# Patient Record
Sex: Female | Born: 1970
Health system: Southern US, Community
[De-identification: ages and names within clinical notes are randomized; demographics above are authoritative.]

## PROBLEM LIST (undated history)

## (undated) DIAGNOSIS — F32A Depression, unspecified: Secondary | ICD-10-CM

## (undated) DIAGNOSIS — F329 Major depressive disorder, single episode, unspecified: Secondary | ICD-10-CM

## (undated) DIAGNOSIS — K589 Irritable bowel syndrome without diarrhea: Secondary | ICD-10-CM

## (undated) DIAGNOSIS — M5136 Other intervertebral disc degeneration, lumbar region: Secondary | ICD-10-CM

## (undated) DIAGNOSIS — R12 Heartburn: Secondary | ICD-10-CM

## (undated) DIAGNOSIS — T7840XA Allergy, unspecified, initial encounter: Secondary | ICD-10-CM

## (undated) DIAGNOSIS — M1990 Unspecified osteoarthritis, unspecified site: Secondary | ICD-10-CM

## (undated) DIAGNOSIS — I10 Essential (primary) hypertension: Secondary | ICD-10-CM

## (undated) DIAGNOSIS — F419 Anxiety disorder, unspecified: Secondary | ICD-10-CM

## (undated) DIAGNOSIS — E785 Hyperlipidemia, unspecified: Secondary | ICD-10-CM

## (undated) HISTORY — DX: Allergy, unspecified, initial encounter: T78.40XA

## (undated) HISTORY — PX: TUBAL LIGATION: SHX77

## (undated) HISTORY — DX: Irritable bowel syndrome, unspecified: K58.9

## (undated) HISTORY — DX: Unspecified osteoarthritis, unspecified site: M19.90

## (undated) HISTORY — DX: Depression, unspecified: F32.A

## (undated) HISTORY — DX: Anxiety disorder, unspecified: F41.9

## (undated) HISTORY — DX: Heartburn: R12

## (undated) HISTORY — DX: Hyperlipidemia, unspecified: E78.5

## (undated) HISTORY — DX: Other intervertebral disc degeneration, lumbar region: M51.36

---
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
## 1997-06-23 ENCOUNTER — Other Ambulatory Visit: Admission: RE | Admit: 1997-06-23 | Discharge: 1997-06-23 | Payer: Self-pay | Admitting: Family Medicine

## 1998-05-22 ENCOUNTER — Other Ambulatory Visit: Admission: RE | Admit: 1998-05-22 | Discharge: 1998-05-22 | Payer: Self-pay | Admitting: Obstetrics and Gynecology

## 1998-09-26 ENCOUNTER — Inpatient Hospital Stay (HOSPITAL_COMMUNITY): Admission: AD | Admit: 1998-09-26 | Discharge: 1998-09-26 | Payer: Self-pay | Admitting: Obstetrics & Gynecology

## 1999-03-20 ENCOUNTER — Inpatient Hospital Stay (HOSPITAL_COMMUNITY): Admission: AD | Admit: 1999-03-20 | Discharge: 1999-03-22 | Payer: Self-pay | Admitting: Obstetrics and Gynecology

## 1999-06-11 ENCOUNTER — Other Ambulatory Visit: Admission: RE | Admit: 1999-06-11 | Discharge: 1999-06-11 | Payer: Self-pay | Admitting: Obstetrics and Gynecology

## 1999-10-02 ENCOUNTER — Other Ambulatory Visit: Admission: RE | Admit: 1999-10-02 | Discharge: 1999-10-02 | Payer: Self-pay | Admitting: Obstetrics and Gynecology

## 2000-09-23 ENCOUNTER — Other Ambulatory Visit: Admission: RE | Admit: 2000-09-23 | Discharge: 2000-09-23 | Payer: Self-pay | Admitting: Obstetrics and Gynecology

## 2001-04-30 ENCOUNTER — Observation Stay (HOSPITAL_COMMUNITY): Admission: AD | Admit: 2001-04-30 | Discharge: 2001-04-30 | Payer: Self-pay | Admitting: Obstetrics and Gynecology

## 2001-07-14 ENCOUNTER — Ambulatory Visit (HOSPITAL_COMMUNITY): Admission: RE | Admit: 2001-07-14 | Discharge: 2001-07-14 | Payer: Self-pay | Admitting: Obstetrics and Gynecology

## 2001-07-14 ENCOUNTER — Encounter: Payer: Self-pay | Admitting: Obstetrics and Gynecology

## 2001-08-03 ENCOUNTER — Encounter: Payer: Self-pay | Admitting: Obstetrics and Gynecology

## 2001-08-03 ENCOUNTER — Ambulatory Visit (HOSPITAL_COMMUNITY): Admission: RE | Admit: 2001-08-03 | Discharge: 2001-08-03 | Payer: Self-pay | Admitting: Obstetrics and Gynecology

## 2001-09-09 ENCOUNTER — Ambulatory Visit (HOSPITAL_COMMUNITY): Admission: RE | Admit: 2001-09-09 | Discharge: 2001-09-09 | Payer: Self-pay | Admitting: Obstetrics and Gynecology

## 2001-09-09 ENCOUNTER — Encounter: Payer: Self-pay | Admitting: Obstetrics and Gynecology

## 2001-10-05 ENCOUNTER — Encounter: Payer: Self-pay | Admitting: Obstetrics and Gynecology

## 2001-10-05 ENCOUNTER — Ambulatory Visit (HOSPITAL_COMMUNITY): Admission: RE | Admit: 2001-10-05 | Discharge: 2001-10-05 | Payer: Self-pay | Admitting: Obstetrics and Gynecology

## 2001-10-19 ENCOUNTER — Inpatient Hospital Stay (HOSPITAL_COMMUNITY): Admission: AD | Admit: 2001-10-19 | Discharge: 2001-10-19 | Payer: Self-pay | Admitting: Obstetrics and Gynecology

## 2001-10-22 ENCOUNTER — Encounter: Payer: Self-pay | Admitting: Obstetrics and Gynecology

## 2001-10-22 ENCOUNTER — Ambulatory Visit (HOSPITAL_COMMUNITY): Admission: RE | Admit: 2001-10-22 | Discharge: 2001-10-22 | Payer: Self-pay | Admitting: Obstetrics and Gynecology

## 2001-10-24 ENCOUNTER — Inpatient Hospital Stay (HOSPITAL_COMMUNITY): Admission: AD | Admit: 2001-10-24 | Discharge: 2001-10-24 | Payer: Self-pay | Admitting: Obstetrics and Gynecology

## 2001-10-27 ENCOUNTER — Observation Stay (HOSPITAL_COMMUNITY): Admission: AD | Admit: 2001-10-27 | Discharge: 2001-10-28 | Payer: Self-pay | Admitting: Obstetrics and Gynecology

## 2001-11-02 ENCOUNTER — Encounter (HOSPITAL_COMMUNITY): Admission: RE | Admit: 2001-11-02 | Discharge: 2001-11-09 | Payer: Self-pay | Admitting: Obstetrics and Gynecology

## 2001-11-06 ENCOUNTER — Encounter: Payer: Self-pay | Admitting: Obstetrics and Gynecology

## 2001-11-14 ENCOUNTER — Inpatient Hospital Stay (HOSPITAL_COMMUNITY): Admission: AD | Admit: 2001-11-14 | Discharge: 2001-11-16 | Payer: Self-pay | Admitting: Obstetrics and Gynecology

## 2001-11-15 ENCOUNTER — Encounter (INDEPENDENT_AMBULATORY_CARE_PROVIDER_SITE_OTHER): Payer: Self-pay

## 2002-03-22 ENCOUNTER — Other Ambulatory Visit: Admission: RE | Admit: 2002-03-22 | Discharge: 2002-03-22 | Payer: Self-pay | Admitting: Obstetrics and Gynecology

## 2005-05-06 ENCOUNTER — Other Ambulatory Visit: Admission: RE | Admit: 2005-05-06 | Discharge: 2005-05-06 | Payer: Self-pay | Admitting: Obstetrics and Gynecology

## 2007-12-16 ENCOUNTER — Encounter: Admission: RE | Admit: 2007-12-16 | Discharge: 2007-12-16 | Payer: Self-pay | Admitting: Rheumatology

## 2009-02-18 DIAGNOSIS — M51369 Other intervertebral disc degeneration, lumbar region without mention of lumbar back pain or lower extremity pain: Secondary | ICD-10-CM

## 2009-02-18 DIAGNOSIS — M5136 Other intervertebral disc degeneration, lumbar region: Secondary | ICD-10-CM

## 2009-02-18 HISTORY — DX: Other intervertebral disc degeneration, lumbar region: M51.36

## 2009-02-18 HISTORY — DX: Other intervertebral disc degeneration, lumbar region without mention of lumbar back pain or lower extremity pain: M51.369

## 2010-07-06 NOTE — Op Note (Signed)
NAME:  Brandy [name], Brandy [name]                       ACCOUNT NO.:  [id number]   MEDICAL RECORD NO.:  [id number]                   PATIENT TYPE:  INP   LOCATION:  9138                                 FACILITY:  [facility]   PHYSICIAN:  Janine Limbo, M.D.            DATE OF BIRTH:  04-05-1970   DATE OF PROCEDURE:  11/14/2001  DATE OF DISCHARGE:                                 OPERATIVE REPORT   PREOPERATIVE DIAGNOSES:  1. Thirty-eight weeks' gestation.  2. Twin gestation.   POSTOPERATIVE DIAGNOSES:  1. Thirty-eight weeks' gestation.  2. Twin gestation.   PROCEDURE:  Normal spontaneous vaginal delivery of twins over an intact  perineum.   OBSTETRICIAN:  Janine Limbo, M.D.   ANESTHESIA:  IV Stadol.   DISPOSITION:  The patient is a 40 year old female, gravida 2, para 1-0-0-1,  who presents at 34 weeks' gestation with twins.  She has dilated her cervix  quickly and she is now brought to the operating room for twin vaginal  delivery.   FINDINGS:  The first infant born was a 6-pound 3-ounce female infant (Tyrese).  The Apgars were 8 at one minute and 9 at five minutes.  The infant was in an  occiput anterior presentation.  The second infant born was a 6-pound 6-ounce  female infant Essie Hart).  The Apgars were 8 at one minute and 9 at five  minutes.  This infant was in an occiput posterior position.  The umbilical  cord for both infants had three vessels.  There was one large placenta  present.  There was a membrane present between the infants.   PROCEDURE:  The patient was taken to the operating room where she was placed  in a lithotomy position.  The perineum and vagina were prepped with multiple  layers of Betadine and then sterilely draped.  The patient quickly delivered  the first infant.  The cord was clamped and cut and the infant was handed to  the waiting pediatric team.  Routine cord blood studies were obtained.  The  operator then guided the second fetal head into the  pelvis.  The membranes  were ruptured and the fluid was noted to be clear.  The patient then quickly  delivered the second infant without difficulty.  The cord was clamped and  cut and again, the infant was handed to the waiting pediatric team.  Routine  cord blood studies were obtained.  The placenta was removed without  difficulty.  The perineum and vagina were checked for lacerations and no  lacerations were present.  The patient did have brisk bleeding and fundal  massage was required in addition to IV Pitocin.  The estimated blood loss  was 800 cc.  The patient tolerated her procedure well.  She was taken back  to labor and delivery in stable condition.  The infants were taken to the  full term nursery in stable condition.  Janine Limbo, M.D.   AVS/MEDQ  D:  11/15/2001  T:  11/15/2001  Job:  [id number]

## 2010-07-06 NOTE — Discharge Summary (Signed)
   NAME:  Brandy [name], Brandy [name]                    ACCOUNT NO.:  [id number]   MEDICAL RECORD NO.:  [id number]                   PATIENT TYPE:  INP   LOCATION:  9163                                 [facility]ILITY:  [facility]   PHYSICIAN:  Janine Limbo, M.D.            DATE OF BIRTH:  December 30, 1970   DATE OF ADMISSION:  10/27/2001  DATE OF DISCHARGE:  10/28/2001                                 DISCHARGE SUMMARY   ADMITTING DIAGNOSES:  Intrauterine pregnancy twin gestation at 70 and 3/7  weeks, questionable early labor.   DISCHARGE DIAGNOSES:  Intrauterine pregnancy with twins at 54 and 4/7 weeks,  preterm contractions with no cervical change.   HISTORY OF PRESENT ILLNESS:  The patient is a 40 year old gravida 2, para 1-  0-0-1 who presented at 40 and 3/7 weeks with twin gestation and contractions  every three to four minutes for several hours.  Her cervix at that time was  noted to be 3-4 cm dilated and she was admitted for observation for labor.  Her babies have been reactive and reassuring twin A and twin B.  Her vital  signs have been stable.  Her contractions have slowed down and now are mild  and irregular.  Cervical examination per Dr. Stefano Gaul this morning was 3,  50%, -3 and as patient is not laboring and babies look reassuring, she is to  be discharged home.  The patient will follow up with her biweekly NSTs on  Friday and return to the office on Monday for a visit.  She will call for  any signs or symptoms of labor.     [name], C.N.M.               Janine Limbo, M.D.    SDM/MEDQ  D:  10/28/2001  T:  10/28/2001  Job:  [id number]

## 2010-07-06 NOTE — H&P (Signed)
NAME:  Brandy [name], Brandy [name]                       ACCOUNT NO.:  [id number]   MEDICAL RECORD NO.:  [id number]                   PATIENT TYPE:  OUT   LOCATION:  ULT                                  [facility]   PHYSICIAN:  Janine Limbo, M.D.            DATE OF BIRTH:  24-Jan-1971   DATE OF ADMISSION:  11/14/2001  DATE OF DISCHARGE:                                HISTORY & PHYSICAL   HISTORY OF PRESENT ILLNESS:  The patient is a 40 year old female gravida 2,  para 1-0-0-1 who presents at [redacted] weeks gestation (EDC is November 29, 2001)  for induction of labor.  The patient has been followed at [facility] and Gynecology for this pregnancy that has been complicated by a  twin gestation.  She complains of severe pelvic pressure and contractions.  She wants to proceed with induction.  The patient's most recent ultrasound  shows that the presenting infant is in a vertex presentation and the second  infant is in a transverse to breech presentation.   OBSTETRICAL HISTORY:  The patient had a vaginal delivery in 2001 of an 8  pound 1 ounce female infant at term.   ALLERGIES:  None known.   PAST MEDICAL HISTORY:  The patient denies hypertension and diabetes.  She  had her wisdom teeth removed in the past.   SOCIAL HISTORY:  The patient denied cigarette use, alcohol use, and  recreational drug use.   REVIEW OF SYMPTOMS:  Normal pregnancy complaints.   FAMILY HISTORY:  Noncontributory for this admission.   PHYSICAL EXAMINATION:  VITAL SIGNS:  Weight 175 pounds.  HEENT:  Within normal limits.  CHEST:  Clear.  HEART:  Regular rate and rhythm.  BREASTS:  Without masses.  ABDOMEN:  Gravid with a fundal height of 43 cm.  EXTREMITIES:  Within normal limits.  NEUROLOGIC:  Grossly normal.  PELVIC:  Cervix is 3 cm dilated, 90% effaced, and -1 in station.   LABORATORY VALUES:  Blood type O+.  Antibody screen negative.  VDRL  nonreactive.  Rubella immune.  HBSAG negative.   HIV nonreactive.  GC  negative.  Chlamydia negative.  Pap within normal limits.  Glucola screen is  92.  Third trimester beta Strep is negative.   ASSESSMENT:  1. A [redacted] week gestation.  2. Twin gestation with a vertex breech presentation.  3. Maternal pain in contractions.   PLAN:  The patient will be admitted for induction of labor.  The patient  understands the indications for her surgical procedure and she accepts the  risks of, but not limited to, anesthetic complications, bleeding,  infections, and possible damage to the surrounding organs.  She understands  that she may, in fact, be increasing her risk for cesarean delivery by  insisting upon induction of labor at this time.  She also understands that  there is a small risk that she will have a vaginal delivery and then  a  cesarean section for the delivery of her second infant.  She also  understands the increased risk of complications for her infant associated  with a potential breech vaginal delivery.                                               Janine Limbo, M.D.    AVS/MEDQ  D:  11/12/2001  T:  11/12/2001  Job:  [id number]

## 2010-07-06 NOTE — H&P (Signed)
[facility]  Patient:    Brandy [name]                     MRN: [id number] Adm. Date:  [id number] Attending:  Cleatrice Burke Dictator:   Vance Gather Duplantis, C.N.M.                         History and Physical  HISTORY OF PRESENT ILLNESS:   Ms. Brandy [name] is a 40 year old married black female, gravida 1, para 0, at 38-1/[redacted] weeks gestation who presents complaining of uterine contractions every four to five minutes for the last couple of hours.  She was een in the office earlier today and at that point was 3 cm, and 70% effaced, vertex at -2 station, with intact membranes and with contractions about every 10 minutes.  She had reported a small amount of bloody show this morning.  She denies any leaking or heavy vaginal bleeding.  She reports positive fetal movement.  She denies any nausea, vomiting, headaches, or visual disturbances.  Her pregnancy as been followed at [facility] and Gynecology by the M.D. service and has been essentially uncomplicated.  Her Group B Strep is negative.  OB/GYN HISTORY:               She is a primigravida.  She used Ortho-Tricyclen nd stopped that in April of 2000.  ALLERGIES:                    No known drug allergies.  PAST MEDICAL HISTORY:         She reports having had the usual childhood diseases. She reports having her wisdom teeth removed, but no other surgery.  FAMILY HISTORY:               Significant for maternal grandmother with hypertension requiring medications.  Maternal grandfather with colon cancer, maternal aunt with breast cancer.  GENETIC HISTORY:              Negative.  SOCIAL HISTORY:               She is married to Brandy [name] who is involved nd supportive.  They are both employed fulltime.  They deny any illicit drug use, alcohol, or smoking with this pregnancy.  PRENATAL LABORATORY DATA:     Her blood type is O positive, antibody screen is negative, syphilis is  negative, sickle cell is negative, hepatitis B surface antigen is negative, HIV nonreactive, GC and Chlamydia are both negative, and her rubella is positive.  Her 36-week Beta Strep was negative and a one-hour Glucola was also negative.  PHYSICAL EXAMINATION:  VITAL SIGNS:                  Her vital signs are stable.  She is afebrile.  HEENT:                        Within normal limits.  HEART:                        Regular rate and rhythm.  CHEST:                        Clear.  BREASTS:                      Soft and  nontender.  ABDOMEN:                      Gravid with uterine contractions every four to five minutes.  Her fetal heart rate is reassuring.  PELVIC:                       Her cervix is 4 cm, 80%, and vertex -1 with intact membranes.  EXTREMITIES:                  Within normal limits.  ASSESSMENT:                   1. Intrauterine pregnancy at term.                               2. Early active labor.                               3. Negative Group B Strep.  PLAN:                         To admit to labor and delivery to follow routine .D. orders and Cecilio Asper, M.D. has been notified of admission and will  follow the patient. DD:  03/20/99 TD:  03/20/99 Job: [id number]

## 2010-07-06 NOTE — Discharge Summary (Signed)
   Brandy [name], Brandy [name]                       ACCOUNT NO.:  [id number]   MEDICAL RECORD NO.:  [id number]                   PATIENT TYPE:  INP   LOCATION:  9138                                 [facility]   PHYSICIAN:  Janine Limbo, M.D.            DATE OF BIRTH:  Mar 29, 1970   DATE OF ADMISSION:  11/14/2001  DATE OF DISCHARGE:  11/16/2001                                 DISCHARGE SUMMARY   ADMISSION DIAGNOSES:  1. Intrauterine pregnancy with twins at [redacted] weeks gestation.  2. Desires permanent sterilization.   DISCHARGE DIAGNOSES:  1. Intrauterine pregnancy with twins at [redacted] weeks gestation.  2. Desires permanent sterilization.   HOSPITAL PROCEDURES:  1. Spinal anesthesia.  2. Spontaneous vaginal delivery of a twin gestation with twin A female named     Tyrese weighing 6 pounds 3 ounces, Apgars 8 and 9; and twin B female     named Tyrick weighing 6 pounds 6 ounces, Apgars 8 and 9.  3. Elective bilateral tubal ligation.   HOSPITAL COURSE:  The patient was admitted for induction of labor with term  twins.  The patient progressed to 4 cm later on that day with reassuring  fetal heart rates.  Amniotomy was performed, internal monitors were applied,  and within two hours the patient was delivered vaginally of twin boys over  an intact perineum with no complications.  Both infants did well and were  taken to the well nursery.  Postoperative day #1 the patient was doing well,  hemoglobin was 9.2, and she was desiring elective sterilization and was  taken to the operating room for a bilateral tubal ligation which was  performed under spinal anesthesia by Dr. Stefano Gaul with no complications on  postpartum day #2 and postoperative day #1.  The patient was doing well,  vital signs were stable, she was afebrile.  Chest was clear to auscultation,  heart rate regular rate and rhythm, abdomen was soft and appropriately  tender.  Umbilical incision was clean and intact.  Lochia was small.   The  perineum was intact, extremities within normal limits.  She was deemed to  have received the full benefit of her hospital stay and was discharged home.   DISCHARGE MEDICATIONS:  1. Motrin 600 mg p.o. q.6h. p.r.n.  2. Tylox one to two p.o. q.4h. p.r.n.  3. Prenatal vitamins one p.o. q.d.   DISCHARGE LABORATORY DATA:  White blood cell count 13.3, hemoglobin 9.2.,  hematocrit 28, platelets 274.  RPR nonreactive.   DISCHARGE INSTRUCTIONS:  Per [facility] handout.   DISCHARGE FOLLOW-UP:  In six weeks or p.r.n.    Marie L. Williams, C.N.M.                 Janine Limbo, M.D.   MLW/MEDQ  D:  11/16/2001  T:  11/17/2001  Job:  [id number]

## 2010-07-06 NOTE — H&P (Signed)
NAME:  Brandy, [name]                       ACCOUNT NO.:  [id number]   MEDICAL RECORD NO.:  [id number]                   PATIENT TYPE:  INP   LOCATION:  9163                                 [facility]   PHYSICIAN:  Hal Morales, M.D.             DATE OF BIRTH:  [birth year]   DATE OF ADMISSION:  10/27/2001  DATE OF DISCHARGE:                                HISTORY & PHYSICAL   HISTORY OF PRESENT ILLNESS:  The patient is a 40 year old gravida 2, para 1-  0-0-1, at 35-3/7 weeks with twin gestation who presented with uterine  contractions every three to four minutes for several hours and low back pain  with nausea and vomiting.  She was seen in maternity admission unit on  October 24, 2001.  Cervix was 1+, 70%, and vertex at -1.  She had an  ultrasound at 34 weeks that showed vertex/vertex presentation with twin B  being the presenting twin.  Both twins had growth in the 25th to 50th  percentile at that time.  The patient presented today for questionable  labor.  She was observed with initially cervix 3 to 4 cm, very posterior,  80%, very soft, vertex -1 with bulging bag of water.  Her contractions  spaced out significantly after her evaluation in maternity admission unit.  She then was walked for approximately 1 to 1-1/2 hours.  The cervix then  changed minimally to 4 cm, slightly more anterior, but contractions remained  6 to 8 minutes apart.  The decision was made after consultation with Dr.  Stefano Gaul and Dr. Pennie Rushing to admit the patient for observation and to observe  for status of labor as the night progresses.  Pregnancy has been remarkable  for:  1) Twin gestation.  2) Questionable LMP.  3) Elevated down syndrome  risk on AFP with amniocentesis declined.  4) Desires tubal sterilization.   PRENATAL LABORATORY DATA:  Blood type is O positive, Rh antibody negative,  VDRL nonreactive, rubella titer positive, hepatitis B surface antigen  negative, HIV nonreactive, GC and  Chlamydia cultures were negative, Pap was  normal.  Glucose Challenge was normal.  Hemoglobin upon entry into practice  was 13.7, it was 9.8 at 27 weeks.  Group B Strep culture was negative last  week.  She did have an elevated down syndrome risk on AFP but declined  amniocentesis.  EDC of November 29, 2001, was established by last menstrual  period, although, it was uncertain and was in agreement with ultrasound at  approximately 18 weeks.   HISTORY OF PRESENT PREGNANCY:  The patient entered care at approximately 8  weeks.  She had significant nausea in the early part of her pregnancy.  She  had an AFP at 16-5/7 weeks which was elevated down syndrome risk.  She had  an ultrasound in May that showed twin gestation.  Her cervix was normal.  They are anticipating a diamniotic monochorionic twins.  She declined  amniocentesis following an elevated down syndrome risk.  Twin A which is on  the right had an intracardiac echogenic focus on ultrasound.  She was noted  to have mild anemia at 27 weeks.  She had an ultrasound at approximately 28  weeks that showed normal growth in the 50th to 75th percentile in both twins  and an echogenic intracardiac focus on both twins.  She elected to have a  tubal ligation with that decision made at approximately 31 weeks.  She had  an ultrasound at 32 weeks that showed normal growth.  The cervix was 2.2 cm.  Fetal fibronectin was done at that time that was negative.  She was seen in  maternity admission unit on Saturday for a labor check.  Cervix was 1+, 70%,  and vertex at -1 station.  The decision was made at that point to not try to  stop labor should it occur.   PAST OBSTETRICAL HISTORY:  In January of 2001, she had a vaginal birth of a  female infant weight 8 pounds 1 ounce at 38 weeks.  She was in labor 12  hours.  She had Stadol.  She had no complications.   PAST MEDICAL HISTORY:  She was on OCPs in the past.  She has occasional  yeast infections.  She  reports usual childhood illnesses.  Her only other  surgery was wisdom teeth and only other hospitalization was for childbirth  and at age 78 she was hospitalized for possible ulcer.   ALLERGIES:  No known drug allergies.   FAMILY HISTORY:  Maternal grandmother, maternal aunt, and maternal uncle  have chronic hypertension. Her sister has anemia.  Her mother has some type  of thyroid problem. Her maternal grandfather had colon cancer.  Her maternal  aunt had breast cancer.   GENETIC HISTORY:  Unremarkable.   SOCIAL HISTORY:  The patient is married to the father of the baby.  He is  involved and supportive.  His name is Olive Bass.  The patient is  African-American and of the [facility] faith.  She has one year of college.  She was employed as a Associate Professor.  Her husband has Scientist, product/process development school  education and he is employed as a Naval architect.  She has been followed by  the physician service at [facility].  She denies any alcohol, drug,  or tobacco use during this pregnancy.   PHYSICAL EXAMINATION:  VITAL SIGNS:  Stable, the patient is afebrile.  HEENT:  Within normal limits.  LUNGS:  Bilateral breath sounds are clear.  HEART:  Regular rate and rhythm without murmur.  BREASTS:  Soft and nontender.  ABDOMEN:  Fundal height is approximately 38 cm.  Twin B is the presenting  twin in vertex presentation.  Twin A is also in a vertex presentation on the  maternal right with twin B being on the maternal left.  This was verified by  a bedside ultrasound.  Fetal heart rates are both reactive with no  decelerations.  Uterine contractions at this point are now every 6 to 10  minutes apart, mild to moderate quality.  PELVIC:  Cervical examination now is more anterior, 4 cm, 80%, vertex at -1  station, with slight bulging bag of water.  This is not a major change from  her previous examination, but does report slight change. EXTREMITIES:  Deep tendon reflexes are 2+ without clonus.  There  is trace  edema noted.   IMPRESSION:  1. Intrauterine  pregnancy with twins at 35-3/7 weeks.  2. Moderate status of advanced cervical change.  3. Persistent contractions, but questionable early versus prodromal labor.  4. Negative Group B Strep.   PLAN:  1. Admit to birthing suite for consult with Dr. Stefano Gaul and Dr. Pennie Rushing as     attending physician.  2. Decision is made to admit the patient for observation, IV fluid     hydration, and pain medication with therapeutic sedation should the     patient desire.  3. The patient will be reevaluated through the night on an as-needed basis     and in the morning for further management.  4. The patient is aware that the decision at this point is not to force     labor, but to allow labor to define itself at present given the twin     gestation and 35-3/7 weeks dating.  The patient and her husband are     agreeable with this plan and will accept being observed during the night.     Renaldo Reel Emilee Hero, C.N.M.                   Hal Morales, M.D.    Leeanne Mannan  D:  10/27/2001  T:  10/28/2001  Job:  [id number]

## 2010-07-06 NOTE — Op Note (Signed)
   NAME:  Brandy [name], Brandy [name]                       ACCOUNT NO.:  [id number]   MEDICAL RECORD NO.:  [id number]                   PATIENT TYPE:  INP   LOCATION:  9138                                 FACILITY:  [facility]   PHYSICIAN:  Janine Limbo, M.D.            DATE OF BIRTH:  [birth year]   DATE OF PROCEDURE:  11/15/2001  DATE OF DISCHARGE:                                 OPERATIVE REPORT   PREOPERATIVE DIAGNOSES:  1. Postpartum day #1.  2. Desires permanent sterilization.   POSTOPERATIVE DIAGNOSES:  1. Postpartum day #1.  2. Desires permanent sterilization.   PROCEDURE:  Modified [place] bilateral tubal ligation.   SURGEON:  Janine Limbo, M.D.   ANESTHESIA:  Spinal.   INDICATIONS:  The patient is a 40 year old female, now para 2-0-0-3, who had  a vaginal delivery of twins on November 14, 2001.  Both infants are doing  well.  The patient desires permanent sterilization.  She understands the  indications for her procedure, and she accepts the risks of, but not limited  to, anesthetic complications, bleeding, infections, possible damage to the  surrounding organs, and possible tubal failure (17/1000).   FINDINGS:  The fallopian tubes were normal bilaterally.   DESCRIPTION OF PROCEDURE:  The patient was taken to the operating room where  a spinal anesthetic was given.  The patient's abdomen was prepped with  multiple layers of Betadine and then sterilely draped.  Then, 5 cc of 0.25%  Marcaine with epinephrine were injected in the subumbilical area.  An  incision was made and carried sharply though the subcutaneous tissue, the  fascia, and the anterior peritoneum.  The left fallopian tube was identified  and followed to its fimbriated end.  A knuckle of tube was made on the left  using a free tie and then a tied of suture ligature of 0 plain catgut.  The  knuckle of tube thus made was excised.  Hemostasis was adequate.  The  opposite tube was handled in a similar fashion.   Again, hemostasis was  adequate.  The anterior peritoneum and the fascia were closed using a  running suture of 2-0 Vicryl.  The skin was reapproximated using a  subcuticular suture of 4-0 Vicryl.  Sponge, needle and instrument counts  were correct.  The estimated blood loss was less than 5 cc.  The patient  tolerated her procedure well.  She was taken to the recovery room in stable  condition.                                               Janine Limbo, M.D.    AVS/MEDQ  D:  11/15/2001  T:  11/15/2001  Job:  [id number]

## 2012-02-05 ENCOUNTER — Ambulatory Visit: Payer: Medicare HMO | Admitting: Family Medicine

## 2012-02-05 VITALS — BP 134/88 | HR 76 | Temp 98.5°F | Resp 16 | Ht 67.8 in | Wt 138.8 lb

## 2012-02-05 DIAGNOSIS — R21 Rash and other nonspecific skin eruption: Secondary | ICD-10-CM

## 2012-02-05 DIAGNOSIS — J029 Acute pharyngitis, unspecified: Secondary | ICD-10-CM

## 2012-02-05 LAB — POCT RAPID STREP A (OFFICE): Rapid Strep A Screen: NEGATIVE

## 2012-02-05 MED ORDER — PREDNISONE 20 MG PO TABS: ORAL_TABLET | ORAL | Status: DC

## 2012-02-05 NOTE — Progress Notes (Signed)
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 Throat is scratchy, she does have some flushing  No cough or wheezing, no fever Children 10 and 12  Objective:  NAD Oroph:  Reddened posterior pharynx, strawberry tongue Face:  Mildly swollen periorbital area Neck: no adenop, supple Chest: no rash Heart: Abdomen: faint morbilliform rash Arms:  Faint morbilliform rash on forearms

## 2013-02-09 ENCOUNTER — Ambulatory Visit (INDEPENDENT_AMBULATORY_CARE_PROVIDER_SITE_OTHER): Payer: Managed Care, Other (non HMO) | Admitting: Physician Assistant

## 2013-02-09 VITALS — BP 130/98 | HR 72 | Temp 98.1°F | Resp 16 | Ht 66.5 in | Wt 134.0 lb

## 2013-02-09 DIAGNOSIS — M62838 Other muscle spasm: Secondary | ICD-10-CM

## 2013-02-09 DIAGNOSIS — S46811A Strain of other muscles, fascia and tendons at shoulder and upper arm level, right arm, initial encounter: Secondary | ICD-10-CM

## 2013-02-09 DIAGNOSIS — S43499A Other sprain of unspecified shoulder joint, initial encounter: Secondary | ICD-10-CM

## 2013-02-09 MED ORDER — HYDROCODONE-ACETAMINOPHEN 5-325 MG PO TABS: 1.0000 | ORAL_TABLET | Freq: Four times a day (QID) | ORAL | Status: DC | PRN

## 2013-02-09 MED ORDER — DICLOFENAC SODIUM 75 MG PO TBEC: 75.0000 mg | DELAYED_RELEASE_TABLET | Freq: Two times a day (BID) | ORAL | Status: DC

## 2013-02-09 MED ORDER — CYCLOBENZAPRINE HCL 5 MG PO TABS: 5.0000 mg | ORAL_TABLET | Freq: Three times a day (TID) | ORAL | Status: DC | PRN

## 2013-02-09 NOTE — Patient Instructions (Signed)
Heat and gentle massage.

## 2013-02-09 NOTE — Progress Notes (Signed)
   Subjective:    Patient ID: Brandy [name], female    DOB: [birth year], 42 y.o.   MRN: [id number]  HPI Pt presents to clinic with right neck pain that she has had for 4 days and it seems to be getting worse.  She has increased pain with movement of neck and right arm.  She has a burning sensation in her right trapezius area.  She did not have an injury that she knows of.  She has never had neck/shoulder pain before.  She has tried a few doses of Motrin from 600-1200mg  and that does not seem to help that much.  She is not sleeping well because of the pain.  She has no weakness or paresthesias in her right arm.  Review of Systems     Objective:   Physical Exam  Vitals reviewed. Constitutional: She is oriented to person, place, and time. She appears well-developed and well-nourished.  HENT:  Head: Normocephalic and atraumatic.  Right Ear: External ear normal.  Left Ear: External ear normal.  Neck: Neck supple. Muscular tenderness (trapezius and sternocleidomastoid muscles on the right side.) present. No spinous process tenderness present. No rigidity. Decreased range of motion (slightly decrease flexion due to pain) present.  Pulmonary/Chest: Effort normal.  Abdominal: Bowel sounds are normal.  Neurological: She is alert and oriented to person, place, and time. She has normal strength. No sensory deficit.  Skin: Skin is warm and dry.  Psychiatric: She has a normal mood and affect. Her behavior is normal. Judgment and thought content normal.       Assessment & Plan:  Muscle spasms of neck - Plan: cyclobenzaprine (FLEXERIL) 5 MG tablet  Trapezius strain, right, initial encounter - Plan: diclofenac (VOLTAREN) 75 MG EC tablet, HYDROcodone-acetaminophen (NORCO/VICODIN) 5-325 MG per tablet  Heat to the area and gentle massage may help.  Benny Lennert PA-C 02/09/2013 12:30 PM

## 2013-10-13 ENCOUNTER — Encounter: Payer: Self-pay | Admitting: Certified Nurse Midwife

## 2013-10-13 ENCOUNTER — Ambulatory Visit (INDEPENDENT_AMBULATORY_CARE_PROVIDER_SITE_OTHER): Payer: Managed Care, Other (non HMO) | Admitting: Certified Nurse Midwife

## 2013-10-13 VITALS — BP 120/70 | HR 74 | Resp 16 | Ht 66.5 in | Wt 145.0 lb

## 2013-10-13 DIAGNOSIS — B3731 Acute candidiasis of vulva and vagina: Secondary | ICD-10-CM

## 2013-10-13 DIAGNOSIS — Z124 Encounter for screening for malignant neoplasm of cervix: Secondary | ICD-10-CM

## 2013-10-13 DIAGNOSIS — N760 Acute vaginitis: Secondary | ICD-10-CM

## 2013-10-13 DIAGNOSIS — Z01419 Encounter for gynecological examination (general) (routine) without abnormal findings: Secondary | ICD-10-CM

## 2013-10-13 DIAGNOSIS — B373 Candidiasis of vulva and vagina: Secondary | ICD-10-CM

## 2013-10-13 DIAGNOSIS — Z0000 Encounter for general adult medical examination without abnormal findings: Secondary | ICD-10-CM

## 2013-10-13 LAB — POCT URINALYSIS DIPSTICK
Bilirubin, UA: NEGATIVE
Glucose, UA: NEGATIVE
Ketones, UA: NEGATIVE
Nitrite, UA: NEGATIVE
Protein, UA: NEGATIVE
Urobilinogen, UA: NEGATIVE
pH, UA: 5

## 2013-10-13 MED ORDER — FLUCONAZOLE 150 MG PO TABS: ORAL_TABLET | ORAL | Status: DC

## 2013-10-13 MED ORDER — METRONIDAZOLE 0.75 % VA GEL: 1.0000 | Freq: Two times a day (BID) | VAGINAL | Status: DC

## 2013-10-13 NOTE — Patient Instructions (Signed)
Monilial Vaginitis Vaginitis in a soreness, swelling and redness (inflammation) of the vagina and vulva. Monilial vaginitis is not a sexually transmitted infection. CAUSES  Yeast vaginitis is caused by yeast (candida) that is normally found in your vagina. With a yeast infection, the candida has overgrown in number to a point that upsets the chemical balance. SYMPTOMS   White, thick vaginal discharge.  Swelling, itching, redness and irritation of the vagina and possibly the lips of the vagina (vulva).  Burning or painful urination.  Painful intercourse. DIAGNOSIS  Things that may contribute to monilial vaginitis are:  Postmenopausal and virginal states.  Pregnancy.  Infections.  Being tired, sick or stressed, especially if you had monilial vaginitis in the past.  Diabetes. Good control will help lower the chance.  Birth control pills.  Tight fitting garments.  Using bubble bath, feminine sprays, douches or deodorant tampons.  Taking certain medications that kill germs (antibiotics).  Sporadic recurrence can occur if you become ill. TREATMENT  Your caregiver will give you medication.  There are several kinds of anti monilial vaginal creams and suppositories specific for monilial vaginitis. For recurrent yeast infections, use a suppository or cream in the vagina 2 times a week, or as directed.  Anti-monilial or steroid cream for the itching or irritation of the vulva may also be used. Get your caregiver's permission.  Painting the vagina with methylene blue solution may help if the monilial cream does not work.  Eating yogurt may help prevent monilial vaginitis. HOME CARE INSTRUCTIONS   Finish all medication as prescribed.  Do not have sex until treatment is completed or after your caregiver tells you it is okay.  Take warm sitz baths.  Do not douche.  Do not use tampons, especially scented ones.  Wear cotton underwear.  Avoid tight pants and panty  hose.  Tell your sexual partner that you have a yeast infection. They should go to their caregiver if they have symptoms such as mild rash or itching.  Your sexual partner should be treated as well if your infection is difficult to eliminate.  Practice safer sex. Use condoms.  Some vaginal medications cause latex condoms to fail. Vaginal medications that harm condoms are:  Cleocin cream.  Butoconazole (Femstat).  Terconazole (Terazol) vaginal suppository.  Miconazole (Monistat) (may be purchased over the counter). SEEK MEDICAL CARE IF:   You have a temperature by mouth above 102 F (38.9 C).  The infection is getting worse after 2 days of treatment.  The infection is not getting better after 3 days of treatment.  You develop blisters in or around your vagina.  You develop vaginal bleeding, and it is not your menstrual period.  You have pain when you urinate.  You develop intestinal problems.  You have pain with sexual intercourse. Document Released: 11/14/2004 Document Revised: 04/29/2011 Document Reviewed: 07/29/2008 Exit[employer] Patient Information 2015 [employer]. This information is not intended to replace advice given to you by your health care provider. Make sure you discuss any questions you have with your health care provider. Bacterial Vaginosis Bacterial vaginosis is a vaginal infection that occurs when the normal balance of bacteria in the vagina is disrupted. It results from an overgrowth of certain bacteria. This is the most common vaginal infection in women of childbearing age. Treatment is important to prevent complications, especially in pregnant women, as it can cause a premature delivery. CAUSES  Bacterial vaginosis is caused by an increase in harmful bacteria that are normally present in smaller amounts in the   vagina. Several different kinds of bacteria can cause bacterial vaginosis. However, the reason that the condition develops is not fully  understood. RISK FACTORS Certain activities or behaviors can put you at an increased risk of developing bacterial vaginosis, including:  Having a new sex partner or multiple sex partners.  Douching.  Using an intrauterine device (IUD) for contraception. Women do not get bacterial vaginosis from toilet seats, bedding, swimming pools, or contact with objects around them. SIGNS AND SYMPTOMS  Some women with bacterial vaginosis have no signs or symptoms. Common symptoms include:  Grey vaginal discharge.  A fishlike odor with discharge, especially after sexual intercourse.  Itching or burning of the vagina and vulva.  Burning or pain with urination. DIAGNOSIS  Your health care provider will take a medical history and examine the vagina for signs of bacterial vaginosis. A sample of vaginal fluid may be taken. Your health care provider will look at this sample under a microscope to check for bacteria and abnormal cells. A vaginal pH test may also be done.  TREATMENT  Bacterial vaginosis may be treated with antibiotic medicines. These may be given in the form of a pill or a vaginal cream. A second round of antibiotics may be prescribed if the condition comes back after treatment.  HOME CARE INSTRUCTIONS   Only take over-the-counter or prescription medicines as directed by your health care provider.  If antibiotic medicine was prescribed, take it as directed. Make sure you finish it even if you start to feel better.  Do not have sex until treatment is completed.  Tell all sexual partners that you have a vaginal infection. They should see their health care provider and be treated if they have problems, such as a mild rash or itching.  Practice safe sex by using condoms and only having one sex partner. SEEK MEDICAL CARE IF:   Your symptoms are not improving after 3 days of treatment.  You have increased discharge or pain.  You have a fever. MAKE SURE YOU:   Understand these  instructions.  Will watch your condition.  Will get help right away if you are not doing well or get worse. FOR MORE INFORMATION  Centers for Disease Control and Prevention, Division of STD Prevention: AppraiserFraud.fi American Sexual Health Association (ASHA): www.ashastd.org  Document Released: 02/04/2005 Document Revised: 11/25/2012 Document Reviewed: 09/16/2012 [facility] Patient Information 2015 [place], [place]. This information is not intended to replace advice given to you by your health care provider. Make sure you discuss any questions you have with your health care provider.  EXERCISE AND DIET:  We recommended that you start or continue a regular exercise program for good health. Regular exercise means any activity that makes your heart beat faster and makes you sweat.  We recommend exercising at least 30 minutes per day at least 3 days a week, preferably 4 or 5.  We also recommend a diet low in fat and sugar.  Inactivity, poor dietary choices and obesity can cause diabetes, heart attack, stroke, and kidney damage, among others.    ALCOHOL AND SMOKING:  Women should limit their alcohol intake to no more than 7 drinks/beers/glasses of wine (combined, not each!) per week. Moderation of alcohol intake to this level decreases your risk of breast cancer and liver damage. And of course, no recreational drugs are part of a healthy lifestyle.  And absolutely no smoking or even second hand smoke. Most people know smoking can cause heart and lung diseases, but did you know it also  contributes to weakening of your bones? Aging of your skin?  Yellowing of your teeth and nails?  CALCIUM AND VITAMIN D:  Adequate intake of calcium and Vitamin D are recommended.  The recommendations for exact amounts of these supplements seem to change often, but generally speaking 600 mg of calcium (either carbonate or citrate) and 800 units of Vitamin D per day seems prudent. Certain women may benefit from higher intake of  Vitamin D.  If you are among these women, your doctor will have told you during your visit.    PAP SMEARS:  Pap smears, to check for cervical cancer or precancers,  have traditionally been done yearly, although recent scientific advances have shown that most women can have pap smears less often.  However, every woman still should have a physical exam from her gynecologist every year. It will include a breast check, inspection of the vulva and vagina to check for abnormal growths or skin changes, a visual exam of the cervix, and then an exam to evaluate the size and shape of the uterus and ovaries.  And after 43 years of age, a rectal exam is indicated to check for rectal cancers. We will also provide age appropriate advice regarding health maintenance, like when you should have certain vaccines, screening for sexually transmitted diseases, bone density testing, colonoscopy, mammograms, etc.   MAMMOGRAMS:  All women over 30 years old should have a yearly mammogram. Many facilities now offer a "3D" mammogram, which may cost around $50 extra out of pocket. If possible,  we recommend you accept the option to have the 3D mammogram performed.  It both reduces the number of women who will be called back for extra views which then turn out to be normal, and it is better than the routine mammogram at detecting truly abnormal areas.    COLONOSCOPY:  Colonoscopy to screen for colon cancer is recommended for all women at age 79.  We know, you hate the idea of the prep.  We agree, BUT, having colon cancer and not knowing it is worse!!  Colon cancer so often starts as a polyp that can be seen and removed at colonscopy, which can quite literally save your life!  And if your first colonoscopy is normal and you have no family history of colon cancer, most women don't have to have it again for 10 years.  Once every ten years, you can do something that may end up saving your life, right?  We will be happy to help you get it  scheduled when you are ready.  Be sure to check your insurance coverage so you understand how much it will cost.  It may be covered as a preventative service at no cost, but you should check your particular policy.     It was very nice to meet you today! Debbi

## 2013-10-13 NOTE — Progress Notes (Signed)
43 y.o. G42P2003 Divorced African American Fe here to establish gyn care and for annual exam. Periods normal, no issues. Patient feels she has another BV infection. History of chronic BV for the past two years. Patient works on diet to maintain normal ph. Please check today. Patient desires STD screening. Mother had breast cancer at age 45 undergoing radiation for. Not genetic type per patient when went mom to oncology. No other health concerns. Sees Urgent care if needed. Denies any urinary symptoms today, spotting some with IUD today.  Patient's last menstrual period was 10/02/2013.          Sexually active: Yes.    The current method of family planning is tubal ligation.  Patient has Mirena IUD inserted 08/25/12 for menorrhagia.   Exercising: No.  exercise Smoker:  no  Health Maintenance: Pap:  2014 neg, HPV HR + no  MMG:  none Colonoscopy:  none BMD:   none TDaP:  Greater than 10 years Labs: Poct urine-wbc-1+, rbc 1+ Self breast exam:done monthly   reports that she has never smoked. She does not have any smokeless tobacco history on file. She reports that she does not drink alcohol or use illicit drugs.  Past Medical History  Diagnosis Date  . Arthritis     Past Surgical History  Procedure Laterality Date  . Tubal ligation      No current outpatient prescriptions on file.   No current facility-administered medications for this visit.    Family History  Problem Relation Age of Onset  . Cancer Paternal Grandfather     prostate  . Hypertension Paternal Grandfather   . Breast cancer Mother   . Thyroid disease Mother   . Diabetes Father     ROS:  Pertinent items are noted in HPI.  Otherwise, a comprehensive ROS was negative.  Exam:   BP 120/70  Pulse 74  Resp 16  Ht 5' 6.5" (1.689 m)  Wt 145 lb (65.772 kg)  BMI 23.06 kg/m2  LMP 10/02/2013 Height: 5' 6.5" (168.9 cm)  Ht Readings from Last 3 Encounters:  10/13/13 5' 6.5" (1.689 m)  02/09/13 5' 6.5" (1.689 m)   02/05/12 5' 7.8" (1.722 m)    General appearance: alert, cooperative and appears stated age Head: Normocephalic, without obvious abnormality, atraumatic Neck: no adenopathy, supple, symmetrical, trachea midline and thyroid normal to inspection and palpation Lungs: clear to auscultation bilaterally Breasts: normal appearance, no masses or tenderness, No nipple retraction or dimpling, No nipple discharge or bleeding, No axillary or supraclavicular adenopathy Heart: regular rate and rhythm Abdomen: soft, non-tender; no masses,  no organomegaly Extremities: extremities normal, atraumatic, no cyanosis or edema Skin: Skin color, texture, turgor normal. No rashes or lesions Lymph nodes: Cervical, supraclavicular, and axillary nodes normal. No abnormal inguinal nodes palpated Neurologic: Grossly normal   Pelvic: External genitalia:  no lesions              Urethra:  normal appearing urethra with no masses, tenderness or lesions              Bartholin's and Skene's: normal                 Vagina: normal appearing vagina with normal color and watery red brown discharge, no lesions ph 5.0, wet prep taken              Cervix: normal, non tender,  no lesions, IUD string noted  Pap taken: Yes.   Bimanual Exam:  Uterus:  normal size, contour, position, consistency, mobility, non-tender and anteverted              Adnexa: normal adnexa and no mass, fullness, tenderness               Rectovaginal: Confirms               Anus:  normal sphincter tone, no lesions  Wet Prep: clue cell, yeast  A:  Well Woman with normal exam  Contraception BTL  Mirena IUD for menorrhagia  History of Chronic BV  STD screening  History of abnormal pap + HPVHR 2014, follow up pap today  Family history of breast cancer( mother(63) MA(43)  Mammogram never been done  BV  Yeast vaginitis  P:   Reviewed health and wellness pertinent to exam  Aware of warning signs of IUD Removal due 7/24  Pap smear taken  today with HPVHR, follow up pap today  Lab:GC,Chlamydia, Vitamin D  Stressed importance of mammogram, given information to schedule and encouraged to go.   Reviewed findings of BV and yeast vaginitis. Rx Metrogel see order Rx Diflucan see order  Rv 2 weeks to see if resolved and consider Boric acid capsules as maintenance.   counseled on breast self exam, mammography screening, adequate intake of calcium and vitamin D, diet and exercise  return annually or prn  An After Visit Summary was printed and given to the patient.

## 2013-10-14 LAB — VITAMIN D 25 HYDROXY (VIT D DEFICIENCY, FRACTURES): Vit D, 25-Hydroxy: 20 ng/mL — ABNORMAL LOW (ref 30–89)

## 2013-10-15 LAB — IPS N GONORRHOEA AND CHLAMYDIA BY PCR

## 2013-10-15 LAB — IPS PAP TEST WITH HPV

## 2013-10-15 NOTE — Progress Notes (Signed)
Reviewed personally.  M. Suzanne Jamesrobert Ohanesian, MD.  

## 2013-10-26 ENCOUNTER — Encounter (HOSPITAL_COMMUNITY): Payer: Self-pay | Admitting: Emergency Medicine

## 2013-10-26 ENCOUNTER — Emergency Department (HOSPITAL_COMMUNITY)
Admission: EM | Admit: 2013-10-26 | Discharge: 2013-10-27 | Disposition: A | Discharge status: Home / Self Care | Payer: Managed Care, Other (non HMO) | Attending: Emergency Medicine | Admitting: Emergency Medicine

## 2013-10-26 DIAGNOSIS — Z8739 Personal history of other diseases of the musculoskeletal system and connective tissue: Secondary | ICD-10-CM | POA: Diagnosis not present

## 2013-10-26 DIAGNOSIS — L255 Unspecified contact dermatitis due to plants, except food: Secondary | ICD-10-CM | POA: Diagnosis not present

## 2013-10-26 DIAGNOSIS — L237 Allergic contact dermatitis due to plants, except food: Secondary | ICD-10-CM

## 2013-10-26 DIAGNOSIS — R21 Rash and other nonspecific skin eruption: Secondary | ICD-10-CM | POA: Diagnosis present

## 2013-10-26 MED ORDER — DIPHENHYDRAMINE HCL 25 MG PO TABS: 25.0000 mg | ORAL_TABLET | Freq: Four times a day (QID) | ORAL | Status: DC | PRN

## 2013-10-26 MED ORDER — PREDNISONE 20 MG PO TABS: 40.0000 mg | ORAL_TABLET | Freq: Every day | ORAL | Status: DC

## 2013-10-26 MED ORDER — HYDROXYZINE HCL 25 MG PO TABS
25.0000 mg | ORAL_TABLET | Freq: Once | ORAL | Status: AC
  Administered 2013-10-27: 25 mg via ORAL
  Filled 2013-10-26: qty 1

## 2013-10-26 NOTE — ED Provider Notes (Signed)
CSN: [id number]     Arrival date & time 10/26/13  2001 History   First MD Initiated Contact with Patient 10/26/13 2258     This chart was scribed for non-physician practitioner, Antonietta Breach, PA-C working with Dr. Jola Schmidt by Forrestine Him, ED Scribe. This patient was seen in room WTR7/WTR7 and the patient's care was started at 1:18 AM.   Chief Complaint  Patient presents with  . Insect Bite   The history is provided by the patient. No language interpreter was used.    HPI Comments: Brandy [name] is a 43 y.o. female who presents to the Emergency Department complaining of an insect bite to the L wrist initially noted 10 days ago. Pt also reports itching to the area onset 2 days. She states area started as a scratch and she progressively noted swelling and redness to the wrist. Ms. Rosita Fire admits to doing some gardening 10 days ago when she initially noted the area to her wrist. She has tried topical OTC Hydrocortisone cream without any improvement for symptoms. Pt denies any fever or chills. No numbness or weakness. No noted drainage. No recent antibiotic use. She denies using any new lotions, soaps, or detergents. No known allergies to medications.   Past Medical History  Diagnosis Date  . Arthritis   . Disc degeneration, lumbar 2011   Past Surgical History  Procedure Laterality Date  . Tubal ligation     Family History  Problem Relation Age of Onset  . Cancer Paternal Grandfather     prostate  . Hypertension Paternal Grandfather   . Breast cancer Mother   . Thyroid disease Mother   . Diabetes Father   . Breast cancer Maternal Aunt 13    died from cancer   History  Substance Use Topics  . Smoking status: Never Smoker   . Smokeless tobacco: Never Used  . Alcohol Use: No   OB History   Grav Para Term Preterm Abortions TAB SAB Ect Mult Living   2 2 2      1 3       Review of Systems  Constitutional: Negative for fever and chills.  Skin:       Area of swelling and  erythema to L wrist  Neurological: Negative for weakness and numbness.  All other systems reviewed and are negative.   Allergies  Review of patient's allergies indicates no known allergies.  Home Medications   Prior to Admission medications   Medication Sig Start Date End Date Taking? Authorizing Provider  diphenhydrAMINE (BENADRYL) 25 MG tablet Take 1 tablet (25 mg total) by mouth every 6 (six) hours as needed for itching (Rash). 10/26/13   Antonietta Breach, PA-C  fluconazole (DIFLUCAN) 150 MG tablet Take one tablet and repeat one in 5 days 10/13/13   Regina Eck, CNM  metroNIDAZOLE (METROGEL) 0.75 % vaginal gel Place 1 Applicatorful vaginally 2 (two) times daily. 10/13/13   Regina Eck, CNM  predniSONE (DELTASONE) 20 MG tablet Take 2 tablets (40 mg total) by mouth daily. Take 40 mg by mouth daily for 3 days, then 20mg  by mouth daily for 3 days, then 10mg  daily for 3 days 10/26/13   Antonietta Breach, PA-C   Triage Vitals: BP 155/99  Pulse 64  Temp(Src) 98.3 F (36.8 C) (Oral)  Resp 15  Ht 5\' 7"  (1.702 m)  Wt 143 lb (64.864 kg)  BMI 22.39 kg/m2  SpO2 98%  LMP 10/02/2013   Physical Exam  Nursing note and vitals  reviewed. Constitutional: She is oriented to person, place, and time. She appears well-developed and well-nourished. No distress.  Nontoxic/nonseptic appearing  HENT:  Head: Normocephalic and atraumatic.  Eyes: Conjunctivae and EOM are normal. No scleral icterus.  Neck: Normal range of motion.  Cardiovascular: Normal rate, regular rhythm and intact distal pulses.   Distal radial pulse 2+ in LUE  Pulmonary/Chest: Effort normal. No respiratory distress.  Abdominal: She exhibits no distension.  Musculoskeletal: Normal range of motion.  Neurological: She is alert and oriented to person, place, and time. She exhibits normal muscle tone. Coordination normal.  No gross sensory deficits. Patient moves extremities without ataxia.  Skin: Skin is warm and dry. She is not  diaphoretic. No erythema. No pallor.  Mildly erythematous, pruritic, maculopapular and punctate vesicular rash to distal L forearm, 3cm in diameter. No heat to touch or active drainage. Punctate, pruritic, mild erythematous vesicle on volar distal forearm also appreciated.  Psychiatric: She has a normal mood and affect. Her behavior is normal.    ED Course  Procedures (including critical care time)  DIAGNOSTIC STUDIES: Oxygen Saturation is 98% on RA, Normal by my interpretation.    COORDINATION OF CARE: 1:18 AM-Discussed treatment plan with pt at bedside and pt agreed to plan.     Labs Review Labs Reviewed - No data to display  Imaging Review No results found.   EKG Interpretation None      MDM   Final diagnoses:  Contact dermatitis due to poison ivy    Pt presentation consistant with poison ivy infection. Discussed contagiousness & home care. Treated in ED w atarax. Patient to be discharged with recommendation to get OTC Zanfel. Script for PO prednisone taper given. Strict return precautions discussed. Pt afebrile and in NAD prior to dc. Airway intact without compromise. No facial or genital involvement of rash. Patient agreeable to plan with no unaddressed concerns.  I personally performed the services described in this documentation, which was scribed in my presence. The recorded information has been reviewed and is accurate.    Filed Vitals:   10/26/13 2038  BP: 155/99  Pulse: 64  Temp: 98.3 F (36.8 C)  TempSrc: Oral  Resp: 15  Height: 5\' 7"  (1.702 m)  Weight: 143 lb (64.864 kg)  SpO2: 98%     Antonietta Breach, PA-C 10/27/13 0125

## 2013-10-26 NOTE — ED Notes (Signed)
Pt presents with ?insect bite to L posterior wrist x 2 days.  Pt reports itching and pain with movement.  Redness noted.

## 2013-10-26 NOTE — Discharge Instructions (Signed)
Recommend topical calamine lotion or hydrocortisone. Take prednisone as prescribed and Benadryl as needed for itching. You may try OTC Zanfel if desired. Follow up with your primary doctor to ensure symptoms resolve.  Poison [employer] ivy is a inflammation of the skin (contact dermatitis) caused by touching the allergens on the leaves of the ivy plant following previous exposure to the plant. The rash usually appears 48 hours after exposure. The rash is usually bumps (papules) or blisters (vesicles) in a linear pattern. Depending on your own sensitivity, the rash may simply cause redness and itching, or it may also progress to blisters which may break open. These must be well cared for to prevent secondary bacterial (germ) infection, followed by scarring. Keep any open areas dry, clean, dressed, and covered with an antibacterial ointment if needed. The eyes may also get puffy. The puffiness is worst in the morning and gets better as the day progresses. This dermatitis usually heals without scarring, within 2 to 3 weeks without treatment. HOME CARE INSTRUCTIONS  Thoroughly wash with soap and water as soon as you have been exposed to poison ivy. You have about one half hour to remove the plant resin before it will cause the rash. This washing will destroy the oil or antigen on the skin that is causing, or will cause, the rash. Be sure to wash under your fingernails as any plant resin there will continue to spread the rash. Do not rub skin vigorously when washing affected area. Poison ivy cannot spread if no oil from the plant remains on your body. A rash that has progressed to weeping sores will not spread the rash unless you have not washed thoroughly. It is also important to wash any clothes you have been wearing as these may carry active allergens. The rash will return if you wear the unwashed clothing, even several days later. Avoidance of the plant in the future is the best measure. Poison ivy plant can be  recognized by the number of leaves. Generally, poison ivy has three leaves with flowering branches on a single stem. Diphenhydramine may be purchased over the counter and used as needed for itching. Do not drive with this medication if it makes you drowsy.Ask your caregiver about medication for children. SEEK MEDICAL CARE IF:  Open sores develop.  Redness spreads beyond area of rash.  You notice purulent (pus-like) discharge.  You have increased pain.  Other signs of infection develop (such as fever). Document Released: 02/02/2000 Document Revised: 04/29/2011 Document Reviewed: 07/15/2008 [facility] Patient Information 2015 [place], [place]. This information is not intended to replace advice given to you by your health care provider. Make sure you discuss any questions you have with your health care provider.

## 2013-10-27 NOTE — ED Provider Notes (Signed)
Medical screening examination/treatment/procedure(s) were performed by non-physician practitioner and as supervising physician I was immediately available for consultation/collaboration.   Hoy Morn, MD 10/27/13 [phone]

## 2013-10-28 ENCOUNTER — Encounter: Payer: Self-pay | Admitting: Certified Nurse Midwife

## 2013-10-28 ENCOUNTER — Ambulatory Visit (INDEPENDENT_AMBULATORY_CARE_PROVIDER_SITE_OTHER): Payer: Managed Care, Other (non HMO) | Admitting: Certified Nurse Midwife

## 2013-10-28 ENCOUNTER — Ambulatory Visit: Payer: Managed Care, Other (non HMO) | Admitting: Certified Nurse Midwife

## 2013-10-28 VITALS — BP 104/70 | HR 70 | Resp 16 | Ht 66.5 in | Wt 147.0 lb

## 2013-10-28 DIAGNOSIS — N760 Acute vaginitis: Secondary | ICD-10-CM

## 2013-10-28 NOTE — Patient Instructions (Signed)
Bacterial Vaginosis Bacterial vaginosis is a vaginal infection that occurs when the normal balance of bacteria in the vagina is disrupted. It results from an overgrowth of certain bacteria. This is the most common vaginal infection in women of childbearing age. Treatment is important to prevent complications, especially in pregnant women, as it can cause a premature delivery. CAUSES  Bacterial vaginosis is caused by an increase in harmful bacteria that are normally present in smaller amounts in the vagina. Several different kinds of bacteria can cause bacterial vaginosis. However, the reason that the condition develops is not fully understood. RISK FACTORS Certain activities or behaviors can put you at an increased risk of developing bacterial vaginosis, including:  Having a new sex partner or multiple sex partners.  Douching.  Using an intrauterine device (IUD) for contraception. Women do not get bacterial vaginosis from toilet seats, bedding, swimming pools, or contact with objects around them. SIGNS AND SYMPTOMS  Some women with bacterial vaginosis have no signs or symptoms. Common symptoms include:  Grey vaginal discharge.  A fishlike odor with discharge, especially after sexual intercourse.  Itching or burning of the vagina and vulva.  Burning or pain with urination. DIAGNOSIS  Your health care provider will take a medical history and examine the vagina for signs of bacterial vaginosis. A sample of vaginal fluid may be taken. Your health care provider will look at this sample under a microscope to check for bacteria and abnormal cells. A vaginal pH test may also be done.  TREATMENT  Bacterial vaginosis may be treated with antibiotic medicines. These may be given in the form of a pill or a vaginal cream. A second round of antibiotics may be prescribed if the condition comes back after treatment.  HOME CARE INSTRUCTIONS   Only take over-the-counter or prescription medicines as  directed by your health care provider.  If antibiotic medicine was prescribed, take it as directed. Make sure you finish it even if you start to feel better.  Do not have sex until treatment is completed.  Tell all sexual partners that you have a vaginal infection. They should see their health care provider and be treated if they have problems, such as a mild rash or itching.  Practice safe sex by using condoms and only having one sex partner. SEEK MEDICAL CARE IF:   Your symptoms are not improving after 3 days of treatment.  You have increased discharge or pain.  You have a fever. MAKE SURE YOU:   Understand these instructions.  Will watch your condition.  Will get help right away if you are not doing well or get worse. FOR MORE INFORMATION  Centers for Disease Control and Prevention, Division of STD Prevention: www.cdc.gov/std American Sexual Health Association (ASHA): www.ashastd.org  Document Released: 02/04/2005 Document Revised: 11/25/2012 Document Reviewed: 09/16/2012 [employer] Patient Information 2015 [employer]. This information is not intended to replace advice given to you by your health care provider. Make sure you discuss any questions you have with your health care provider.  

## 2013-10-28 NOTE — Progress Notes (Signed)
43 y.o. Divorced [place] American female [phone] here for follow up of BV/yeast vaginitis treated with Metrogel and Diflucan initiated on 10/13/13.Completed all medication as directed.  Denies any symptoms of itching burning or increase discharge.. Patient denies any odor also. History of chronic BV. Contraception Mirena IUD, working well.  O: Healthy WD,WN female Affect:normal  Pelvic exam:EXTERNAL GENITALIA: normal appearing vulva with no masses, tenderness or lesions VAGINA: no abnormal discharge or lesions, Wet Prep/KOH no pathogens and pH 4.0 . CERVIX: no lesions or cervical motion tenderness and IUD string noted UTERUS: anteverted and normal, non tender ADNEXA: no masses palpable and nontender  A:BV/yeast vaginitis Resolved  History of chronic BV   P: Discussed findings of negative wet prep. Discussed Boric acid capsules use for 3 days in two weeks to maintain normal vaginal ph to prevent reoccurrence. Patient agreeable. Encouraged to use once monthly if odor noted. Patient to advise if other symptoms occur.  Rx Boric Acid in 0 gelatin capsule with 200 mg Boric acid, insert vaginally every hs x 3 once monthly Disp 30 one refill  RV prn

## 2013-11-08 NOTE — Progress Notes (Signed)
Reviewed personally.  M. Suzanne Aritzel Krusemark, MD.  

## 2013-12-20 ENCOUNTER — Encounter: Payer: Self-pay | Admitting: Certified Nurse Midwife

## 2014-05-06 ENCOUNTER — Encounter: Payer: Self-pay | Admitting: Nurse Practitioner

## 2014-07-06 ENCOUNTER — Encounter: Payer: Self-pay | Admitting: Certified Nurse Midwife

## 2014-10-18 ENCOUNTER — Ambulatory Visit: Payer: Managed Care, Other (non HMO) | Admitting: Certified Nurse Midwife

## 2016-05-14 ENCOUNTER — Encounter (HOSPITAL_COMMUNITY): Payer: Self-pay

## 2016-05-14 ENCOUNTER — Emergency Department (HOSPITAL_COMMUNITY): Payer: 59

## 2016-05-14 ENCOUNTER — Emergency Department (HOSPITAL_COMMUNITY)
Admission: EM | Admit: 2016-05-14 | Discharge: 2016-05-14 | Disposition: A | Discharge status: Home / Self Care | Payer: 59 | Attending: Emergency Medicine | Admitting: Emergency Medicine

## 2016-05-14 DIAGNOSIS — R072 Precordial pain: Secondary | ICD-10-CM | POA: Insufficient documentation

## 2016-05-14 LAB — CBC
HCT: 45.5 % (ref 36.0–46.0)
Hemoglobin: 15.5 g/dL — ABNORMAL HIGH (ref 12.0–15.0)
MCH: 30.6 pg (ref 26.0–34.0)
MCHC: 34.1 g/dL (ref 30.0–36.0)
MCV: 89.9 fL (ref 78.0–100.0)
Platelets: 278 10*3/uL (ref 150–400)
RBC: 5.06 MIL/uL (ref 3.87–5.11)
RDW: 13.5 % (ref 11.5–15.5)
WBC: 6.2 10*3/uL (ref 4.0–10.5)

## 2016-05-14 LAB — BASIC METABOLIC PANEL
Anion gap: 13 (ref 5–15)
BUN: 11 mg/dL (ref 6–20)
CO2: 23 mmol/L (ref 22–32)
Calcium: 9.3 mg/dL (ref 8.9–10.3)
Chloride: 103 mmol/L (ref 101–111)
Creatinine, Ser: 0.93 mg/dL (ref 0.44–1.00)
GFR calc Af Amer: >60 mL/min (ref >60)
GFR calc non Af Amer: >60 mL/min (ref >60)
Glucose, Bld: 105 mg/dL — ABNORMAL HIGH (ref 65–99)
Potassium: 4.1 mmol/L (ref 3.5–5.1)
Sodium: 139 mmol/L (ref 135–145)

## 2016-05-14 LAB — I-STAT TROPONIN, ED
Troponin i, poc: 0 ng/mL (ref 0.00–0.08)
Troponin i, poc: 0 ng/mL (ref 0.00–0.08)

## 2016-05-14 LAB — D-DIMER, QUANTITATIVE (NOT AT ARMC): D-Dimer, Quant: 0.31 ug/mL-FEU (ref 0.00–0.50)

## 2016-05-14 MED ORDER — HYDROCODONE-ACETAMINOPHEN 5-325 MG PO TABS
1.0000 | ORAL_TABLET | Freq: Once | ORAL | Status: AC
  Administered 2016-05-14: 1 via ORAL
  Filled 2016-05-14: qty 1

## 2016-05-14 MED ORDER — OXYCODONE-ACETAMINOPHEN 5-325 MG PO TABS: 1.0000 | ORAL_TABLET | ORAL | 0 refills | Status: DC | PRN

## 2016-05-14 MED ORDER — CYCLOBENZAPRINE HCL 10 MG PO TABS
10.0000 mg | ORAL_TABLET | Freq: Once | ORAL | Status: AC
  Administered 2016-05-14: 10 mg via ORAL
  Filled 2016-05-14: qty 1

## 2016-05-14 MED ORDER — GI COCKTAIL ~~LOC~~
30.0000 mL | Freq: Once | ORAL | Status: AC
  Administered 2016-05-14: 30 mL via ORAL
  Filled 2016-05-14: qty 30

## 2016-05-14 MED ORDER — CYCLOBENZAPRINE HCL 10 MG PO TABS: 10.0000 mg | ORAL_TABLET | Freq: Two times a day (BID) | ORAL | 0 refills | Status: DC | PRN

## 2016-05-14 NOTE — ED Provider Notes (Signed)
[facility] DEPT Provider Note   CSN: [id number] Arrival date & time: 05/14/16  1402  By signing my name below, I, Brandy [name], attest that this documentation has been prepared under the direction and in the presence of Courtney Paris, MD. Electronically Signed: Ethelle Lyon [name], Scribe. 05/14/2016. 6:25 PM.  History   Chief Complaint Chief Complaint  Patient presents with  . Chest Pain   The history is provided by the patient and medical records. No language interpreter was used.  Chest Pain   This is a new problem. The current episode started more than 2 days ago. The pain is at a severity of 4/10. The pain is mild. The quality of the pain is described as stabbing. The pain does not radiate. Duration of episode(s) is 30 minutes. Associated symptoms include headaches. Pertinent negatives include no abdominal pain, no back pain, no cough, no diaphoresis, no dizziness, no fever, no nausea, no numbness, no palpitations, no shortness of breath, no vomiting and no weakness. She has tried nothing for the symptoms. The treatment provided no relief. There are no known risk factors.  Pertinent negatives for past medical history include no CAD, no CHF, no diabetes, no DVT, no PE and no strokes.  Pertinent negatives for family medical history include: no CAD, no heart disease and no PE.   HPI Comments:  Brandy [name] is a 46 y.o. female with a PMHx of Arthritis and DDD, who presents to the Emergency Department complaining of intermittent, centralized, non-radiating, stabbing, 4/10, CP onset a week and a half ago. She reports this stabbing pain is a new pain to her but has been intermittent over the past week and a half. No recent trauma or injures. She states this sudden onset pain occurs twice a day, lasts for ~30-40 minutes, then slowly improves. Pt reports 10/10 pain during these episodes. No FHx of significant coronary events. No h/o DVT/PE. Pt has associated symptoms of SOB, mild  congestion, and a HA. Pt also reports feeling quite bloated pot-prandial with GERD running in the family. She states bending over exacerbates her pain but denies exertion, palpation, eating, or deep inspiration exacerbating the pain. She tried some OTC medications for the congestion and HA at home with minimal relief. Pt denies fever, chills, dysuria, hematuria, constipation, diarrhea, abdominal pain, cough, rhinorrhea, and any other complaints at this time.  Past Medical History:  Diagnosis Date  . Arthritis   . Disc degeneration, lumbar 2011   There are no active problems to display for this patient.  Past Surgical History:  Procedure Laterality Date  . TUBAL LIGATION     OB History    Gravida Para Term Preterm AB Living   2 2 2     3    SAB TAB Ectopic Multiple Live Births         1 3     Home Medications    Prior to Admission medications   Medication Sig Start Date End Date Taking? Authorizing Provider  diphenhydrAMINE (BENADRYL) 25 MG tablet Take 1 tablet (25 mg total) by mouth every 6 (six) hours as needed for itching (Rash). 10/26/13   Antonietta Breach, PA-C  predniSONE (DELTASONE) 20 MG tablet Take 2 tablets (40 mg total) by mouth daily. Take 40 mg by mouth daily for 3 days, then 20mg  by mouth daily for 3 days, then 10mg  daily for 3 days 10/26/13   Antonietta Breach, PA-C   Family History Family History  Problem Relation Age of Onset  .  Cancer Paternal Grandfather     prostate  . Hypertension Paternal Grandfather   . Breast cancer Mother   . Thyroid disease Mother   . Diabetes Father   . Breast cancer Maternal Aunt 63    died from cancer    Social History Social History  Substance Use Topics  . Smoking status: Never Smoker  . Smokeless tobacco: Never Used  . Alcohol use No    Allergies   Patient has no known allergies.   Review of Systems Review of Systems  Constitutional: Negative for activity change, chills, diaphoresis, fatigue and fever.  HENT: Positive for  congestion (mild). Negative for rhinorrhea.   Eyes: Negative for visual disturbance.  Respiratory: Negative for cough, chest tightness, shortness of breath and stridor.   Cardiovascular: Positive for chest pain. Negative for palpitations and leg swelling.  Gastrointestinal: Negative for abdominal distention, abdominal pain, constipation, diarrhea, nausea and vomiting.  Genitourinary: Negative for difficulty urinating, dysuria, flank pain, frequency, hematuria, menstrual problem, pelvic pain, vaginal bleeding and vaginal discharge.  Musculoskeletal: Negative for back pain and neck pain.  Skin: Negative for rash and wound.  Neurological: Positive for headaches. Negative for dizziness, weakness, light-headedness and numbness.  Psychiatric/Behavioral: Negative for agitation and confusion.  All other systems reviewed and are negative.   Physical Exam Updated Vital Signs BP (!) 143/107   Pulse 91   Temp 97.8 F (36.6 C) (Oral)   Resp 11   SpO2 99%   Physical Exam  Constitutional: She is oriented to person, place, and time. She appears well-developed and well-nourished. No distress.  HENT:  Head: Normocephalic and atraumatic.  Right Ear: External ear normal.  Left Ear: External ear normal.  Nose: Nose normal.  Mouth/Throat: Oropharynx is clear and moist. No oropharyngeal exudate.  Eyes: Conjunctivae and EOM are normal. Pupils are equal, round, and reactive to light.  Neck: Normal range of motion. Neck supple.  Cardiovascular: Regular rhythm.  Tachycardia present.  Exam reveals no gallop and no friction rub.   No murmur heard. Pulmonary/Chest: Effort normal and breath sounds normal. No stridor. No respiratory distress. She has no wheezes. She has no rales. She exhibits no tenderness.  Abdominal: She exhibits no distension. There is no tenderness. There is no rebound.  Neurological: She is alert and oriented to person, place, and time. She has normal reflexes. She exhibits normal muscle  tone. Coordination normal.  Skin: Skin is warm. No rash noted. She is not diaphoretic. No erythema.  Nursing note and vitals reviewed.   ED Treatments / Results  DIAGNOSTIC STUDIES:  Oxygen Saturation is 99% on RA, normal by my interpretation.    COORDINATION OF CARE:  6:22 PM Discussed treatment plan with pt at bedside including blood work, CXR, D-Dimer, GI Cocktail, and EKG and pt agreed to plan.  Labs (all labs ordered are listed, but only abnormal results are displayed) Labs Reviewed  BASIC METABOLIC PANEL - Abnormal; Notable for the following:       Result Value   Glucose, Bld 105 (*)    All other components within normal limits  CBC - Abnormal; Notable for the following:    Hemoglobin 15.5 (*)    All other components within normal limits  D-DIMER, QUANTITATIVE (NOT AT [facility])  Randolm Idol, ED  I-STAT TROPOININ, ED    EKG  EKG Interpretation  Date/Time:  Tuesday May 14 2016 14:11:22 EDT Ventricular Rate:  92 PR Interval:  174 QRS Duration: 74 QT Interval:  350 QTC Calculation: 432 R  Axis:   20 Text Interpretation:  Normal sinus rhythm Normal ECG No prior ECG for comparison.  NO STEMI Confirmed by Sherry Ruffing MD, [place] [phone]) on 05/14/2016 6:03:19 PM       Radiology Dg Chest 2 View  Result Date: 05/14/2016 CLINICAL DATA:  Chest pain for 2 weeks EXAM: CHEST  2 VIEW COMPARISON:  None. FINDINGS: There is no focal parenchymal opacity. There is no pleural effusion or pneumothorax. The heart and mediastinal contours are unremarkable. There is an S-shaped curvature of the thoracolumbar spine. IMPRESSION: No active cardiopulmonary disease. Electronically Signed   By: Kathreen Devoid   On: 05/14/2016 15:50    Procedures Procedures (including critical care time)  Medications Ordered in ED Medications  gi cocktail (Maalox,Lidocaine,Donnatal) (30 mLs Oral Given 05/14/16 1845)  HYDROcodone-acetaminophen (NORCO/VICODIN) 5-325 MG per tablet 1 tablet (1 tablet Oral  Given 05/14/16 1845)  cyclobenzaprine (FLEXERIL) tablet 10 mg (10 mg Oral Given 05/14/16 1845)     Initial Impression / Assessment and Plan / ED Course  I have reviewed the triage vital signs and the nursing notes.  Pertinent labs & imaging results that were available during my care of the patient were reviewed by me and considered in my medical decision making (see chart for details).     Brandy [name] is a 46 y.o. female with a PMHx of Arthritis and DDD, who presents to the Emergency Department complaining of intermittent, centralized, non-radiating, stabbing, 4/10, CP onset a week and a half ago  History and exam are seen above.  On exam, patient had no chest tenderness but had worsened pain with sitting up and twisting her torso. Lungs were clear. No CVA tenderness. Abdomen nontender. No focal neurologic deficits.  Given patient's symptoms, suspect a musculoskeletal type chest pain versus a reflux type pain. Patient however did have lab testing imaging and EKG to evaluate for cardiac or pulmonary type discomfort. EKG no signs of acute ischemia. Chest x-ray unremarkable. Laboratory testing results are seen above.  Lab testing showed negative troponin 2, negative d-dimer, and unremarkable CBC and BMP.  Patient given GI cocktail, Flexeril, and Norco. Patient had some mild improvement with her symptoms.  Family reports that patient has had increase in stress.  Given reassuring workup, do not feel patient needs further workup or admission. Suspect a musculoskeletal type chest pain at this time. Patient will be given prescription for pain medicine and muscle relaxant to help her symptoms. Patient given instructions to schedule a follow-up with a PCP for ongoing management. Patient also given instructions for return if any symptoms change or worsen. Patient instructed to follow-up with her PCP for management of her hypertension. Given no prior vitals, and the mildly elevated blood pressure,  do not feel patient needs to initiate blood pressure medications at this time. Patient will discuss this with a PCP.  Patient had no other questions or concerns and patient was discharged in good condition.    Final Clinical Impressions(s) / ED Diagnoses   Final diagnoses:  Precordial chest pain    New Prescriptions New Prescriptions   CYCLOBENZAPRINE (FLEXERIL) 10 MG TABLET    Take 1 tablet (10 mg total) by mouth 2 (two) times daily as needed for muscle spasms.   OXYCODONE-ACETAMINOPHEN (PERCOCET/ROXICET) 5-325 MG TABLET    Take 1 tablet by mouth every 4 (four) hours as needed for severe pain.   I personally performed the services described in this documentation, which was scribed in my presence. The recorded information has been reviewed  and is accurate.  Clinical Impression: 1. Precordial chest pain     Disposition: Discharge  Condition: Good  I have discussed the results, Dx and Tx plan with the pt(& family if present). He/she/they expressed understanding and agree(s) with the plan. Discharge instructions discussed at great length. Strict return precautions discussed and pt &/or family have verbalized understanding of the instructions. No further questions at time of discharge.    New Prescriptions   CYCLOBENZAPRINE (FLEXERIL) 10 MG TABLET    Take 1 tablet (10 mg total) by mouth 2 (two) times daily as needed for muscle spasms.   OXYCODONE-ACETAMINOPHEN (PERCOCET/ROXICET) 5-325 MG TABLET    Take 1 tablet by mouth every 4 (four) hours as needed for severe pain.    Follow Up: [place] [id number] [phone] Schedule an appointment as soon as possible for a visit in 3 days   [place] [facility] [place] [phone]  If symptoms worsen     Courtney Paris, MD 05/14/16 2149

## 2016-05-14 NOTE — ED Notes (Signed)
Patient verbalized understanding of discharge instructions and denies any further needs or questions at this time. VS stable. Patient ambulatory with steady gait.  

## 2016-05-14 NOTE — Discharge Instructions (Signed)
Your workup today did not show evidence of a heart or lung cause of your symptoms. We do not think you have a blood clot or a cardiac cause of your chest pain. As it is worsened with movement and certain positions, we suspect a musculoskeletal type chest pain. Please take the pain medicine and muscle relaxant to help with your symptoms. Please schedule a follow-up appointment with a primary care physician for further management of your symptoms. If any symptoms acutely worsen or change, please return to the nearest emergency department.

## 2016-05-14 NOTE — ED Notes (Signed)
Updated patient on delay - verbalized understanding.

## 2016-05-14 NOTE — ED Triage Notes (Signed)
Pt states central non radiating chest pain X1 week. She reports pain is worse when she bends over. No SOB presently. Skin warm and dry. No distress noted, no cardiac hx.

## 2016-06-04 ENCOUNTER — Other Ambulatory Visit: Payer: Self-pay

## 2016-06-04 ENCOUNTER — Ambulatory Visit: Payer: 59 | Attending: Internal Medicine | Admitting: Physician Assistant

## 2016-06-04 VITALS — BP 174/124 | HR 79 | Temp 99.1°F | Resp 16 | Ht 67.0 in | Wt 170.0 lb

## 2016-06-04 DIAGNOSIS — I10 Essential (primary) hypertension: Secondary | ICD-10-CM | POA: Diagnosis not present

## 2016-06-04 DIAGNOSIS — R079 Chest pain, unspecified: Secondary | ICD-10-CM

## 2016-06-04 DIAGNOSIS — R5383 Other fatigue: Secondary | ICD-10-CM | POA: Diagnosis not present

## 2016-06-04 DIAGNOSIS — R071 Chest pain on breathing: Secondary | ICD-10-CM

## 2016-06-04 DIAGNOSIS — R0789 Other chest pain: Secondary | ICD-10-CM

## 2016-06-04 MED ORDER — MELOXICAM 15 MG PO TABS: 15.0000 mg | ORAL_TABLET | Freq: Every day | ORAL | 1 refills | Status: DC

## 2016-06-04 MED ORDER — METHOCARBAMOL 500 MG PO TABS: 500.0000 mg | ORAL_TABLET | Freq: Three times a day (TID) | ORAL | 0 refills | Status: DC

## 2016-06-04 MED ORDER — HYDROCHLOROTHIAZIDE 25 MG PO TABS: 25.0000 mg | ORAL_TABLET | Freq: Every day | ORAL | 0 refills | Status: DC

## 2016-06-04 NOTE — Progress Notes (Signed)
Patient ID: Brandy [name], female   DOB: September 06, 1970, 46 y.o.   MRN: [id number]     Brandy [name], is a 46 y.o. female  KKX:[id number]  [id number]:[id number]  DOB - [birth year]  Subjective:  Chief Complaint and HPI: Brandy [name] is a 46 y.o. female here today to establish care and for a follow up visitAfter being seen in the ED 05/14/2016 for Precordial CP.  Cardiac workup was neg.  EKG was normal, D-dimer and cardiac enzymes negative.  BMP/CBC essentially normal.  Today she presents with some improvement in CP but still concerned.  Pain is sharp and in mid L chest, just L of the sternum.  The pain is reproducible with pushing on the area.  Sometimes seems to be worsened with certain muscular movements/reaching/etc.  Comes and goes and can last up to hours.  No radiating pain.  No DOE.  Not worse with activity.  NKI.  No SOB.  Pain worse with bending over or leaning forward.  No recent tooth cleaning or other procedure.  No h/o heart murmur.  No recent illness.  No f/c.  She has been checking BP OOO and consistently getting numbers ~140/100 for the last several weeks.  Nothing specific elicits or precipitates the pain or alleviates it.  She also c/o several month h/o fatigue, weight gain. Marland Kitchen   ED/Hospital notes reviewed.   Family history: strong FH htn; no h/o early cardiac/cerebral event  ROS:   Constitutional:  No f/c, No night sweats, No unexplained weight loss. + fatigue EENT:  No vision changes, No blurry vision, No hearing changes. No mouth, throat, or ear problems.  Respiratory: No cough, No SOB Cardiac: +CP, no palpitations GI:  No abd pain, No N/V/D. GU: No Urinary s/sx Musculoskeletal: No joint pain Neuro: No headache, no dizziness, no motor weakness.  Skin: No rash Endocrine:  No polydipsia. No polyuria.  Psych: Denies SI/HI  No problems updated.  ALLERGIES: No Known Allergies  PAST MEDICAL HISTORY: Past Medical History:  Diagnosis Date  . Arthritis   . Disc  degeneration, lumbar 2011    MEDICATIONS AT HOME: Prior to Admission medications   Medication Sig Start Date End Date Taking? Authorizing Provider  hydrochlorothiazide (HYDRODIURIL) 25 MG tablet Take 1 tablet (25 mg total) by mouth daily. 06/04/16   Argentina Donovan, PA-C  meloxicam (MOBIC) 15 MG tablet Take 1 tablet (15 mg total) by mouth daily. X 10 days then prn pain 06/04/16   Argentina Donovan, PA-C  methocarbamol (ROBAXIN) 500 MG tablet Take 1 tablet (500 mg total) by mouth 3 (three) times daily. X 10 days then prn muscle spasm 06/04/16   Argentina Donovan, PA-C     Objective:  EXAM:   Vitals:   06/04/16 1438 06/04/16 1443  BP: (!) 164/125 (!) 174/124  Pulse: 79 79  Resp: 16   Temp: 99.1 F (37.3 C)   TempSrc: Oral   SpO2: 97%   Weight: 170 lb (77.1 kg)   Height: 5\' 7"  (1.702 m)     General appearance : A&OX3. NAD. Non-toxic-appearing HEENT: Atraumatic and Normocephalic.  PERRLA. EOM intact.  TM clear B. Mouth-MMM, post pharynx WNL w/o erythema, No PND. Neck: supple, no JVD. No cervical lymphadenopathy. No thyromegaly Chest/Lungs:  Breathing-non-labored, Good air entry bilaterally, breath sounds normal without rales, rhonchi, or wheezing  CVS: S1 S2 regular, no murmurs, gallops, rubs  Abdomen: Bowel sounds present, Non tender and not distended with no gaurding, rigidity or rebound. Extremities: Bilateral Lower Ext  shows no edema, both legs are warm to touch with = pulse throughout Neurology:  CN II-XII grossly intact, Non focal.   Psych:  TP linear. J/I WNL. Normal speech. Appropriate eye contact and affect.  Skin:  No Rash  Data Review No results found for: HGBA1C EKG w/o acute changes and no pericarditis  Assessment & Plan   1. Fatigue, unspecified type - TSH - Vitamin D, 25-hydroxy  2. Chest pain, unspecified type-see #4 - meloxicam (MOBIC) 15 MG tablet; Take 1 tablet (15 mg total) by mouth daily. X 10 days then prn pain  Dispense: 30 tablet; Refill: 1 -  methocarbamol (ROBAXIN) 500 MG tablet; Take 1 tablet (500 mg total) by mouth 3 (three) times daily. X 10 days then prn muscle spasm  Dispense: 90 tablet; Refill: 0  3. Hypertension, unspecified type New diagnosis.  start- hydrochlorothiazide (HYDRODIURIL) 25 MG tablet; Take 1 tablet (25 mg total) by mouth daily.  Dispense: 90 tablet; Refill: 0 We have discussed target BP range and blood pressure goal. I have advised patient to check BP regularly and to call us back or report to clinic if the numbers are consistently higher than 140/90. We discussed the importance of compliance with medical therapy and DASH diet recommended, consequences of uncontrolled hypertension discussed. Also recommended eliminating sugar and carbs for weight loss and healthier lifestyle Check Blood pressure daily and record and bring to next visit  4. Costochondral chest pain Mobic and methocarbamol.  Information given on condition  Patient have been counseled extensively about nutrition and exercise  Return in about 3 weeks (around 06/25/2016) for assign PCP; f/up htn, CP, and fatigue.  The patient was given clear instructions to go to ER or return to medical center if symptoms don't improve, worsen or new problems develop. The patient verbalized understanding. The patient was told to call to get lab results if they haven't heard anything in the next week.     Freeman Caldron, PA-C [facility] [place], [place]   06/04/2016, 3:07 PM

## 2016-06-04 NOTE — Patient Instructions (Addendum)
Check Blood pressure daily and record and bring to next visit  Hypertension Hypertension, commonly called high blood pressure, is when the force of blood pumping through the arteries is too strong. The arteries are the blood vessels that carry blood from the heart throughout the body. Hypertension forces the heart to work harder to pump blood and may cause arteries to become narrow or stiff. Having untreated or uncontrolled hypertension can cause heart attacks, strokes, kidney disease, and other problems. A blood pressure reading consists of a higher number over a lower number. Ideally, your blood pressure should be below 120/80. The first ("top") number is called the systolic pressure. It is a measure of the pressure in your arteries as your heart beats. The second ("bottom") number is called the diastolic pressure. It is a measure of the pressure in your arteries as the heart relaxes. What are the causes? The cause of this condition is not known. What increases the risk? Some risk factors for high blood pressure are under your control. Others are not. Factors you can change   Smoking.  Having type 2 diabetes mellitus, high cholesterol, or both.  Not getting enough exercise or physical activity.  Being overweight.  Having too much fat, sugar, calories, or salt (sodium) in your diet.  Drinking too much alcohol. Factors that are difficult or impossible to change   Having chronic kidney disease.  Having a family history of high blood pressure.  Age. Risk increases with age.  Race. You may be at higher risk if you are African-American.  Gender. Men are at higher risk than women before age 62. After age 29, women are at higher risk than men.  Having obstructive sleep apnea.  Stress. What are the signs or symptoms? Extremely high blood pressure (hypertensive crisis) may cause:  Headache.  Anxiety.  Shortness of breath.  Nosebleed.  Nausea and vomiting.  Severe chest  pain.  Jerky movements you cannot control (seizures). How is this diagnosed? This condition is diagnosed by measuring your blood pressure while you are seated, with your arm resting on a surface. The cuff of the blood pressure monitor will be placed directly against the skin of your upper arm at the level of your heart. It should be measured at least twice using the same arm. Certain conditions can cause a difference in blood pressure between your right and left arms. Certain factors can cause blood pressure readings to be lower or higher than normal (elevated) for a short period of time:  When your blood pressure is higher when you are in a health care provider's office than when you are at home, this is called white coat hypertension. Most people with this condition do not need medicines.  When your blood pressure is higher at home than when you are in a health care provider's office, this is called masked hypertension. Most people with this condition may need medicines to control blood pressure. If you have a high blood pressure reading during one visit or you have normal blood pressure with other risk factors:  You may be asked to return on a different day to have your blood pressure checked again.  You may be asked to monitor your blood pressure at home for 1 week or longer. If you are diagnosed with hypertension, you may have other blood or imaging tests to help your health care provider understand your overall risk for other conditions. How is this treated? This condition is treated by making healthy lifestyle changes, such as  eating healthy foods, exercising more, and reducing your alcohol intake. Your health care provider may prescribe medicine if lifestyle changes are not enough to get your blood pressure under control, and if:  Your systolic blood pressure is above 130.  Your diastolic blood pressure is above 80. Your personal target blood pressure may vary depending on your medical  conditions, your age, and other factors. Follow these instructions at home: Eating and drinking   Eat a diet that is high in fiber and potassium, and low in sodium, added sugar, and fat. An example eating plan is called the DASH (Dietary Approaches to Stop Hypertension) diet. To eat this way:  Eat plenty of fresh fruits and vegetables. Try to fill half of your plate at each meal with fruits and vegetables.  Eat whole grains, such as whole wheat pasta, brown rice, or whole grain bread. Fill about one quarter of your plate with whole grains.  Eat or drink low-fat dairy products, such as skim milk or low-fat yogurt.  Avoid fatty cuts of meat, processed or cured meats, and poultry with skin. Fill about one quarter of your plate with lean proteins, such as fish, chicken without skin, beans, eggs, and tofu.  Avoid premade and processed foods. These tend to be higher in sodium, added sugar, and fat.  Reduce your daily sodium intake. Most people with hypertension should eat less than 1,500 mg of sodium a day.  Limit alcohol intake to no more than 1 drink a day for nonpregnant women and 2 drinks a day for men. One drink equals 12 oz of beer, 5 oz of wine, or 1 oz of hard liquor. Lifestyle   Work with your health care provider to maintain a healthy body weight or to lose weight. Ask what an ideal weight is for you.  Get at least 30 minutes of exercise that causes your heart to beat faster (aerobic exercise) most days of the week. Activities may include walking, swimming, or biking.  Include exercise to strengthen your muscles (resistance exercise), such as pilates or lifting weights, as part of your weekly exercise routine. Try to do these types of exercises for 30 minutes at least 3 days a week.  Do not use any products that contain nicotine or tobacco, such as cigarettes and e-cigarettes. If you need help quitting, ask your health care provider.  Monitor your blood pressure at home as told by  your health care provider.  Keep all follow-up visits as told by your health care provider. This is important. Medicines   Take over-the-counter and prescription medicines only as told by your health care provider. Follow directions carefully. Blood pressure medicines must be taken as prescribed.  Do not skip doses of blood pressure medicine. Doing this puts you at risk for problems and can make the medicine less effective.  Ask your health care provider about side effects or reactions to medicines that you should watch for. Contact a health care provider if:  You think you are having a reaction to a medicine you are taking.  You have headaches that keep coming back (recurring).  You feel dizzy.  You have swelling in your ankles.  You have trouble with your vision. Get help right away if:  You develop a severe headache or confusion.  You have unusual weakness or numbness.  You feel faint.  You have severe pain in your chest or abdomen.  You vomit repeatedly.  You have trouble breathing. Summary  Hypertension is when the force of  blood pumping through your arteries is too strong. If this condition is not controlled, it may put you at risk for serious complications.  Your personal target blood pressure may vary depending on your medical conditions, your age, and other factors. For most people, a normal blood pressure is less than 120/80.  Hypertension is treated with lifestyle changes, medicines, or a combination of both. Lifestyle changes include weight loss, eating a healthy, low-sodium diet, exercising more, and limiting alcohol. This information is not intended to replace advice given to you by your health care provider. Make sure you discuss any questions you have with your health care provider. Document Released: 02/04/2005 Document Revised: 01/03/2016 Document Reviewed: 01/03/2016 Elsevier Interactive Patient Education  2017 [employer] Costochondritis Costochondritis is swelling and irritation (inflammation) of the tissue (cartilage) that connects your ribs to your breastbone (sternum). This causes pain in the front of your chest. Usually, the pain:  Starts gradually.  Is in more than one rib. This condition usually goes away on its own over time. Follow these instructions at home:  Do not do anything that makes your pain worse.  If directed, put ice on the painful area:  Put ice in a plastic bag.  Place a towel between your skin and the bag.  Leave the ice on for 20 minutes, 2-3 times a day.  If directed, put heat on the affected area as often as told by your doctor. Use the heat source that your doctor tells you to use, such as a moist heat pack or a heating pad.  Place a towel between your skin and the heat source.  Leave the heat on for 20-30 minutes.  Take off the heat if your skin turns bright red. This is very important if you cannot feel pain, heat, or cold. You may have a greater risk of getting burned.  Take over-the-counter and prescription medicines only as told by your doctor.  Return to your normal activities as told by your doctor. Ask your doctor what activities are safe for you.  Keep all follow-up visits as told by your doctor. This is important. Contact a doctor if:  You have chills or a fever.  Your pain does not go away or it gets worse.  You have a cough that does not go away. Get help right away if:  You are short of breath. This information is not intended to replace advice given to you by your health care provider. Make sure you discuss any questions you have with your health care provider. Document Released: 07/24/2007 Document Revised: 08/25/2015 Document Reviewed: 05/31/2015 Elsevier Interactive Patient Education  2017 [employer].

## 2016-06-05 ENCOUNTER — Other Ambulatory Visit: Payer: Self-pay | Admitting: Physician Assistant

## 2016-06-05 DIAGNOSIS — E559 Vitamin D deficiency, unspecified: Secondary | ICD-10-CM

## 2016-06-05 LAB — TSH: TSH: 1.2 u[IU]/mL (ref 0.450–4.500)

## 2016-06-05 LAB — VITAMIN D 25 HYDROXY (VIT D DEFICIENCY, FRACTURES): Vit D, 25-Hydroxy: 14.3 ng/mL — ABNORMAL LOW (ref 30.0–100.0)

## 2016-06-05 MED ORDER — VITAMIN D (ERGOCALCIFEROL) 1.25 MG (50000 UNIT) PO CAPS: 50000.0000 [IU] | ORAL_CAPSULE | ORAL | 0 refills | Status: DC

## 2016-06-06 ENCOUNTER — Telehealth: Payer: Self-pay | Admitting: Internal Medicine

## 2016-06-06 NOTE — Telephone Encounter (Signed)
Pt was seen on 06/04/16 by PA Freeman Caldron. Was prescribed med for pain in chest, med is not relieving pain, pt requesting another med for pain. Please f/u with pt.

## 2016-06-07 ENCOUNTER — Telehealth: Payer: Self-pay

## 2016-06-07 NOTE — Telephone Encounter (Signed)
Contacted pt to go over lab results pt is aware and doesn't have any questions or concerns 

## 2016-06-07 NOTE — Telephone Encounter (Signed)
Please advise on this concern.

## 2016-06-26 ENCOUNTER — Encounter: Payer: Self-pay | Admitting: Family Medicine

## 2016-06-26 ENCOUNTER — Ambulatory Visit: Payer: 59 | Attending: Family Medicine | Admitting: Family Medicine

## 2016-06-26 ENCOUNTER — Other Ambulatory Visit: Payer: Self-pay

## 2016-06-26 VITALS — BP 130/90 | HR 86 | Temp 98.1°F | Resp 18 | Ht 67.0 in | Wt 170.0 lb

## 2016-06-26 DIAGNOSIS — R9431 Abnormal electrocardiogram [ECG] [EKG]: Secondary | ICD-10-CM | POA: Diagnosis not present

## 2016-06-26 DIAGNOSIS — I10 Essential (primary) hypertension: Secondary | ICD-10-CM | POA: Diagnosis not present

## 2016-06-26 DIAGNOSIS — G8929 Other chronic pain: Secondary | ICD-10-CM

## 2016-06-26 DIAGNOSIS — R197 Diarrhea, unspecified: Secondary | ICD-10-CM | POA: Diagnosis not present

## 2016-06-26 DIAGNOSIS — R0789 Other chest pain: Secondary | ICD-10-CM | POA: Insufficient documentation

## 2016-06-26 DIAGNOSIS — F458 Other somatoform disorders: Secondary | ICD-10-CM | POA: Diagnosis not present

## 2016-06-26 DIAGNOSIS — F418 Other specified anxiety disorders: Secondary | ICD-10-CM | POA: Insufficient documentation

## 2016-06-26 MED ORDER — AMLODIPINE BESYLATE 2.5 MG PO TABS: 2.5000 mg | ORAL_TABLET | Freq: Every day | ORAL | 0 refills | Status: DC

## 2016-06-26 MED ORDER — FLUOXETINE HCL 10 MG PO CAPS: 10.0000 mg | ORAL_CAPSULE | Freq: Every day | ORAL | 0 refills | Status: DC

## 2016-06-26 MED ORDER — CULTURELLE DIGESTIVE HEALTH PO CAPS: 2.0000 | ORAL_CAPSULE | Freq: Two times a day (BID) | ORAL | Status: DC

## 2016-06-26 NOTE — Progress Notes (Signed)
Patient is here for HTN  Patient complains fatigue feeling tired right side headaches   Chest sharp tightness pain

## 2016-06-26 NOTE — Patient Instructions (Signed)
You will be called with your labs results.   Hypertension Hypertension is another name for high blood pressure. High blood pressure forces your heart to work harder to pump blood. This can cause problems over time. There are two numbers in a blood pressure reading. There is a top number (systolic) over a bottom number (diastolic). It is best to have a blood pressure below 120/80. Healthy choices can help lower your blood pressure. You may need medicine to help lower your blood pressure if:  Your blood pressure cannot be lowered with healthy choices.  Your blood pressure is higher than 130/80. Follow these instructions at home: Eating and drinking   If directed, follow the DASH eating plan. This diet includes:  Filling half of your plate at each meal with fruits and vegetables.  Filling one quarter of your plate at each meal with whole grains. Whole grains include whole wheat pasta, brown rice, and whole grain bread.  Eating or drinking low-fat dairy products, such as skim milk or low-fat yogurt.  Filling one quarter of your plate at each meal with low-fat (lean) proteins. Low-fat proteins include fish, skinless chicken, eggs, beans, and tofu.  Avoiding fatty meat, cured and processed meat, or chicken with skin.  Avoiding premade or processed food.  Eat less than 1,500 mg of salt (sodium) a day.  Limit alcohol use to no more than 1 drink a day for nonpregnant women and 2 drinks a day for men. One drink equals 12 oz of beer, 5 oz of wine, or 1 oz of hard liquor. Lifestyle   Work with your doctor to stay at a healthy weight or to lose weight. Ask your doctor what the best weight is for you.  Get at least 30 minutes of exercise that causes your heart to beat faster (aerobic exercise) most days of the week. This may include walking, swimming, or biking.  Get at least 30 minutes of exercise that strengthens your muscles (resistance exercise) at least 3 days a week. This may include  lifting weights or pilates.  Do not use any products that contain nicotine or tobacco. This includes cigarettes and e-cigarettes. If you need help quitting, ask your doctor.  Check your blood pressure at home as told by your doctor.  Keep all follow-up visits as told by your doctor. This is important. Medicines   Take over-the-counter and prescription medicines only as told by your doctor. Follow directions carefully.  Do not skip doses of blood pressure medicine. The medicine does not work as well if you skip doses. Skipping doses also puts you at risk for problems.  Ask your doctor about side effects or reactions to medicines that you should watch for. Contact a doctor if:  You think you are having a reaction to the medicine you are taking.  You have headaches that keep coming back (recurring).  You feel dizzy.  You have swelling in your ankles.  You have trouble with your vision. Get help right away if:  You get a very bad headache.  You start to feel confused.  You feel weak or numb.  You feel faint.  You get very bad pain in your:  Chest.  Belly (abdomen).  You throw up (vomit) more than once.  You have trouble breathing. Summary  Hypertension is another name for high blood pressure.  Making healthy choices can help lower blood pressure. If your blood pressure cannot be controlled with healthy choices, you may need to take medicine. This information is  not intended to replace advice given to you by your health care provider. Make sure you discuss any questions you have with your health care provider. Document Released: 07/24/2007 Document Revised: 01/03/2016 Document Reviewed: 01/03/2016 Elsevier Interactive Patient Education  2017 Else[employer]   Amlodipine tablets What is this medicine? AMLODIPINE (am LOE di peen) is a calcium-channel blocker. It affects the amount of calcium found in your heart and muscle cells. This relaxes your blood vessels, which  can reduce the amount of work the heart has to do. This medicine is used to lower high blood pressure. It is also used to prevent chest pain. This medicine may be used for other purposes; ask your health care provider or pharmacist if you have questions. COMMON BRAND NAME(S): Norvasc What should I tell my health care provider before I take this medicine? They need to know if you have any of these conditions: -heart problems like heart failure or aortic stenosis -liver disease -an unusual or allergic reaction to amlodipine, other medicines, foods, dyes, or preservatives -pregnant or trying to get pregnant -breast-feeding How should I use this medicine? Take this medicine by mouth with a glass of water. Follow the directions on the prescription label. Take your medicine at regular intervals. Do not take more medicine than directed. Talk to your pediatrician regarding the use of this medicine in children. Special care may be needed. This medicine has been used in children as young as 6. Persons over 31 years old may have a stronger reaction to this medicine and need smaller doses. Overdosage: If you think you have taken too much of this medicine contact a poison control center or emergency room at once. NOTE: This medicine is only for you. Do not share this medicine with others. What if I miss a dose? If you miss a dose, take it as soon as you can. If it is almost time for your next dose, take only that dose. Do not take double or extra doses. What may interact with this medicine? -herbal or dietary supplements -local or general anesthetics -medicines for high blood pressure -medicines for prostate problems -rifampin This list may not describe all possible interactions. Give your health care provider a list of all the medicines, herbs, non-prescription drugs, or dietary supplements you use. Also tell them if you smoke, drink alcohol, or use illegal drugs. Some items may interact with your  medicine. What should I watch for while using this medicine? Visit your doctor or health care professional for regular check ups. Check your blood pressure and pulse rate regularly. Ask your health care professional what your blood pressure and pulse rate should be, and when you should contact him or her. This medicine may make you feel confused, dizzy or lightheaded. Do not drive, use machinery, or do anything that needs mental alertness until you know how this medicine affects you. To reduce the risk of dizzy or fainting spells, do not sit or stand up quickly, especially if you are an older patient. Avoid alcoholic drinks; they can make you more dizzy. Do not suddenly stop taking amlodipine. Ask your doctor or health care professional how you can gradually reduce the dose. What side effects may I notice from receiving this medicine? Side effects that you should report to your doctor or health care professional as soon as possible: -allergic reactions like skin rash, itching or hives, swelling of the face, lips, or tongue -breathing problems -changes in vision or hearing -chest pain -fast, irregular heartbeat -swelling of legs  or ankles Side effects that usually do not require medical attention (report to your doctor or health care professional if they continue or are bothersome): -dry mouth -facial flushing -nausea, vomiting -stomach gas, pain -tired, weak -trouble sleeping This list may not describe all possible side effects. Call your doctor for medical advice about side effects. You may report side effects to FDA at 1-800-FDA-1088. Where should I keep my medicine? Keep out of the reach of children. Store at room temperature between 59 and 86 degrees F (15 and 30 degrees C). Protect from light. Keep container tightly closed. Throw away any unused medicine after the expiration date. NOTE: This sheet is a summary. It may not cover all possible information. If you have questions about this  medicine, talk to your doctor, pharmacist, or health care provider.  2018 Elsevier/Gold Standard (2012-01-03 11:40:58)     Fluoxetine capsules or tablets (Depression/Mood Disorders) What is this medicine? FLUOXETINE (floo OX e teen) belongs to a class of drugs known as selective serotonin reuptake inhibitors (SSRIs). It helps to treat mood problems such as depression, obsessive compulsive disorder, and panic attacks. It can also treat certain eating disorders. This medicine may be used for other purposes; ask your health care provider or pharmacist if you have questions. COMMON BRAND NAME(S): Prozac What should I tell my health care provider before I take this medicine? They need to know if you have any of these conditions: -bipolar disorder or a family history of bipolar disorder -bleeding disorders -glaucoma -heart disease -liver disease -low levels of sodium in the blood -seizures -suicidal thoughts, plans, or attempt; a previous suicide attempt by you or a family member -take MAOIs like Carbex, Eldepryl, Marplan, Nardil, and Parnate -take medicines that treat or prevent blood clots -thyroid disease -an unusual or allergic reaction to fluoxetine, other medicines, foods, dyes, or preservatives -pregnant or trying to get pregnant -breast-feeding How should I use this medicine? Take this medicine by mouth with a glass of water. Follow the directions on the prescription label. You can take this medicine with or without food. Take your medicine at regular intervals. Do not take it more often than directed. Do not stop taking this medicine suddenly except upon the advice of your doctor. Stopping this medicine too quickly may cause serious side effects or your condition may worsen. A special MedGuide will be given to you by the pharmacist with each prescription and refill. Be sure to read this information carefully each time. Talk to your pediatrician regarding the use of this medicine in  children. While this drug may be prescribed for children as young as 7 years for selected conditions, precautions do apply. Overdosage: If you think you have taken too much of this medicine contact a poison control center or emergency room at once. NOTE: This medicine is only for you. Do not share this medicine with others. What if I miss a dose? If you miss a dose, skip the missed dose and go back to your regular dosing schedule. Do not take double or extra doses. What may interact with this medicine? Do not take this medicine with any of the following medications: -other medicines containing fluoxetine, like Sarafem or Symbyax -cisapride -linezolid -MAOIs like Carbex, Eldepryl, Marplan, Nardil, and Parnate -methylene blue (injected into a vein) -pimozide -thioridazine This medicine may also interact with the following medications: -alcohol -amphetamines -aspirin and aspirin-like medicines -carbamazepine -certain medicines for depression, anxiety, or psychotic disturbances -certain medicines for migraine headaches like almotriptan, eletriptan, frovatriptan, naratriptan, rizatriptan,  sumatriptan, zolmitriptan -digoxin -diuretics -fentanyl -flecainide -furazolidone -isoniazid -lithium -medicines for sleep -medicines that treat or prevent blood clots like warfarin, enoxaparin, and dalteparin -NSAIDs, medicines for pain and inflammation, like ibuprofen or naproxen -phenytoin -procarbazine -propafenone -rasagiline -ritonavir -supplements like St. John's wort, kava kava, valerian -tramadol -tryptophan -vinblastine This list may not describe all possible interactions. Give your health care provider a list of all the medicines, herbs, non-prescription drugs, or dietary supplements you use. Also tell them if you smoke, drink alcohol, or use illegal drugs. Some items may interact with your medicine. What should I watch for while using this medicine? Tell your doctor if your symptoms  do not get better or if they get worse. Visit your doctor or health care professional for regular checks on your progress. Because it may take several weeks to see the full effects of this medicine, it is important to continue your treatment as prescribed by your doctor. Patients and their families should watch out for new or worsening thoughts of suicide or depression. Also watch out for sudden changes in feelings such as feeling anxious, agitated, panicky, irritable, hostile, aggressive, impulsive, severely restless, overly excited and hyperactive, or not being able to sleep. If this happens, especially at the beginning of treatment or after a change in dose, call your health care professional. Dennis Bast may get drowsy or dizzy. Do not drive, use machinery, or do anything that needs mental alertness until you know how this medicine affects you. Do not stand or sit up quickly, especially if you are an older patient. This reduces the risk of dizzy or fainting spells. Alcohol may interfere with the effect of this medicine. Avoid alcoholic drinks. Your mouth may get dry. Chewing sugarless gum or sucking hard candy, and drinking plenty of water may help. Contact your doctor if the problem does not go away or is severe. This medicine may affect blood sugar levels. If you have diabetes, check with your doctor or health care professional before you change your diet or the dose of your diabetic medicine. What side effects may I notice from receiving this medicine? Side effects that you should report to your doctor or health care professional as soon as possible: -allergic reactions like skin rash, itching or hives, swelling of the face, lips, or tongue -anxious -black, tarry stools -breathing problems -changes in vision -confusion -elevated mood, decreased need for sleep, racing thoughts, impulsive behavior -eye pain -fast, irregular heartbeat -feeling faint or lightheaded, falls -feeling agitated, angry, or  irritable -hallucination, loss of contact with reality -loss of balance or coordination -loss of memory -painful or prolonged erections -restlessness, pacing, inability to keep still -seizures -stiff muscles -suicidal thoughts or other mood changes -trouble sleeping -unusual bleeding or bruising -unusually weak or tired -vomiting Side effects that usually do not require medical attention (report to your doctor or health care professional if they continue or are bothersome): -change in appetite or weight -change in sex drive or performance -diarrhea -dry mouth -headache -increased sweating -nausea -tremors This list may not describe all possible side effects. Call your doctor for medical advice about side effects. You may report side effects to FDA at 1-800-FDA-1088. Where should I keep my medicine? Keep out of the reach of children. Store at room temperature between 15 and 30 degrees C (59 and 86 degrees F). Throw away any unused medicine after the expiration date. NOTE: This sheet is a summary. It may not cover all possible information. If you have questions about this medicine, talk to  your doctor, pharmacist, or health care provider.  2018 Elsevier/Gold Standard (2015-07-08 15:55:27)

## 2016-06-26 NOTE — Progress Notes (Signed)
Subjective:  Patient ID: Brandy [name], female    DOB: [birth year]  Age: 46 y.o. MRN: [id number]  CC: Hypertension   HPI Brandy [name] presents for   Hypertension: Patient here for follow-up of elevated blood pressure. She is exercising and is adherent to low salt diet.  Blood pressure She does not check her blood pressures at home. Cardiac symptoms none. Patient denies chest pain, claudication, dyspnea, lower extremity edema, palpitations and syncope.  Cardiovascular risk factors: hypertension and sedentary lifestyle. Use of agents associated with hypertension: none. History of target organ damage: none.   Anxiety: Patient complains of anxiety disorder.  She has the following symptoms: difficulty concentrating, feelings of losing control, palpitations, racing thoughts, loose stools. Onset of symptoms was approximately several years ago, gradually worsening since that time. She denies current suicidal and homicidal ideation. Possible organic causes contributing are: none. Risk factors: none Previous treatment includes none.  She complains of the following side effects from the treatment: none. She request medication at this time. She is agreeable to counseling.      Outpatient Medications Prior to Visit  Medication Sig Dispense Refill  . hydrochlorothiazide (HYDRODIURIL) 25 MG tablet Take 1 tablet (25 mg total) by mouth daily. 90 tablet 0  . Vitamin D, Ergocalciferol, (DRISDOL) 50000 units CAPS capsule Take 1 capsule (50,000 Units total) by mouth every 7 (seven) days. 16 capsule 0  . meloxicam (MOBIC) 15 MG tablet Take 1 tablet (15 mg total) by mouth daily. X 10 days then prn pain (Patient not taking: Reported on 06/26/2016) 30 tablet 1  . methocarbamol (ROBAXIN) 500 MG tablet Take 1 tablet (500 mg total) by mouth 3 (three) times daily. X 10 days then prn muscle spasm (Patient not taking: Reported on 06/26/2016) 90 tablet 0   No facility-administered medications prior to visit.      ROS Review of Systems  Constitutional: Negative.   Respiratory: Negative.   Cardiovascular: Positive for palpitations.  Gastrointestinal: Negative.   Skin: Negative.   Psychiatric/Behavioral: The patient is nervous/anxious.     Objective:  BP 130/90 (BP Location: Left Arm, Patient Position: Sitting, Cuff Size: Normal)   Pulse 86   Temp 98.1 F (36.7 C) (Oral)   Resp 18   Ht 5\' 7"  (1.702 m)   Wt 170 lb (77.1 kg)   SpO2 98%   BMI 26.63 kg/m   BP/Weight 06/26/2016 06/04/2016 1/50/5697  Systolic BP 948 016 553  Diastolic BP 90 748 270  Wt. (Lbs) 170 170 -  BMI 26.63 26.63 -    Depression screen [facility] 2/9 06/26/2016 06/04/2016 06/04/2016  Decreased Interest 2 2 2   Down, Depressed, Hopeless 2 2 2   PHQ - 2 Score 4 4 4   Altered sleeping 3 3 3   Tired, decreased energy 3 3 3   Change in appetite 2 2 2   Feeling bad or failure about yourself  1 2 2   Trouble concentrating 2 3 3   Moving slowly or fidgety/restless 2 1 1   Suicidal thoughts 0 0 0  PHQ-9 Score 17 18 18     GAD 7 : Generalized Anxiety Score 06/26/2016 06/04/2016 06/04/2016  Nervous, Anxious, on Edge 2 3 3   Control/stop worrying 2 3 3   Worry too much - different things 3 3 3   Trouble relaxing 3 2 2   Restless 2 2 2   Easily annoyed or irritable 3 3 3   Afraid - awful might happen 2 1 1   Total GAD 7 Score 17 17 17  Physical Exam  Constitutional: She appears well-developed and well-nourished.  HENT:  Head: Normocephalic and atraumatic.  Right Ear: External ear normal.  Left Ear: External ear normal.  Nose: Nose normal.  Mouth/Throat: Oropharynx is clear and moist.  Eyes: Conjunctivae are normal. Pupils are equal, round, and reactive to light.  Neck: Normal range of motion. No JVD present.  Cardiovascular: Normal rate, regular rhythm, normal heart sounds and intact distal pulses.   Pulmonary/Chest: Effort normal and breath sounds normal.  Abdominal: Soft. Bowel sounds are normal.  Lymphadenopathy:    She has no  cervical adenopathy.  Skin: Skin is warm and dry.  Psychiatric: Her mood appears anxious. She expresses no homicidal and no suicidal ideation. She expresses no suicidal plans and no homicidal plans.  Nursing note and vitals reviewed.  Assessment & Plan:   Problem List Items Addressed This Visit    None    Visit Diagnoses    Chronic chest wall pain    -  Primary   Relevant Medications   FLUoxetine (PROZAC) 10 MG capsule   Other Relevant Orders   EKG 12-INOM   Basic Metabolic Panel (Completed)   CBC with Differential (Completed)   Anxiety with depression       Relevant Medications   FLUoxetine (PROZAC) 10 MG capsule   Other Relevant Orders   Ambulatory referral to Psychology   Essential hypertension       Relevant Medications   amLODipine (NORVASC) 2.5 MG tablet   Emotional diarrhea       Relevant Medications   Lactobacillus-Inulin ([place]) CAPS   ECG abnormality       Relevant Orders   ECHOCARDIOGRAM COMPLETE      Meds ordered this encounter  Medications  . Lactobacillus-Inulin ([place]) CAPS    Sig: Take 2 capsules by mouth 2 (two) times daily.    Order Specific Question:   Supervising Provider    Answer:   Tresa Garter [id number]  . amLODipine (NORVASC) 2.5 MG tablet    Sig: Take 1 tablet (2.5 mg total) by mouth daily.    Dispense:  90 tablet    Refill:  0    Order Specific Question:   Supervising Provider    Answer:   Tresa Garter [id number]  . FLUoxetine (PROZAC) 10 MG capsule    Sig: Take 1 capsule (10 mg total) by mouth daily.    Dispense:  60 capsule    Refill:  0    Order Specific Question:   Supervising Provider    Answer:   Tresa Garter [[id number]]    Follow-up: Return in about 8 weeks (around 08/21/2016) for Anxiety/Dep. Alfonse Spruce FNP

## 2016-06-27 LAB — BASIC METABOLIC PANEL
BUN/Creatinine Ratio: 16 (ref 9–23)
BUN: 16 mg/dL (ref 6–24)
CO2: 28 mmol/L (ref 18–29)
Calcium: 9.8 mg/dL (ref 8.7–10.2)
Chloride: 99 mmol/L (ref 96–106)
Creatinine, Ser: 1.01 mg/dL — ABNORMAL HIGH (ref 0.57–1.00)
GFR calc Af Amer: 78 mL/min/{1.73_m2} (ref >59)
GFR calc non Af Amer: 67 mL/min/{1.73_m2} (ref >59)
Glucose: 80 mg/dL (ref 65–99)
Potassium: 4.1 mmol/L (ref 3.5–5.2)
Sodium: 142 mmol/L (ref 134–144)

## 2016-06-27 LAB — CBC WITH DIFFERENTIAL/PLATELET
Basophils Absolute: 0 10*3/uL (ref 0.0–0.2)
Basos: 0 %
EOS (ABSOLUTE): 0 10*3/uL (ref 0.0–0.4)
Eos: 0 %
Hematocrit: 45.7 % (ref 34.0–46.6)
Hemoglobin: 15.8 g/dL (ref 11.1–15.9)
Immature Grans (Abs): 0 10*3/uL (ref 0.0–0.1)
Immature Granulocytes: 0 %
Lymphocytes Absolute: 1.7 10*3/uL (ref 0.7–3.1)
Lymphs: 22 %
MCH: 30.3 pg (ref 26.6–33.0)
MCHC: 34.6 g/dL (ref 31.5–35.7)
MCV: 88 fL (ref 79–97)
Monocytes Absolute: 0.5 10*3/uL (ref 0.1–0.9)
Monocytes: 7 %
Neutrophils Absolute: 5.5 10*3/uL (ref 1.4–7.0)
Neutrophils: 71 %
Platelets: 265 10*3/uL (ref 150–379)
RBC: 5.22 x10E6/uL (ref 3.77–5.28)
RDW: 14 % (ref 12.3–15.4)
WBC: 7.8 10*3/uL (ref 3.4–10.8)

## 2016-07-04 ENCOUNTER — Telehealth: Payer: Self-pay

## 2016-07-04 NOTE — Telephone Encounter (Signed)
CMA call patient regarding lab results  Patient did not answer but left a VM stating the reason of the call & to call me back

## 2016-07-04 NOTE — Telephone Encounter (Signed)
-----   Message from Alfonse Spruce, [place] sent at 07/03/2016  8:44 AM EDT ----- Kidney function normal Labs that evaluated your blood cells, fluid and electrolyte balance are normal. No signs of anemia, infection, or inflammation present.

## 2016-07-09 ENCOUNTER — Ambulatory Visit (HOSPITAL_COMMUNITY)
Admission: RE | Admit: 2016-07-09 | Discharge: 2016-07-09 | Disposition: A | Discharge status: Home / Self Care | Payer: 59 | Source: Ambulatory Visit | Attending: Family Medicine | Admitting: Family Medicine

## 2016-07-09 DIAGNOSIS — R079 Chest pain, unspecified: Secondary | ICD-10-CM | POA: Insufficient documentation

## 2016-07-09 DIAGNOSIS — R9431 Abnormal electrocardiogram [ECG] [EKG]: Secondary | ICD-10-CM | POA: Diagnosis not present

## 2016-07-09 LAB — ECHOCARDIOGRAM COMPLETE
E decel time: 243 msec
E/e' ratio: 4.45
FS: 35 % (ref 28–44)
IVS/LV PW RATIO, ED: 0.98
LA ID, A-P, ES: 27 mm
LA diam end sys: 27 mm
LA diam index: 1.43 cm/m2
LA vol A4C: 29.6 ml
LA vol index: 15.4 mL/m2
LA vol: 29.2 mL
LV E/e' medial: 4.45
LV E/e'average: 4.45
LV PW d: 8.99 mm — AB (ref 0.6–1.1)
LV e' LATERAL: 15.7 cm/s
LVOT SV: 77 mL
LVOT VTI: 24.6 cm
LVOT area: 3.14 cm2
LVOT diameter: 20 mm
LVOT peak grad rest: 5 mmHg
LVOT peak vel: 114 cm/s
Lateral S' vel: 14.1 cm/s
MV Dec: 243
MV pk A vel: 79.3 m/s
MV pk E vel: 69.8 m/s
TAPSE: 23.8 mm
TDI e' lateral: 15.7
TDI e' medial: 11.3

## 2016-07-09 NOTE — Progress Notes (Signed)
  Echocardiogram 2D Echocardiogram has been performed.  Brandy [name] 07/09/2016, 4:08 PM

## 2016-07-16 ENCOUNTER — Telehealth: Payer: Self-pay

## 2016-07-16 NOTE — Telephone Encounter (Signed)
-----   Message from Alfonse Spruce, [place] sent at 07/12/2016 11:34 AM EDT ----- -Echocardiogram which looks at your heart structure and function showed normal pumping ability of the heart. Heart size is normal. No leaking or narrowing of the heart valves.

## 2016-07-16 NOTE — Telephone Encounter (Signed)
CMA call regarding echo results  Patient did not answer but left a detailed message & stated that if have any questions just to call back

## 2016-07-18 NOTE — Telephone Encounter (Signed)
Patient return CMA call   Patient Verify DOB   Patient was aware and understood  

## 2016-07-19 ENCOUNTER — Other Ambulatory Visit: Payer: Self-pay

## 2016-07-19 ENCOUNTER — Ambulatory Visit (HOSPITAL_COMMUNITY)
Admission: RE | Admit: 2016-07-19 | Discharge: 2016-07-19 | Disposition: A | Discharge status: Home / Self Care | Payer: 59 | Source: Ambulatory Visit | Attending: Family Medicine | Admitting: Family Medicine

## 2016-07-19 ENCOUNTER — Encounter: Payer: Self-pay | Admitting: Family Medicine

## 2016-07-19 ENCOUNTER — Ambulatory Visit: Payer: 59 | Attending: Family Medicine | Admitting: Family Medicine

## 2016-07-19 VITALS — BP 113/82 | HR 80 | Temp 98.2°F | Resp 18 | Ht 67.0 in | Wt 161.6 lb

## 2016-07-19 DIAGNOSIS — F418 Other specified anxiety disorders: Secondary | ICD-10-CM | POA: Diagnosis not present

## 2016-07-19 DIAGNOSIS — R0602 Shortness of breath: Secondary | ICD-10-CM

## 2016-07-19 DIAGNOSIS — R079 Chest pain, unspecified: Secondary | ICD-10-CM | POA: Insufficient documentation

## 2016-07-19 NOTE — Progress Notes (Signed)
Subjective:  Patient ID: Brandy [name], female    DOB: 10/19/1970  Age: 46 y.o. MRN: [id number]  CC: Chest Pain   HPI Brandy [name] presents for c/o CP and SOB. Reports symptoms began last week. Denies any radiating or left sided chest pain or jaw pain, heartburn, or N/V. She denies any wheezing or SOB with exertion. She does have a history of anxiety. She has the following symptoms: difficulty concentrating, feelings of losing control, palpitations,and  racing thoughts. Onset of symptoms was approximately several years ago, gradually worsening since that time. She denies current suicidal and homicidal ideation. Possible organic causes contributing are: none. Risk factors: none. Previous treatment includes Prozac that was prescribed at last visit. She defines any side effects from the treatment. She request medication at this time.     Outpatient Medications Prior to Visit  Medication Sig Dispense Refill  . amLODipine (NORVASC) 2.5 MG tablet Take 1 tablet (2.5 mg total) by mouth daily. 90 tablet 0  . FLUoxetine (PROZAC) 10 MG capsule Take 1 capsule (10 mg total) by mouth daily. 60 capsule 0  . hydrochlorothiazide (HYDRODIURIL) 25 MG tablet Take 1 tablet (25 mg total) by mouth daily. 90 tablet 0  . Lactobacillus-Inulin (Pioneer) CAPS Take 2 capsules by mouth 2 (two) times daily.    . meloxicam (MOBIC) 15 MG tablet Take 1 tablet (15 mg total) by mouth daily. X 10 days then prn pain (Patient not taking: Reported on 06/26/2016) 30 tablet 1  . methocarbamol (ROBAXIN) 500 MG tablet Take 1 tablet (500 mg total) by mouth 3 (three) times daily. X 10 days then prn muscle spasm (Patient not taking: Reported on 06/26/2016) 90 tablet 0  . Vitamin D, Ergocalciferol, (DRISDOL) 50000 units CAPS capsule Take 1 capsule (50,000 Units total) by mouth every 7 (seven) days. 16 capsule 0   No facility-administered medications prior to visit.     ROS Review of Systems  Constitutional:  Negative.   Respiratory: Positive for shortness of breath.   Cardiovascular: Positive for chest pain.  Gastrointestinal: Negative.   Musculoskeletal: Negative.   Psychiatric/Behavioral: The patient is nervous/anxious.     Objective:  BP 113/82 (BP Location: Left Arm, Patient Position: Sitting, Cuff Size: Normal)   Pulse 80   Temp 98.2 F (36.8 C) (Oral)   Resp 18   Ht 5\' 7"  (1.702 m)   Wt 161 lb 9.6 oz (73.3 kg)   SpO2 97%   BMI 25.31 kg/m   BP/Weight 07/19/2016 06/26/2016 2/35/3614  Systolic BP 431 540 086  Diastolic BP 82 90 761  Wt. (Lbs) 161.6 170 170  BMI 25.31 26.63 26.63     Physical Exam  Constitutional: She appears well-developed and well-nourished.  Neck: No JVD present.  Cardiovascular: Normal rate, regular rhythm, normal heart sounds and intact distal pulses.   Pulmonary/Chest: Effort normal and breath sounds normal.  Abdominal: Soft. Bowel sounds are normal.  Skin: Skin is warm and dry.  Psychiatric: Her speech is normal. Her mood appears anxious. She expresses no homicidal and no suicidal ideation. She expresses no suicidal plans and no homicidal plans. She is inattentive.  Nursing note and vitals reviewed.   Assessment & Plan:   Problem List Items Addressed This Visit    None    Visit Diagnoses    Anxiety with depression    -  Primary   Too soon to increase SSRI dosage.Recommend follow up in 3 weeks   Referral previously made for psychology. Pt.declined.  Counseling resources provided.   Chest pain, unspecified type       EKG in office negative for ACS, suggest symptoms related to anxiety.   Relevant Orders   EKG 12-Lead   SOB (shortness of breath)       Relevant Orders   EKG 12-Lead        Follow-up: Return in about 3 weeks (around 08/09/2016) for Anxiety/Depression.   Alfonse Spruce FNP

## 2016-07-19 NOTE — Patient Instructions (Signed)
Living With Anxiety After being diagnosed with an anxiety disorder, you may be relieved to know why you have felt or behaved a certain way. It is natural to also feel overwhelmed about the treatment ahead and what it will mean for your life. With care and support, you can manage this condition and recover from it. How to cope with anxiety Dealing with stress Stress is your body's reaction to life changes and events, both good and bad. Stress can last just a few hours or it can be ongoing. Stress can play a major role in anxiety, so it is important to learn both how to cope with stress and how to think about it differently. Talk with your health care provider or a counselor to learn more about stress reduction. He or she may suggest some stress reduction techniques, such as:  Music therapy. This can include creating or listening to music that you enjoy and that inspires you.  Mindfulness-based meditation. This involves being aware of your normal breaths, rather than trying to control your breathing. It can be done while sitting or walking.  Centering prayer. This is a kind of meditation that involves focusing on a word, phrase, or sacred image that is meaningful to you and that brings you peace.  Deep breathing. To do this, expand your stomach and inhale slowly through your nose. Hold your breath for 3-5 seconds. Then exhale slowly, allowing your stomach muscles to relax.  Self-talk. This is a skill where you identify thought patterns that lead to anxiety reactions and correct those thoughts.  Muscle relaxation. This involves tensing muscles then relaxing them.  Choose a stress reduction technique that fits your lifestyle and personality. Stress reduction techniques take time and practice. Set aside 5-15 minutes a day to do them. Therapists can offer training in these techniques. The training may be covered by some insurance plans. Other things you can do to manage stress include:  Keeping a  stress diary. This can help you learn what triggers your stress and ways to control your response.  Thinking about how you respond to certain situations. You may not be able to control everything, but you can control your reaction.  Making time for activities that help you relax, and not feeling guilty about spending your time in this way.  Therapy combined with coping and stress-reduction skills provides the best chance for successful treatment. Medicines Medicines can help ease symptoms. Medicines for anxiety include:  Anti-anxiety drugs.  Antidepressants.  Beta-blockers.  Medicines may be used as the main treatment for anxiety disorder, along with therapy, or if other treatments are not working. Medicines should be prescribed by a health care provider. Relationships Relationships can play a big part in helping you recover. Try to spend more time connecting with trusted friends and family members. Consider going to couples counseling, taking family education classes, or going to family therapy. Therapy can help you and others better understand the condition. How to recognize changes in your condition Everyone has a different response to treatment for anxiety. Recovery from anxiety happens when symptoms decrease and stop interfering with your daily activities at home or work. This may mean that you will start to:  Have better concentration and focus.  Sleep better.  Be less irritable.  Have more energy.  Have improved memory.  It is important to recognize when your condition is getting worse. Contact your health care provider if your symptoms interfere with home or work and you do not feel like your condition   is improving. Where to find help and support: You can get help and support from these sources:  Self-help groups.  Online and [employer].  A trusted spiritual leader.  Couples counseling.  Family education classes.  Family therapy.  Follow these  instructions at home:  Eat a healthy diet that includes plenty of vegetables, fruits, whole grains, low-fat dairy products, and lean protein. Do not eat a lot of foods that are high in solid fats, added sugars, or salt.  Exercise. Most adults should do the following: ? Exercise for at least 150 minutes each week. The exercise should increase your heart rate and make you sweat (moderate-intensity exercise). ? Strengthening exercises at least twice a week.  Cut down on caffeine, tobacco, alcohol, and other potentially harmful substances.  Get the right amount and quality of sleep. Most adults need 7-9 hours of sleep each night.  Make choices that simplify your life.  Take over-the-counter and prescription medicines only as told by your health care provider.  Avoid caffeine, alcohol, and certain over-the-counter cold medicines. These may make you feel worse. Ask your pharmacist which medicines to avoid.  Keep all follow-up visits as told by your health care provider. This is important. Questions to ask your health care provider  Would I benefit from therapy?  How often should I follow up with a health care provider?  How long do I need to take medicine?  Are there any long-term side effects of my medicine?  Are there any alternatives to taking medicine? Contact a health care provider if:  You have a hard time staying focused or finishing daily tasks.  You spend many hours a day feeling worried about everyday life.  You become exhausted by worry.  You start to have headaches, feel tense, or have nausea.  You urinate more than normal.  You have diarrhea. Get help right away if:  You have a racing heart and shortness of breath.  You have thoughts of hurting yourself or others. If you ever feel like you may hurt yourself or others, or have thoughts about taking your own life, get help right away. You can go to your nearest emergency department or call:  Your local emergency  services (911 in the [place]  A suicide crisis helpline, such as the [facility] at 620-862-4819. This is open 24-hours a day.  Summary  Taking steps to deal with stress can help calm you.  Medicines cannot cure anxiety disorders, but they can help ease symptoms.  Family, friends, and partners can play a big part in helping you recover from an anxiety disorder. This information is not intended to replace advice given to you by your health care provider. Make sure you discuss any questions you have with your health care provider. Document Released: 01/30/2016 Document Revised: 01/30/2016 Document Reviewed: 01/30/2016 Elsevier Interactive Patient Education  2018 [place] is a condition in which a person feels unpleasant or uncomfortable. It may involve mood changes and feelings of sadness, anxiety, irritability, and restlessness. Dysphoria is often caused by normal life stress and it usually goes away within several days. Dysphoria that lasts longer than several days may be a symptom of a mental disorder, such as major depression or bipolar disorder. Follow these instructions at home: Monitor your mood for any changes. Take these steps to help with your discomfort and unpleasant feelings:  Take over-the-counter and prescription medicines only as told by your health care provider.  Check with your  health care provider before taking any herbs or supplements.  Keep all follow-up visits as told by your health care provider.This is important.  Maintain a healthy lifestyle. ? Eat a healthy diet. ? Exercise regularly. ? Get plenty of sleep.  Avoid alcohol and drugs.  Learn ways to reduce stress and cope with stress, such as with yoga and meditation.  Talk about your feelings with family members or health care providers.  Make time for yourself to do things that you enjoy.  Contact a health care provider if:  You were given  medicine and it does not seem to be helping.  You feel hopeless and overwhelmed.  You feel like you cannot leave your house.  You have trouble taking care of yourself. Get help right away if:  You have serious thoughts about hurting yourself or others. This information is not intended to replace advice given to you by your health care provider. Make sure you discuss any questions you have with your health care provider. Document Released: 07/16/2005 Document Revised: 07/13/2015 Document Reviewed: 12/18/2013 Elsevier Interactive Patient Education  2018 [employer].

## 2016-07-19 NOTE — Progress Notes (Signed)
Patient is here for shortness of breathe & chest pain  Peak meter flow 170

## 2016-08-09 ENCOUNTER — Encounter: Payer: Self-pay | Admitting: Family Medicine

## 2016-08-09 ENCOUNTER — Ambulatory Visit: Payer: 59 | Attending: Family Medicine | Admitting: Family Medicine

## 2016-08-09 VITALS — BP 124/84 | HR 75 | Temp 98.2°F | Resp 16 | Wt 160.4 lb

## 2016-08-09 DIAGNOSIS — I10 Essential (primary) hypertension: Secondary | ICD-10-CM | POA: Diagnosis not present

## 2016-08-09 DIAGNOSIS — F419 Anxiety disorder, unspecified: Secondary | ICD-10-CM | POA: Diagnosis present

## 2016-08-09 DIAGNOSIS — Z79899 Other long term (current) drug therapy: Secondary | ICD-10-CM | POA: Insufficient documentation

## 2016-08-09 DIAGNOSIS — F418 Other specified anxiety disorders: Secondary | ICD-10-CM | POA: Diagnosis not present

## 2016-08-09 MED ORDER — FLUOXETINE HCL 20 MG PO CAPS: 20.0000 mg | ORAL_CAPSULE | Freq: Every day | ORAL | 0 refills | Status: DC

## 2016-08-09 NOTE — Progress Notes (Signed)
Pt states her anxiety and depression is getting a little better Pt states she feels that the medicine she is taking is not helping

## 2016-08-09 NOTE — Patient Instructions (Addendum)
Follow up with licensed clinical social worker. Schedule lab appointment for cholesterol check due to hypertension. Must be fasting at least 4-8 hours prior.   Living With Anxiety After being diagnosed with an anxiety disorder, you may be relieved to know why you have felt or behaved a certain way. It is natural to also feel overwhelmed about the treatment ahead and what it will mean for your life. With care and support, you can manage this condition and recover from it. How to cope with anxiety Dealing with stress Stress is your body's reaction to life changes and events, both good and bad. Stress can last just a few hours or it can be ongoing. Stress can play a major role in anxiety, so it is important to learn both how to cope with stress and how to think about it differently. Talk with your health care provider or a counselor to learn more about stress reduction. He or she may suggest some stress reduction techniques, such as:  Music therapy. This can include creating or listening to music that you enjoy and that inspires you.  Mindfulness-based meditation. This involves being aware of your normal breaths, rather than trying to control your breathing. It can be done while sitting or walking.  Centering prayer. This is a kind of meditation that involves focusing on a word, phrase, or sacred image that is meaningful to you and that brings you peace.  Deep breathing. To do this, expand your stomach and inhale slowly through your nose. Hold your breath for 3-5 seconds. Then exhale slowly, allowing your stomach muscles to relax.  Self-talk. This is a skill where you identify thought patterns that lead to anxiety reactions and correct those thoughts.  Muscle relaxation. This involves tensing muscles then relaxing them.  Choose a stress reduction technique that fits your lifestyle and personality. Stress reduction techniques take time and practice. Set aside 5-15 minutes a day to do them.  Therapists can offer training in these techniques. The training may be covered by some insurance plans. Other things you can do to manage stress include:  Keeping a stress diary. This can help you learn what triggers your stress and ways to control your response.  Thinking about how you respond to certain situations. You may not be able to control everything, but you can control your reaction.  Making time for activities that help you relax, and not feeling guilty about spending your time in this way.  Therapy combined with coping and stress-reduction skills provides the best chance for successful treatment. Medicines Medicines can help ease symptoms. Medicines for anxiety include:  Anti-anxiety drugs.  Antidepressants.  Beta-blockers.  Medicines may be used as the main treatment for anxiety disorder, along with therapy, or if other treatments are not working. Medicines should be prescribed by a health care provider. Relationships Relationships can play a big part in helping you recover. Try to spend more time connecting with trusted friends and family members. Consider going to couples counseling, taking family education classes, or going to family therapy. Therapy can help you and others better understand the condition. How to recognize changes in your condition Everyone has a different response to treatment for anxiety. Recovery from anxiety happens when symptoms decrease and stop interfering with your daily activities at home or work. This may mean that you will start to:  Have better concentration and focus.  Sleep better.  Be less irritable.  Have more energy.  Have improved memory.  It is important to recognize when  your condition is getting worse. Contact your health care provider if your symptoms interfere with home or work and you do not feel like your condition is improving. Where to find help and support: You can get help and support from these sources:  Self-help  groups.  Online and [employer].  A trusted spiritual leader.  Couples counseling.  Family education classes.  Family therapy.  Follow these instructions at home:  Eat a healthy diet that includes plenty of vegetables, fruits, whole grains, low-fat dairy products, and lean protein. Do not eat a lot of foods that are high in solid fats, added sugars, or salt.  Exercise. Most adults should do the following: ? Exercise for at least 150 minutes each week. The exercise should increase your heart rate and make you sweat (moderate-intensity exercise). ? Strengthening exercises at least twice a week.  Cut down on caffeine, tobacco, alcohol, and other potentially harmful substances.  Get the right amount and quality of sleep. Most adults need 7-9 hours of sleep each night.  Make choices that simplify your life.  Take over-the-counter and prescription medicines only as told by your health care provider.  Avoid caffeine, alcohol, and certain over-the-counter cold medicines. These may make you feel worse. Ask your pharmacist which medicines to avoid.  Keep all follow-up visits as told by your health care provider. This is important. Questions to ask your health care provider  Would I benefit from therapy?  How often should I follow up with a health care provider?  How long do I need to take medicine?  Are there any long-term side effects of my medicine?  Are there any alternatives to taking medicine? Contact a health care provider if:  You have a hard time staying focused or finishing daily tasks.  You spend many hours a day feeling worried about everyday life.  You become exhausted by worry.  You start to have headaches, feel tense, or have nausea.  You urinate more than normal.  You have diarrhea. Get help right away if:  You have a racing heart and shortness of breath.  You have thoughts of hurting yourself or others. If you ever feel like you may hurt  yourself or others, or have thoughts about taking your own life, get help right away. You can go to your nearest emergency department or call:  Your local emergency services (911 in the [place]  A suicide crisis helpline, such as the [place] at 604-376-4798. This is open 24-hours a day.  Summary  Taking steps to deal with stress can help calm you.  Medicines cannot cure anxiety disorders, but they can help ease symptoms.  Family, friends, and partners can play a big part in helping you recover from an anxiety disorder. This information is not intended to replace advice given to you by your health care provider. Make sure you discuss any questions you have with your health care provider. Document Released: 01/30/2016 Document Revised: 01/30/2016 Document Reviewed: 01/30/2016 Elsevier Interactive Patient Education  2018 [place] With Depression Everyone experiences occasional disappointment, sadness, and loss in their lives. When you are feeling down, blue, or sad for at least 2 weeks in a row, it may mean that you have depression. Depression can affect your thoughts and feelings, relationships, daily activities, and physical health. It is caused by changes in the way your brain functions. If you receive a diagnosis of depression, your health care provider will tell you which type of depression  you have and what treatment options are available to you. If you are living with depression, there are ways to help you recover from it and also ways to prevent it from coming back. How to cope with lifestyle changes Coping with stress Stress is your body's reaction to life changes and events, both good and bad. Stressful situations may include:  Getting married.  The death of a spouse.  Losing a job.  Retiring.  Having a baby.  Stress can last just a few hours or it can be ongoing. Stress can play a major role in depression, so it is important to  learn both how to cope with stress and how to think about it differently. Talk with your health care provider or a counselor if you would like to learn more about stress reduction. He or she may suggest some stress reduction techniques, such as:  Music therapy. This can include creating music or listening to music. Choose music that you enjoy and that inspires you.  Mindfulness-based meditation. This kind of meditation can be done while sitting or walking. It involves being aware of your normal breaths, rather than trying to control your breathing.  Centering prayer. This is a kind of meditation that involves focusing on a spiritual word or phrase. Choose a word, phrase, or sacred image that is meaningful to you and that brings you peace.  Deep breathing. To do this, expand your stomach and inhale slowly through your nose. Hold your breath for 3-5 seconds, then exhale slowly, allowing your stomach muscles to relax.  Muscle relaxation. This involves intentionally tensing muscles then relaxing them.  Choose a stress reduction technique that fits your lifestyle and personality. Stress reduction techniques take time and practice to develop. Set aside 5-15 minutes a day to do them. Therapists can offer training in these techniques. The training may be covered by some insurance plans. Other things you can do to manage stress include:  Keeping a stress diary. This can help you learn what triggers your stress and ways to control your response.  Understanding what your limits are and saying no to requests or events that lead to a schedule that is too full.  Thinking about how you respond to certain situations. You may not be able to control everything, but you can control how you react.  Adding humor to your life by watching funny films or TV shows.  Making time for activities that help you relax and not feeling guilty about spending your time this way.  Medicines Your health care provider may  suggest certain medicines if he or she feels that they will help improve your condition. Avoid using alcohol and other substances that may prevent your medicines from working properly (may interact). It is also important to:  Talk with your pharmacist or health care provider about all the medicines that you take, their possible side effects, and what medicines are safe to take together.  Make it your goal to take part in all treatment decisions (shared decision-making). This includes giving input on the side effects of medicines. It is best if shared decision-making with your health care provider is part of your total treatment plan.  If your health care provider prescribes a medicine, you may not notice the full benefits of it for 4-8 weeks. Most people who are treated for depression need to be on medicine for at least 6-12 months after they feel better. If you are taking medicines as part of your treatment, do not stop taking  medicines without first talking to your health care provider. You may need to have the medicine slowly decreased (tapered) over time to decrease the risk of harmful side effects. Relationships Your health care provider may suggest family therapy along with individual therapy and drug therapy. While there may not be family problems that are causing you to feel depressed, it is still important to make sure your family learns as much as they can about your mental health. Having your family's support can help make your treatment successful. How to recognize changes in your condition Everyone has a different response to treatment for depression. Recovery from major depression happens when you have not had signs of major depression for two months. This may mean that you will start to:  Have more interest in doing activities.  Feel less hopeless than you did 2 months ago.  Have more energy.  Overeat less often, or have better or improving appetite.  Have better  concentration.  Your health care provider will work with you to decide the next steps in your recovery. It is also important to recognize when your condition is getting worse. Watch for these signs:  Having fatigue or low energy.  Eating too much or too little.  Sleeping too much or too little.  Feeling restless, agitated, or hopeless.  Having trouble concentrating or making decisions.  Having unexplained physical complaints.  Feeling irritable, angry, or aggressive.  Get help as soon as you or your family members notice these symptoms coming back. How to get support and help from others How to talk with friends and family members about your condition Talking to friends and family members about your condition can provide you with one way to get support and guidance. Reach out to trusted friends or family members, explain your symptoms to them, and let them know that you are working with a health care provider to treat your depression. Financial resources Not all insurance plans cover mental health care, so it is important to check with your insurance carrier. If paying for co-pays or counseling services is a problem, search for a local or county mental health care center. They may be able to offer public mental health care services at low or no cost when you are not able to see a private health care provider. If you are taking medicine for depression, you may be able to get the generic form, which may be less expensive. Some makers of prescription medicines also offer help to patients who cannot afford the medicines they need. Follow these instructions at home:  Get the right amount and quality of sleep.  Cut down on using caffeine, tobacco, alcohol, and other potentially harmful substances.  Try to exercise, such as walking or lifting small weights.  Take over-the-counter and prescription medicines only as told by your health care provider.  Eat a healthy diet that includes plenty  of vegetables, fruits, whole grains, low-fat dairy products, and lean protein. Do not eat a lot of foods that are high in solid fats, added sugars, or salt.  Keep all follow-up visits as told by your health care provider. This is important. Contact a health care provider if:  You stop taking your antidepressant medicines, and you have any of these symptoms: ? Nausea. ? Headache. ? Feeling lightheaded. ? Chills and body aches. ? Not being able to sleep (insomnia).  You or your friends and family think your depression is getting worse. Get help right away if:  You have thoughts of hurting  yourself or others. If you ever feel like you may hurt yourself or others, or have thoughts about taking your own life, get help right away. You can go to your nearest emergency department or call:  Your local emergency services (911 in the [place]  A suicide crisis helpline, such as the [place] at 337-053-0976. This is open 24-hours a day.  Summary  If you are living with depression, there are ways to help you recover from it and also ways to prevent it from coming back.  Work with your health care team to create a management plan that includes counseling, stress management techniques, and healthy lifestyle habits. This information is not intended to replace advice given to you by your health care provider. Make sure you discuss any questions you have with your health care provider. Document Released: 01/08/2016 Document Revised: 01/08/2016 Document Reviewed: 01/08/2016 Elsevier Interactive Patient Education  2018 [employer].  Fluoxetine capsules or tablets (Depression/Mood Disorders) What is this medicine? FLUOXETINE (floo OX e teen) belongs to a class of drugs known as selective serotonin reuptake inhibitors (SSRIs). It helps to treat mood problems such as depression, obsessive compulsive disorder, and panic attacks. It can also treat certain eating disorders. This  medicine may be used for other purposes; ask your health care provider or pharmacist if you have questions. COMMON BRAND NAME(S): Prozac What should I tell my health care provider before I take this medicine? They need to know if you have any of these conditions: -bipolar disorder or a family history of bipolar disorder -bleeding disorders -glaucoma -heart disease -liver disease -low levels of sodium in the blood -seizures -suicidal thoughts, plans, or attempt; a previous suicide attempt by you or a family member -take MAOIs like Carbex, Eldepryl, Marplan, Nardil, and Parnate -take medicines that treat or prevent blood clots -thyroid disease -an unusual or allergic reaction to fluoxetine, other medicines, foods, dyes, or preservatives -pregnant or trying to get pregnant -breast-feeding How should I use this medicine? Take this medicine by mouth with a glass of water. Follow the directions on the prescription label. You can take this medicine with or without food. Take your medicine at regular intervals. Do not take it more often than directed. Do not stop taking this medicine suddenly except upon the advice of your doctor. Stopping this medicine too quickly may cause serious side effects or your condition may worsen. A special MedGuide will be given to you by the pharmacist with each prescription and refill. Be sure to read this information carefully each time. Talk to your pediatrician regarding the use of this medicine in children. While this drug may be prescribed for children as young as 7 years for selected conditions, precautions do apply. Overdosage: If you think you have taken too much of this medicine contact a poison control center or emergency room at once. NOTE: This medicine is only for you. Do not share this medicine with others. What if I miss a dose? If you miss a dose, skip the missed dose and go back to your regular dosing schedule. Do not take double or extra doses. What  may interact with this medicine? Do not take this medicine with any of the following medications: -other medicines containing fluoxetine, like Sarafem or Symbyax -cisapride -linezolid -MAOIs like Carbex, Eldepryl, Marplan, Nardil, and Parnate -methylene blue (injected into a vein) -pimozide -thioridazine This medicine may also interact with the following medications: -alcohol -amphetamines -aspirin and aspirin-like medicines -carbamazepine -certain medicines for depression, anxiety, or  psychotic disturbances -certain medicines for migraine headaches like almotriptan, eletriptan, frovatriptan, naratriptan, rizatriptan, sumatriptan, zolmitriptan -digoxin -diuretics -fentanyl -flecainide -furazolidone -isoniazid -lithium -medicines for sleep -medicines that treat or prevent blood clots like warfarin, enoxaparin, and dalteparin -NSAIDs, medicines for pain and inflammation, like ibuprofen or naproxen -phenytoin -procarbazine -propafenone -rasagiline -ritonavir -supplements like St. John's wort, kava kava, valerian -tramadol -tryptophan -vinblastine This list may not describe all possible interactions. Give your health care provider a list of all the medicines, herbs, non-prescription drugs, or dietary supplements you use. Also tell them if you smoke, drink alcohol, or use illegal drugs. Some items may interact with your medicine. What should I watch for while using this medicine? Tell your doctor if your symptoms do not get better or if they get worse. Visit your doctor or health care professional for regular checks on your progress. Because it may take several weeks to see the full effects of this medicine, it is important to continue your treatment as prescribed by your doctor. Patients and their families should watch out for new or worsening thoughts of suicide or depression. Also watch out for sudden changes in feelings such as feeling anxious, agitated, panicky, irritable,  hostile, aggressive, impulsive, severely restless, overly excited and hyperactive, or not being able to sleep. If this happens, especially at the beginning of treatment or after a change in dose, call your health care professional. Dennis Bast may get drowsy or dizzy. Do not drive, use machinery, or do anything that needs mental alertness until you know how this medicine affects you. Do not stand or sit up quickly, especially if you are an older patient. This reduces the risk of dizzy or fainting spells. Alcohol may interfere with the effect of this medicine. Avoid alcoholic drinks. Your mouth may get dry. Chewing sugarless gum or sucking hard candy, and drinking plenty of water may help. Contact your doctor if the problem does not go away or is severe. This medicine may affect blood sugar levels. If you have diabetes, check with your doctor or health care professional before you change your diet or the dose of your diabetic medicine. What side effects may I notice from receiving this medicine? Side effects that you should report to your doctor or health care professional as soon as possible: -allergic reactions like skin rash, itching or hives, swelling of the face, lips, or tongue -anxious -black, tarry stools -breathing problems -changes in vision -confusion -elevated mood, decreased need for sleep, racing thoughts, impulsive behavior -eye pain -fast, irregular heartbeat -feeling faint or lightheaded, falls -feeling agitated, angry, or irritable -hallucination, loss of contact with reality -loss of balance or coordination -loss of memory -painful or prolonged erections -restlessness, pacing, inability to keep still -seizures -stiff muscles -suicidal thoughts or other mood changes -trouble sleeping -unusual bleeding or bruising -unusually weak or tired -vomiting Side effects that usually do not require medical attention (report to your doctor or health care professional if they continue or are  bothersome): -change in appetite or weight -change in sex drive or performance -diarrhea -dry mouth -headache -increased sweating -nausea -tremors This list may not describe all possible side effects. Call your doctor for medical advice about side effects. You may report side effects to FDA at 1-800-FDA-1088. Where should I keep my medicine? Keep out of the reach of children. Store at room temperature between 15 and 30 degrees C (59 and 86 degrees F). Throw away any unused medicine after the expiration date. NOTE: This sheet is a summary. It may not  cover all possible information. If you have questions about this medicine, talk to your doctor, pharmacist, or health care provider.  2018 Elsevier/[employer] Standard (2015-07-08 15:55:27)

## 2016-08-09 NOTE — Progress Notes (Signed)
Subjective:  Patient ID: Brandy [name], female    DOB: 1970-05-23  Age: 46 y.o. MRN: [id number]  CC: Follow-up   HPI Brandy [name] presents for  anxiety disorder.  She has the following symptoms: difficulty concentrating, feelings of losing control, palpitations, racing thoughts, loose stools. Onset of symptoms was approximately several years ago, gradually worsening since that time. She denies current suicidal and homicidal ideation. Possible organic causes contributing are: none. Risk factors: none. She reports adherence with Prozac. Reports minimal relief of symptoms. She complains of the following side effects from the treatment: none. She is declines referral to counseling at thirst time. She does agree to speakig with LCSW.   Outpatient Medications Prior to Visit  Medication Sig Dispense Refill  . amLODipine (NORVASC) 2.5 MG tablet Take 1 tablet (2.5 mg total) by mouth daily. 90 tablet 0  . hydrochlorothiazide (HYDRODIURIL) 25 MG tablet Take 1 tablet (25 mg total) by mouth daily. 90 tablet 0  . Lactobacillus-Inulin ([place]) CAPS Take 2 capsules by mouth 2 (two) times daily.    . meloxicam (MOBIC) 15 MG tablet Take 1 tablet (15 mg total) by mouth daily. X 10 days then prn pain (Patient not taking: Reported on 06/26/2016) 30 tablet 1  . methocarbamol (ROBAXIN) 500 MG tablet Take 1 tablet (500 mg total) by mouth 3 (three) times daily. X 10 days then prn muscle spasm (Patient not taking: Reported on 06/26/2016) 90 tablet 0  . Vitamin D, Ergocalciferol, (DRISDOL) 50000 units CAPS capsule Take 1 capsule (50,000 Units total) by mouth every 7 (seven) days. (Patient not taking: Reported on 08/09/2016) 16 capsule 0  . FLUoxetine (PROZAC) 10 MG capsule Take 1 capsule (10 mg total) by mouth daily. 60 capsule 0   No facility-administered medications prior to visit.     ROS Review of Systems  Constitutional: Negative.   Respiratory: Negative.   Cardiovascular: Negative.     Gastrointestinal: Negative.   Psychiatric/Behavioral: Negative for suicidal ideas. The patient is nervous/anxious.    Objective:  BP 124/84   Pulse 75   Temp 98.2 F (36.8 C) (Oral)   Resp 16   Wt 160 lb 6.4 oz (72.8 kg)   SpO2 98%   BMI 25.12 kg/m   BP/Weight 08/09/2016 03/27/621 08/23/2829  Systolic BP 517 616 073  Diastolic BP 84 82 90  Wt. (Lbs) 160.4 161.6 170  BMI 25.12 25.31 26.63    Physical Exam  Constitutional: She appears well-developed and well-nourished.  Neck: No JVD present.  Cardiovascular: Normal rate, regular rhythm, normal heart sounds and intact distal pulses.   Pulmonary/Chest: Effort normal and breath sounds normal.  Abdominal: Soft. Bowel sounds are normal.  Skin: Skin is warm and dry.  Psychiatric: Her mood appears anxious. She is slowed. She expresses no homicidal and no suicidal ideation. She expresses no suicidal plans and no homicidal plans.  Nursing note and vitals reviewed.  Assessment & Plan:   Problem List Items Addressed This Visit    None    Visit Diagnoses    Anxiety with depression    -  Primary   Pt.reports not having time to speak with LCSW today. Contact information for LCSW provided   Increased dosage of Prozac.   Relevant Medications   FLUoxetine (PROZAC) 20 MG capsule   Essential hypertension       Relevant Orders   Lipid Panel      Meds ordered this encounter  Medications  . FLUoxetine (PROZAC) 20 MG capsule  Sig: Take 1 capsule (20 mg total) by mouth daily.    Dispense:  60 capsule    Refill:  0    Order Specific Question:   Supervising Provider    Answer:   Tresa Garter [[id number]]    Follow-up: Return in about 2 months (around 10/09/2016) for Anxiety / Depression .   Alfonse Spruce FNP

## 2016-08-13 ENCOUNTER — Other Ambulatory Visit: Payer: 59

## 2016-08-25 ENCOUNTER — Other Ambulatory Visit: Payer: Self-pay | Admitting: Family Medicine

## 2016-08-25 DIAGNOSIS — F418 Other specified anxiety disorders: Secondary | ICD-10-CM

## 2016-09-04 ENCOUNTER — Other Ambulatory Visit: Payer: Self-pay | Admitting: Pharmacist

## 2016-09-04 ENCOUNTER — Ambulatory Visit: Payer: 59 | Admitting: Family Medicine

## 2016-09-04 DIAGNOSIS — F418 Other specified anxiety disorders: Secondary | ICD-10-CM

## 2016-09-04 MED ORDER — FLUOXETINE HCL 20 MG PO CAPS: 20.0000 mg | ORAL_CAPSULE | Freq: Every day | ORAL | 0 refills | Status: DC

## 2016-09-13 ENCOUNTER — Other Ambulatory Visit: Payer: Self-pay | Admitting: Physician Assistant

## 2016-09-13 DIAGNOSIS — I10 Essential (primary) hypertension: Secondary | ICD-10-CM

## 2016-09-24 ENCOUNTER — Other Ambulatory Visit: Payer: Self-pay | Admitting: Family Medicine

## 2016-09-24 DIAGNOSIS — I10 Essential (primary) hypertension: Secondary | ICD-10-CM

## 2016-10-01 ENCOUNTER — Other Ambulatory Visit: Payer: Self-pay | Admitting: Pharmacist

## 2016-10-01 DIAGNOSIS — F418 Other specified anxiety disorders: Secondary | ICD-10-CM

## 2016-10-01 MED ORDER — FLUOXETINE HCL 20 MG PO CAPS: 20.0000 mg | ORAL_CAPSULE | Freq: Every day | ORAL | 0 refills | Status: DC

## 2016-10-09 ENCOUNTER — Other Ambulatory Visit: Payer: Self-pay | Admitting: Pharmacist

## 2016-10-09 ENCOUNTER — Ambulatory Visit: Payer: 59 | Attending: Family Medicine | Admitting: Family Medicine

## 2016-10-09 ENCOUNTER — Encounter: Payer: Self-pay | Admitting: Family Medicine

## 2016-10-09 VITALS — BP 119/85 | HR 68 | Temp 98.3°F | Resp 18 | Ht 67.0 in | Wt 153.0 lb

## 2016-10-09 DIAGNOSIS — F418 Other specified anxiety disorders: Secondary | ICD-10-CM | POA: Diagnosis not present

## 2016-10-09 DIAGNOSIS — Z79899 Other long term (current) drug therapy: Secondary | ICD-10-CM | POA: Diagnosis not present

## 2016-10-09 DIAGNOSIS — G4700 Insomnia, unspecified: Secondary | ICD-10-CM | POA: Diagnosis not present

## 2016-10-09 DIAGNOSIS — R002 Palpitations: Secondary | ICD-10-CM | POA: Diagnosis not present

## 2016-10-09 MED ORDER — FLUOXETINE HCL 40 MG PO CAPS: 40.0000 mg | ORAL_CAPSULE | Freq: Every day | ORAL | 0 refills | Status: DC

## 2016-10-09 MED ORDER — FLUOXETINE HCL 40 MG PO CAPS: 40.0000 mg | ORAL_CAPSULE | Freq: Every day | ORAL | 2 refills | Status: DC

## 2016-10-09 MED ORDER — TRAZODONE HCL 50 MG PO TABS: 25.0000 mg | ORAL_TABLET | Freq: Every evening | ORAL | 0 refills | Status: DC | PRN

## 2016-10-09 NOTE — Progress Notes (Signed)
Subjective:  Patient ID: Brandy [name], female    DOB: [birth year]  Age: 46 y.o. MRN: [id number]  CC: Follow-up   HPI Brandy [name] presents for anxiety/depression follow up. She has the following symptoms: difficulty concentrating, feelings of losing control, palpitations, racing thoughts, loose stools. Onset of symptoms was approximately several years ago. She denies current suicidal and homicidal ideation. Possible organic causes contributing are: none. Risk factors: none. She reports adherence with Prozac. Reports moderate relief of symptoms. She complains of the following side effects from the treatment: none. She is receiving therapy and life coach counseling twice a week at this time. She does report significant approval of symptoms. She does report insomnia 4 to 5 hours per night of sleep. She denies any snoring or apneic episodes. She declines speaking with LCSW today.  Outpatient Medications Prior to Visit  Medication Sig Dispense Refill  . amLODipine (NORVASC) 2.5 MG tablet TAKE 1 TABLET BY MOUTH EVERY DAY 90 tablet 0  . hydrochlorothiazide (HYDRODIURIL) 25 MG tablet TAKE 1 TABLET (25 MG TOTAL) BY MOUTH DAILY. 90 tablet 0  . Lactobacillus-Inulin ([place]) CAPS Take 2 capsules by mouth 2 (two) times daily.    . meloxicam (MOBIC) 15 MG tablet Take 1 tablet (15 mg total) by mouth daily. X 10 days then prn pain (Patient not taking: Reported on 06/26/2016) 30 tablet 1  . methocarbamol (ROBAXIN) 500 MG tablet Take 1 tablet (500 mg total) by mouth 3 (three) times daily. X 10 days then prn muscle spasm (Patient not taking: Reported on 06/26/2016) 90 tablet 0  . Vitamin D, Ergocalciferol, (DRISDOL) 50000 units CAPS capsule Take 1 capsule (50,000 Units total) by mouth every 7 (seven) days. (Patient not taking: Reported on 08/09/2016) 16 capsule 0  . FLUoxetine (PROZAC) 20 MG capsule Take 1 capsule (20 mg total) by mouth daily. 30 capsule 0   No facility-administered  medications prior to visit.     ROS Review of Systems  Constitutional: Negative.   Respiratory: Negative.   Cardiovascular: Negative.   Psychiatric/Behavioral: Positive for dysphoric mood. Negative for suicidal ideas. The patient is nervous/anxious.     Objective:  BP 119/85 (BP Location: Left Arm, Patient Position: Sitting, Cuff Size: Normal)   Pulse 68   Temp 98.3 F (36.8 C) (Oral)   Resp 18   Ht 5\' 7"  (1.702 m)   Wt 153 lb (69.4 kg)   SpO2 97%   BMI 23.96 kg/m   BP/Weight 10/09/2016 3/50/0938 02/26/2991  Systolic BP 716 967 893  Diastolic BP 85 84 82  Wt. (Lbs) 153 160.4 161.6  BMI 23.96 25.12 25.31     Physical Exam  Constitutional: She appears well-developed and well-nourished.  Neck: No JVD present.  Cardiovascular: Normal rate, regular rhythm, normal heart sounds and intact distal pulses.   Pulmonary/Chest: Effort normal and breath sounds normal.  Abdominal: Soft. Bowel sounds are normal.  Skin: Skin is warm and dry.  Psychiatric: She has a normal mood and affect. She expresses no homicidal and no suicidal ideation. She expresses no suicidal plans and no homicidal plans.  Nursing note and vitals reviewed.  Assessment & Plan:   Problem List Items Addressed This Visit    None    Visit Diagnoses    Anxiety with depression    -  Primary   Relevant Medications            FLUoxetine (PROZAC) 40 MG capsule   Insomnia, unspecified type  Relevant Medications   traZODone (DESYREL) 50 MG tablet        Follow-up: Return in about 8 weeks (around 12/04/2016), or if symptoms worsen or fail to improve, for Anxiety/Depression.   Alfonse Spruce FNP

## 2016-10-09 NOTE — Progress Notes (Signed)
Patient is here for f/up  

## 2016-10-09 NOTE — Patient Instructions (Signed)

## 2016-11-11 ENCOUNTER — Other Ambulatory Visit: Payer: Self-pay | Admitting: Family Medicine

## 2016-11-11 DIAGNOSIS — G4700 Insomnia, unspecified: Secondary | ICD-10-CM

## 2016-11-22 ENCOUNTER — Other Ambulatory Visit: Payer: Self-pay | Admitting: Physician Assistant

## 2016-11-22 DIAGNOSIS — E559 Vitamin D deficiency, unspecified: Secondary | ICD-10-CM

## 2016-12-05 ENCOUNTER — Ambulatory Visit: Payer: 59 | Admitting: Family Medicine

## 2016-12-21 ENCOUNTER — Other Ambulatory Visit: Payer: Self-pay | Admitting: Family Medicine

## 2016-12-21 DIAGNOSIS — I10 Essential (primary) hypertension: Secondary | ICD-10-CM

## 2017-03-24 ENCOUNTER — Ambulatory Visit: Payer: Self-pay | Attending: Family Medicine | Admitting: Family Medicine

## 2017-03-24 ENCOUNTER — Encounter: Payer: Self-pay | Admitting: Family Medicine

## 2017-03-24 VITALS — BP 154/105 | HR 93 | Temp 98.2°F | Resp 16 | Ht 67.0 in | Wt 170.2 lb

## 2017-03-24 DIAGNOSIS — I10 Essential (primary) hypertension: Secondary | ICD-10-CM | POA: Insufficient documentation

## 2017-03-24 DIAGNOSIS — Z760 Encounter for issue of repeat prescription: Secondary | ICD-10-CM | POA: Insufficient documentation

## 2017-03-24 DIAGNOSIS — F418 Other specified anxiety disorders: Secondary | ICD-10-CM | POA: Insufficient documentation

## 2017-03-24 DIAGNOSIS — Z79899 Other long term (current) drug therapy: Secondary | ICD-10-CM | POA: Insufficient documentation

## 2017-03-24 MED ORDER — FLUOXETINE HCL 40 MG PO CAPS: 40.0000 mg | ORAL_CAPSULE | Freq: Every day | ORAL | 5 refills | Status: DC

## 2017-03-24 MED ORDER — HYDROCHLOROTHIAZIDE 25 MG PO TABS: 25.0000 mg | ORAL_TABLET | Freq: Every day | ORAL | 5 refills | Status: DC

## 2017-03-24 MED ORDER — AMLODIPINE BESYLATE 2.5 MG PO TABS: 2.5000 mg | ORAL_TABLET | Freq: Every day | ORAL | 5 refills | Status: DC

## 2017-03-24 MED FILL — FLUoxetine HCL 40 MG CAPS: 40 | 30 days supply | Qty: 30 | Fill #0

## 2017-03-24 MED FILL — HYDROCHLOROTHIAZIDE 25 MG T: 25 | 30 days supply | Qty: 30 | Fill #0

## 2017-03-24 NOTE — Patient Instructions (Signed)
Managing Your Hypertension Hypertension is commonly called high blood pressure. This is when the force of your blood pressing against the walls of your arteries is too strong. Arteries are blood vessels that carry blood from your heart throughout your body. Hypertension forces the heart to work harder to pump blood, and may cause the arteries to become narrow or stiff. Having untreated or uncontrolled hypertension can cause heart attack, stroke, kidney disease, and other problems. What are blood pressure readings? A blood pressure reading consists of a higher number over a lower number. Ideally, your blood pressure should be below 120/80. The first ("top") number is called the systolic pressure. It is a measure of the pressure in your arteries as your heart beats. The second ("bottom") number is called the diastolic pressure. It is a measure of the pressure in your arteries as the heart relaxes. What does my blood pressure reading mean? Blood pressure is classified into four stages. Based on your blood pressure reading, your health care provider may use the following stages to determine what type of treatment you need, if any. Systolic pressure and diastolic pressure are measured in a unit called mm Hg. Normal  Systolic pressure: below 120.  Diastolic pressure: below 80. Elevated  Systolic pressure: 120-129.  Diastolic pressure: below 80. Hypertension stage 1  Systolic pressure: 130-139.  Diastolic pressure: 80-89. Hypertension stage 2  Systolic pressure: 140 or above.  Diastolic pressure: 90 or above. What health risks are associated with hypertension? Managing your hypertension is an important responsibility. Uncontrolled hypertension can lead to:  A heart attack.  A stroke.  A weakened blood vessel (aneurysm).  Heart failure.  Kidney damage.  Eye damage.  Metabolic syndrome.  Memory and concentration problems.  What changes can I make to manage my  hypertension? Hypertension can be managed by making lifestyle changes and possibly by taking medicines. Your health care provider will help you make a plan to bring your blood pressure within a normal range. Eating and drinking  Eat a diet that is high in fiber and potassium, and low in salt (sodium), added sugar, and fat. An example eating plan is called the DASH (Dietary Approaches to Stop Hypertension) diet. To eat this way: ? Eat plenty of fresh fruits and vegetables. Try to fill half of your plate at each meal with fruits and vegetables. ? Eat whole grains, such as whole wheat pasta, brown rice, or whole grain bread. Fill about one quarter of your plate with whole grains. ? Eat low-fat diary products. ? Avoid fatty cuts of meat, processed or cured meats, and poultry with skin. Fill about one quarter of your plate with lean proteins such as fish, chicken without skin, beans, eggs, and tofu. ? Avoid premade and processed foods. These tend to be higher in sodium, added sugar, and fat.  Reduce your daily sodium intake. Most people with hypertension should eat less than 1,500 mg of sodium a day.  Limit alcohol intake to no more than 1 drink a day for nonpregnant women and 2 drinks a day for men. One drink equals 12 oz of beer, 5 oz of wine, or 1 oz of hard liquor. Lifestyle  Work with your health care provider to maintain a healthy body weight, or to lose weight. Ask what an ideal weight is for you.  Get at least 30 minutes of exercise that causes your heart to beat faster (aerobic exercise) most days of the week. Activities may include walking, swimming, or biking.  Include exercise   to strengthen your muscles (resistance exercise), such as weight lifting, as part of your weekly exercise routine. Try to do these types of exercises for 30 minutes at least 3 days a week.  Do not use any products that contain nicotine or tobacco, such as cigarettes and e-cigarettes. If you need help quitting, ask  your health care provider.  Control any long-term (chronic) conditions you have, such as high cholesterol or diabetes. Monitoring  Monitor your blood pressure at home as told by your health care provider. Your personal target blood pressure may vary depending on your medical conditions, your age, and other factors.  Have your blood pressure checked regularly, as often as told by your health care provider. Working with your health care provider  Review all the medicines you take with your health care provider because there may be side effects or interactions.  Talk with your health care provider about your diet, exercise habits, and other lifestyle factors that may be contributing to hypertension.  Visit your health care provider regularly. Your health care provider can help you create and adjust your plan for managing hypertension. Will I need medicine to control my blood pressure? Your health care provider may prescribe medicine if lifestyle changes are not enough to get your blood pressure under control, and if:  Your systolic blood pressure is 130 or higher.  Your diastolic blood pressure is 80 or higher.  Take medicines only as told by your health care provider. Follow the directions carefully. Blood pressure medicines must be taken as prescribed. The medicine does not work as well when you skip doses. Skipping doses also puts you at risk for problems. Contact a health care provider if:  You think you are having a reaction to medicines you have taken.  You have repeated (recurrent) headaches.  You feel dizzy.  You have swelling in your ankles.  You have trouble with your vision. Get help right away if:  You develop a severe headache or confusion.  You have unusual weakness or numbness, or you feel faint.  You have severe pain in your chest or abdomen.  You vomit repeatedly.  You have trouble breathing. Summary  Hypertension is when the force of blood pumping through  your arteries is too strong. If this condition is not controlled, it may put you at risk for serious complications.  Your personal target blood pressure may vary depending on your medical conditions, your age, and other factors. For most people, a normal blood pressure is less than 120/80.  Hypertension is managed by lifestyle changes, medicines, or both. Lifestyle changes include weight loss, eating a healthy, low-sodium diet, exercising more, and limiting alcohol. This information is not intended to replace advice given to you by your health care provider. Make sure you discuss any questions you have with your health care provider. Document Released: 10/30/2011 Document Revised: 01/03/2016 Document Reviewed: 01/03/2016 Elsevier Interactive Patient Education  2018 [employer] Inc[employer]  

## 2017-03-24 NOTE — Progress Notes (Signed)
Patient is here for medication refills.    Patient stated she have not taken any medications for weeks.

## 2017-03-24 NOTE — Progress Notes (Signed)
Subjective:  Patient ID: Brandy [name], female    DOB: Jul 05, 1970  Age: 47 y.o. MRN: [id number]  CC: Medication Refill   HPI Brandy [name] presents for hypertension and anxiety/depression follow up. She has the following symptoms: difficulty concentrating, feelings of losing control, palpitations, racing thoughts, loose stools. Onset of symptoms was approximately several years ago. She denies current suicidal and homicidal ideation. Possible organic causes contributing are: none. Risk factors: none. She reports adherence with Prozac. Reports adequate relief of symptoms. She complains of the following side effects from the treatment: none. She is not currently receiving therapy.  She declines speaking with LCSW today. She is agreeable to receiving information on counseling resources.   Hypertension  Disease Monitoring  Blood pressure range: She does not check BP at home.               Chest pain: no   Dyspnea: no   Claudication: no   Medication compliance: Yes, but she reports being without her medications for a few weeks.   Medication Side Effects  Lightheadedness: no   Urinary frequency: no   Edema: no   Preventitive Healthcare:  Exercise: no   Diet Pattern: low sodium  Salt Restriction: no     Outpatient Medications Prior to Visit  Medication Sig Dispense Refill  . Lactobacillus-Inulin ([place]) CAPS Take 2 capsules by mouth 2 (two) times daily. (Patient not taking: Reported on 03/24/2017)    . meloxicam (MOBIC) 15 MG tablet Take 1 tablet (15 mg total) by mouth daily. X 10 days then prn pain (Patient not taking: Reported on 06/26/2016) 30 tablet 1  . methocarbamol (ROBAXIN) 500 MG tablet Take 1 tablet (500 mg total) by mouth 3 (three) times daily. X 10 days then prn muscle spasm (Patient not taking: Reported on 06/26/2016) 90 tablet 0  . traZODone (DESYREL) 50 MG tablet TAKE 0.5-1 TABLETS (25-50 MG TOTAL) BY MOUTH AT BEDTIME AS NEEDED FOR SLEEP. (Patient  not taking: Reported on 03/24/2017) 30 tablet 0  . amLODipine (NORVASC) 2.5 MG tablet TAKE 1 TABLET BY MOUTH EVERY DAY (Patient not taking: Reported on 03/24/2017) 90 tablet 0  . FLUoxetine (PROZAC) 40 MG capsule Take 1 capsule (40 mg total) by mouth daily. (Patient not taking: Reported on 03/24/2017) 90 capsule 0  . hydrochlorothiazide (HYDRODIURIL) 25 MG tablet TAKE 1 TABLET BY MOUTH EVERY DAY (Patient not taking: Reported on 03/24/2017) 90 tablet 0  . Vitamin D, Ergocalciferol, (DRISDOL) 50000 units CAPS capsule Take 1 capsule (50,000 Units total) by mouth every 7 (seven) days. (Patient not taking: Reported on 08/09/2016) 16 capsule 0   No facility-administered medications prior to visit.     ROS Review of Systems  Constitutional: Negative.   Eyes: Negative.   Respiratory: Negative.   Cardiovascular: Negative.   Gastrointestinal: Negative.   Skin: Negative.   Psychiatric/Behavioral: Positive for dysphoric mood. Negative for suicidal ideas. The patient is nervous/anxious.         Objective:  BP (!) 154/105 (BP Location: Left Arm, Cuff Size: Normal)   Pulse 93   Temp 98.2 F (36.8 C) (Oral)   Resp 16   Ht 5\' 7"  (1.702 m)   Wt 170 lb 3.2 oz (77.2 kg)   SpO2 95%   BMI 26.66 kg/m   BP/Weight 03/24/2017 10/09/2016 2/77/4128  Systolic BP 786 767 209  Diastolic BP 470 85 84  Wt. (Lbs) 170.2 153 160.4  BMI 26.66 23.96 25.12     Physical Exam  Eyes: Conjunctivae are normal. Pupils are equal, round, and reactive to light.  Neck: No JVD present.  Cardiovascular: Normal rate, regular rhythm, normal heart sounds and intact distal pulses.  Pulmonary/Chest: Effort normal and breath sounds normal.  Abdominal: Soft. Bowel sounds are normal.  Skin: Skin is warm and dry.  Psychiatric: She exhibits a depressed mood. She expresses no homicidal and no suicidal ideation. She expresses no suicidal plans and no homicidal plans. She is communicative. She is attentive.  Nursing note and vitals  reviewed.    Assessment & Plan:   1. Essential hypertension   - amLODipine (NORVASC) 2.5 MG tablet; Take 1 tablet (2.5 mg total) by mouth daily.  Dispense: 30 tablet; Refill: 5 - hydrochlorothiazide (HYDRODIURIL) 25 MG tablet; Take 1 tablet (25 mg total) by mouth daily.  Dispense: 30 tablet; Refill: 5  2. Anxiety with depression Counseling information resources given and LCSW contact information. - FLUoxetine (PROZAC) 40 MG capsule; Take 1 capsule (40 mg total) by mouth daily.  Dispense: 30 capsule; Refill: 5       Follow-up: Return in about 3 months (around 06/21/2017) for HTN / Anxiety follow up.   Alfonse Spruce FNP

## 2017-04-23 MED FILL — AMLODIPINE BESYLATE 2.5 MG: 2.5 | 30 days supply | Qty: 30 | Fill #0

## 2017-04-23 MED FILL — HYDROCHLOROTHIAZIDE 25 MG T: 25 | 30 days supply | Qty: 30 | Fill #1

## 2017-04-23 MED FILL — FLUoxetine HCL 40 MG CAPS: 40 | 30 days supply | Qty: 30 | Fill #1

## 2017-06-09 MED FILL — HYDROCHLOROTHIAZIDE 25 MG T: 25 | 30 days supply | Qty: 30 | Fill #2

## 2017-06-09 MED FILL — FLUoxetine HCL 40 MG CAPS: 40 | 30 days supply | Qty: 30 | Fill #2

## 2017-06-23 ENCOUNTER — Ambulatory Visit: Payer: Self-pay | Attending: Family Medicine | Admitting: Family Medicine

## 2017-06-23 ENCOUNTER — Encounter: Payer: Self-pay | Admitting: Family Medicine

## 2017-06-23 DIAGNOSIS — F329 Major depressive disorder, single episode, unspecified: Secondary | ICD-10-CM | POA: Insufficient documentation

## 2017-06-23 DIAGNOSIS — Z79899 Other long term (current) drug therapy: Secondary | ICD-10-CM | POA: Insufficient documentation

## 2017-06-23 DIAGNOSIS — F418 Other specified anxiety disorders: Secondary | ICD-10-CM | POA: Insufficient documentation

## 2017-06-23 DIAGNOSIS — I10 Essential (primary) hypertension: Secondary | ICD-10-CM | POA: Insufficient documentation

## 2017-06-23 MED ORDER — FLUOXETINE HCL 40 MG PO CAPS: 40.0000 mg | ORAL_CAPSULE | Freq: Every day | ORAL | 5 refills | Status: DC

## 2017-06-23 MED ORDER — BUSPIRONE HCL 15 MG PO TABS: ORAL_TABLET | ORAL | 3 refills | Status: DC

## 2017-06-23 MED ORDER — AMLODIPINE BESYLATE 2.5 MG PO TABS: 2.5000 mg | ORAL_TABLET | Freq: Every day | ORAL | 5 refills | Status: DC

## 2017-06-23 MED ORDER — BUSPIRONE HCL 7.5 MG PO TABS: 7.5000 mg | ORAL_TABLET | Freq: Three times a day (TID) | ORAL | 3 refills | Status: DC

## 2017-06-23 MED ORDER — HYDROCHLOROTHIAZIDE 25 MG PO TABS: 25.0000 mg | ORAL_TABLET | Freq: Every day | ORAL | 5 refills | Status: DC

## 2017-06-23 MED FILL — busPIRone HCL 15 MG TABS: 15 | 30 days supply | Qty: 45 | Fill #0

## 2017-06-23 MED FILL — AMLODIPINE 2.5 MG TABLET: 2.5 | 30 days supply | Qty: 30 | Fill #0

## 2017-06-23 NOTE — Progress Notes (Signed)
Subjective:  Patient ID: Brandy [name], female    DOB: Apr 20, 1970  Age: 47 y.o. MRN: [id number]  CC: Hypertension and Establish Care   HPI Brandy [name] is a 47 year old female with a history of hypertension, anxiety and depression who was previously followed by Fredia Beets, FNP who presents to establish care with me. She is doing well on her antihypertensive and denies adverse effects.  She takes Prozac for anxiety and depression and has completed a session of therapy with her counselor on a life coach but complains she is antsy intermittently and worries a lot. She denies worsening of her depression, suicidal ideation or intents. Takes trazodone occasionally to help with sleep  Past Medical History:  Diagnosis Date  . Arthritis   . Disc degeneration, lumbar 2011    Past Surgical History:  Procedure Laterality Date  . TUBAL LIGATION      No Known Allergies   Outpatient Medications Prior to Visit  Medication Sig Dispense Refill  . Lactobacillus-Inulin ([place]) CAPS Take 2 capsules by mouth 2 (two) times daily. (Patient not taking: Reported on 03/24/2017)    . meloxicam (MOBIC) 15 MG tablet Take 1 tablet (15 mg total) by mouth daily. X 10 days then prn pain (Patient not taking: Reported on 06/26/2016) 30 tablet 1  . methocarbamol (ROBAXIN) 500 MG tablet Take 1 tablet (500 mg total) by mouth 3 (three) times daily. X 10 days then prn muscle spasm (Patient not taking: Reported on 06/26/2016) 90 tablet 0  . traZODone (DESYREL) 50 MG tablet TAKE 0.5-1 TABLETS (25-50 MG TOTAL) BY MOUTH AT BEDTIME AS NEEDED FOR SLEEP. (Patient not taking: Reported on 03/24/2017) 30 tablet 0  . amLODipine (NORVASC) 2.5 MG tablet Take 1 tablet (2.5 mg total) by mouth daily. 30 tablet 5  . FLUoxetine (PROZAC) 40 MG capsule Take 1 capsule (40 mg total) by mouth daily. 30 capsule 5  . hydrochlorothiazide (HYDRODIURIL) 25 MG tablet Take 1 tablet (25 mg total) by mouth daily. 30 tablet  5   No facility-administered medications prior to visit.     ROS Review of Systems  Constitutional: Negative for activity change, appetite change and fatigue.  HENT: Negative for congestion, sinus pressure and sore throat.   Eyes: Negative for visual disturbance.  Respiratory: Negative for cough, chest tightness, shortness of breath and wheezing.   Cardiovascular: Negative for chest pain and palpitations.  Gastrointestinal: Negative for abdominal distention, abdominal pain and constipation.  Endocrine: Negative for polydipsia.  Genitourinary: Negative for dysuria and frequency.  Musculoskeletal: Negative for arthralgias and back pain.  Skin: Negative for rash.  Neurological: Negative for tremors, light-headedness and numbness.  Hematological: Does not bruise/bleed easily.  Psychiatric/Behavioral: Negative for agitation and behavioral problems.    Objective:  BP 130/85   Pulse 75   Temp 97.8 F (36.6 C) (Oral)   Ht 5' 7" (1.702 m)   Wt 171 lb 12.8 oz (77.9 kg)   SpO2 99%   BMI 26.91 kg/m   BP/Weight 06/23/2017 03/24/2017 9/52/8413  Systolic BP 244 010 272  Diastolic BP 85 536 85  Wt. (Lbs) 171.8 170.2 153  BMI 26.91 26.66 23.96      Physical Exam  Constitutional: She is oriented to person, place, and time. She appears well-developed and well-nourished.  Cardiovascular: Normal rate, normal heart sounds and intact distal pulses.  No murmur heard. Pulmonary/Chest: Effort normal and breath sounds normal. She has no wheezes. She has no rales. She exhibits no tenderness.  Abdominal:  Soft. Bowel sounds are normal. She exhibits no distension and no mass. There is no tenderness.  Musculoskeletal: Normal range of motion.  Neurological: She is alert and oriented to person, place, and time.  Skin: Skin is warm and dry.  Psychiatric: She has a normal mood and affect.     Assessment & Plan:   1. Essential hypertension Controlled Counseled on blood pressure goal of less than  130/80, low-sodium, DASH diet, medication compliance, 150 minutes of moderate intensity exercise per week. Discussed medication compliance, adverse effects. - amLODipine (NORVASC) 2.5 MG tablet; Take 1 tablet (2.5 mg total) by mouth daily.  Dispense: 30 tablet; Refill: 5 - hydrochlorothiazide (HYDRODIURIL) 25 MG tablet; Take 1 tablet (25 mg total) by mouth daily.  Dispense: 30 tablet; Refill: 5 - CMP14+EGFR; Future - Lipid panel; Future  2. Anxiety with depression Uncontrolled Complete a course of therapy BuSpar added to regimen - FLUoxetine (PROZAC) 40 MG capsule; Take 1 capsule (40 mg total) by mouth daily.  Dispense: 30 capsule; Refill: 5 - busPIRone (BUSPAR) 7.5 MG tablet; Take 1 tablet (7.5 mg total) by mouth 3 (three) times daily.  Dispense: 60 tablet; Refill: 3   Meds ordered this encounter  Medications  . amLODipine (NORVASC) 2.5 MG tablet    Sig: Take 1 tablet (2.5 mg total) by mouth daily.    Dispense:  30 tablet    Refill:  5  . FLUoxetine (PROZAC) 40 MG capsule    Sig: Take 1 capsule (40 mg total) by mouth daily.    Dispense:  30 capsule    Refill:  5    Please d/c previous fluoxetine scripts  . hydrochlorothiazide (HYDRODIURIL) 25 MG tablet    Sig: Take 1 tablet (25 mg total) by mouth daily.    Dispense:  30 tablet    Refill:  5  . busPIRone (BUSPAR) 7.5 MG tablet    Sig: Take 1 tablet (7.5 mg total) by mouth 3 (three) times daily.    Dispense:  60 tablet    Refill:  3    Follow-up: Return in about 3 months (around 09/23/2017) for follow up of chronic medical conditions.   Enobong Newlin MD   

## 2017-06-24 ENCOUNTER — Ambulatory Visit: Payer: Self-pay | Attending: Family Medicine

## 2017-06-24 DIAGNOSIS — I10 Essential (primary) hypertension: Secondary | ICD-10-CM | POA: Insufficient documentation

## 2017-06-24 NOTE — Progress Notes (Signed)
Patient here for lab visit only 

## 2017-06-25 LAB — LIPID PANEL
Chol/HDL Ratio: 6 ratio — ABNORMAL HIGH (ref 0.0–4.4)
Cholesterol, Total: 314 mg/dL — ABNORMAL HIGH (ref 100–199)
HDL: 52 mg/dL (ref >39)
LDL Calculated: 218 mg/dL — ABNORMAL HIGH (ref 0–99)
Triglycerides: 221 mg/dL — ABNORMAL HIGH (ref 0–149)
VLDL Cholesterol Cal: 44 mg/dL — ABNORMAL HIGH (ref 5–40)

## 2017-06-25 LAB — CMP14+EGFR
ALT: 22 IU/L (ref 0–32)
AST: 18 IU/L (ref 0–40)
Albumin/Globulin Ratio: 1.6 (ref 1.2–2.2)
Albumin: 4.8 g/dL (ref 3.5–5.5)
Alkaline Phosphatase: 85 IU/L (ref 39–117)
BUN/Creatinine Ratio: 15 (ref 9–23)
BUN: 14 mg/dL (ref 6–24)
Bilirubin Total: 0.3 mg/dL (ref 0.0–1.2)
CO2: 26 mmol/L (ref 20–29)
Calcium: 9.8 mg/dL (ref 8.7–10.2)
Chloride: 102 mmol/L (ref 96–106)
Creatinine, Ser: 0.95 mg/dL (ref 0.57–1.00)
GFR calc Af Amer: 83 mL/min/{1.73_m2} (ref >59)
GFR calc non Af Amer: 72 mL/min/{1.73_m2} (ref >59)
Globulin, Total: 3 g/dL (ref 1.5–4.5)
Glucose: 88 mg/dL (ref 65–99)
Potassium: 4 mmol/L (ref 3.5–5.2)
Sodium: 144 mmol/L (ref 134–144)
Total Protein: 7.8 g/dL (ref 6.0–8.5)

## 2017-06-26 ENCOUNTER — Other Ambulatory Visit: Payer: Self-pay | Admitting: Family Medicine

## 2017-06-26 ENCOUNTER — Telehealth: Payer: Self-pay

## 2017-06-26 MED ORDER — ATORVASTATIN CALCIUM 40 MG PO TABS: 40.0000 mg | ORAL_TABLET | Freq: Every day | ORAL | 3 refills | Status: DC

## 2017-06-26 NOTE — Telephone Encounter (Signed)
Patient was called and informed of lab results. 

## 2017-06-26 NOTE — Telephone Encounter (Signed)
-----   Message from Charlott Rakes, MD sent at 06/26/2017  1:31 PM EDT ----- Cholesterol is elevated and I have sent a prescription for atorvastatin to her pharmacy.  Please encourage to adhere to a low-cholesterol diet and exercise.

## 2017-07-07 ENCOUNTER — Ambulatory Visit: Payer: Self-pay | Attending: Family Medicine

## 2017-07-07 ENCOUNTER — Other Ambulatory Visit: Payer: Self-pay | Admitting: Family Medicine

## 2017-07-07 DIAGNOSIS — Z1231 Encounter for screening mammogram for malignant neoplasm of breast: Secondary | ICD-10-CM

## 2017-07-24 MED FILL — ?ATORVASTATIN 40MG TABLET: 40 | 30 days supply | Qty: 30 | Fill #0

## 2017-07-24 MED FILL — HYDROCHLOROTHIAZIDE 25 MG T: 25 | 30 days supply | Qty: 30 | Fill #3

## 2017-07-24 MED FILL — ?AMLODIPINE BESYLAT 2.5MG: 2.5 | 30 days supply | Qty: 30 | Fill #1

## 2017-08-05 ENCOUNTER — Ambulatory Visit
Admission: RE | Admit: 2017-08-05 | Discharge: 2017-08-05 | Disposition: A | Discharge status: Home / Self Care | Payer: No Typology Code available for payment source | Source: Ambulatory Visit | Attending: Family Medicine | Admitting: Family Medicine

## 2017-08-05 DIAGNOSIS — Z1231 Encounter for screening mammogram for malignant neoplasm of breast: Secondary | ICD-10-CM

## 2017-08-07 ENCOUNTER — Other Ambulatory Visit (HOSPITAL_COMMUNITY): Payer: Self-pay | Admitting: *Deleted

## 2017-08-07 DIAGNOSIS — R928 Other abnormal and inconclusive findings on diagnostic imaging of breast: Secondary | ICD-10-CM

## 2017-08-11 ENCOUNTER — Telehealth: Payer: Self-pay

## 2017-08-11 NOTE — Telephone Encounter (Signed)
Patient was called and informed of mammogram results and that she will be contacted for additional imaging.

## 2017-08-11 NOTE — Telephone Encounter (Signed)
-----   Message from Charlott Rakes, MD sent at 08/05/2017  4:45 PM EDT ----- Breast imaging revealed irregularity in the left breast.  She will be contacted by the breast center to schedule additional imaging

## 2017-08-15 ENCOUNTER — Telehealth: Payer: Self-pay | Admitting: Family Medicine

## 2017-08-15 DIAGNOSIS — Z30433 Encounter for removal and reinsertion of intrauterine contraceptive device: Secondary | ICD-10-CM

## 2017-08-15 NOTE — Telephone Encounter (Signed)
Patient called stating she received the CAFA and her PCP had stated that once she received the letter she would be referred out to the Gynecologist. Please f/u

## 2017-08-18 NOTE — Telephone Encounter (Signed)
What is the indication for GYN referral?

## 2017-08-18 NOTE — Telephone Encounter (Signed)
Will route to PCP 

## 2017-08-20 NOTE — Telephone Encounter (Signed)
Patient needs IUD removed and replaced

## 2017-08-22 NOTE — Telephone Encounter (Signed)
Will route to PCP for review. 

## 2017-08-25 NOTE — Telephone Encounter (Signed)
Referral has been placed. 

## 2017-08-28 ENCOUNTER — Ambulatory Visit: Payer: No Typology Code available for payment source

## 2017-08-28 ENCOUNTER — Other Ambulatory Visit (HOSPITAL_COMMUNITY): Payer: Self-pay | Admitting: Obstetrics and Gynecology

## 2017-08-28 ENCOUNTER — Ambulatory Visit
Admission: RE | Admit: 2017-08-28 | Discharge: 2017-08-28 | Disposition: A | Discharge status: Home / Self Care | Payer: No Typology Code available for payment source | Source: Ambulatory Visit | Attending: Obstetrics and Gynecology | Admitting: Obstetrics and Gynecology

## 2017-08-28 ENCOUNTER — Encounter (HOSPITAL_COMMUNITY): Payer: Self-pay

## 2017-08-28 ENCOUNTER — Ambulatory Visit (HOSPITAL_COMMUNITY)
Admission: RE | Admit: 2017-08-28 | Discharge: 2017-08-28 | Disposition: A | Discharge status: Home / Self Care | Payer: Self-pay | Source: Ambulatory Visit | Attending: Obstetrics and Gynecology | Admitting: Obstetrics and Gynecology

## 2017-08-28 VITALS — BP 138/93 | Ht 67.0 in

## 2017-08-28 DIAGNOSIS — N644 Mastodynia: Secondary | ICD-10-CM

## 2017-08-28 DIAGNOSIS — R928 Other abnormal and inconclusive findings on diagnostic imaging of breast: Secondary | ICD-10-CM

## 2017-08-28 DIAGNOSIS — Z1239 Encounter for other screening for malignant neoplasm of breast: Secondary | ICD-10-CM

## 2017-08-28 HISTORY — DX: Essential (primary) hypertension: I10

## 2017-08-28 NOTE — Patient Instructions (Signed)
Explained breast self awareness with Brandy [name]. Patient did not need a Pap smear today due to last Pap smear and HPV typing was 10/13/2013. Let her know [facility] will cover Pap smears and HPV typing every 5 years unless has a history of abnormal Pap smears. Referred patient to the [facility] for a left breast diagnostic mammogram and possible ultrasound per recommendation. Appointment scheduled for Thursday, August 28, 2017 at 1320.  Brandy [name] verbalized understanding.  [place], Arvil Chaco, RN 12:57 PM

## 2017-08-28 NOTE — Progress Notes (Signed)
Patient referred to [facility] by the [facility] due to recommending additional imaging of the left breast. Screening mammogram completed 08/05/2017.  Complaints of bilateral breast pain that is greater within the left breast x 2 years. Patient states the pain comes and goes. Patient rates the pain at a 5 out of 10.  Pap Smear: Pap smear not completed today. Last Pap smear was 10/13/2013 and normal with negative HPV. Per patient has no history of an abnormal Pap smear. Last Pap smear result is in Epic.  Physical exam: Breasts Breasts symmetrical. No skin abnormalities bilateral breasts. No nipple retraction bilateral breasts. No nipple discharge bilateral breasts. No lymphadenopathy. No lumps palpated bilateral breasts. Complaints of tenderness left breast at 3 o'clock next to nipple on exam. Referred patient to the [facility] for a left breast diagnostic mammogram and possible ultrasound per recommendation. Appointment scheduled for Thursday, August 28, 2017 at 1320.        Pelvic/Bimanual No Pap smear completed today since last Pap smear and HPV typing was 10/13/2013. Pap smear not indicated per [facility] guidelines.   Smoking History: Patient has never smoked.  Patient Navigation: Patient education provided. Access to services provided for patient through [facility] program.   Breast and Cervical Cancer Risk Assessment: Patient has a family history of her mother and a maternal aunt having breast cancer. Patient has no known genetic mutations or history of  radiation treatment to the chest before age 3. Patient has no history of cervical dysplasia, immunocompromised, or DES exposure in-utero.  Risk Assessment    Risk Scores      08/28/2017   Last edited by: Loletta Parish, RN   5-year risk: 1.1 %   Lifetime risk: 11.1 %

## 2017-08-29 ENCOUNTER — Encounter (HOSPITAL_COMMUNITY): Payer: Self-pay | Admitting: *Deleted

## 2017-09-08 ENCOUNTER — Other Ambulatory Visit (HOSPITAL_COMMUNITY): Payer: Self-pay | Admitting: *Deleted

## 2017-09-08 DIAGNOSIS — Z0000 Encounter for general adult medical examination without abnormal findings: Secondary | ICD-10-CM

## 2017-09-10 ENCOUNTER — Inpatient Hospital Stay: Payer: No Typology Code available for payment source

## 2017-09-10 ENCOUNTER — Inpatient Hospital Stay: Payer: No Typology Code available for payment source | Attending: Obstetrics and Gynecology | Admitting: *Deleted

## 2017-09-10 VITALS — BP 110/72 | Ht 67.0 in | Wt 172.6 lb

## 2017-09-10 DIAGNOSIS — Z0000 Encounter for general adult medical examination without abnormal findings: Secondary | ICD-10-CM

## 2017-09-10 LAB — LIPID PANEL
Cholesterol: 160 mg/dL (ref 0–200)
HDL: 38 mg/dL — ABNORMAL LOW (ref >40)
LDL Cholesterol: 96 mg/dL (ref 0–99)
Total CHOL/HDL Ratio: 4.2 RATIO
Triglycerides: 132 mg/dL (ref <150)
VLDL: 26 mg/dL (ref 0–40)

## 2017-09-10 LAB — HEMOGLOBIN A1C
Hgb A1c MFr Bld: 5.4 % (ref 4.8–5.6)
Mean Plasma Glucose: 108.28 mg/dL

## 2017-09-10 NOTE — Progress Notes (Signed)
[facility] initial screening  Clinical Measurement:  Height:  67in. Weight: 172.6lb. Blood Pressure: 121/87 Blood Pressure #2: 110/72 Fasting Labs Drawn Today, will review with patient when they result.  Medical History:  Patient states that she has been diagnosed with high cholesterol and  high blood pressure, but has not been diagnosed with  diabetes or heart disease.  Medications:  Patients states she is  taking any medications for high cholesterol and high blood pressure but not taking medicine for diabetes.  She is not taking aspirin daily to prevent heart attack or stroke.    Blood pressure, self measurement:  Patients states she does  measure blood pressure at home.    Nutrition:  Patient states she eats 0 cups of fruit and 3 cups of vegetables in an average day.  Patient states she does not eat fish regularly, she eats less than half a serving of whole grains daily. She drinks more  than 36 ounces of beverages with added sugar weekly.  She is currently watching her sodium intake.  She has not had any drinks containing alcohol in the last seven days.    Physical activity:  Patient states that she gets 0 minutes of moderate exercise in a week.  She gets 0 minutes of vigorous exercise per week.    Smoking status:  Patient states she has never smoked and is not around any smokers.    Quality of life:  Patient states that she has had 0 bad physical days out of the last 30 days. In the last 2 weeks, she has had several days that she has felt down or depressed. She has had several days in the last 2 weeks that she has had little interest or pleasure in doing things.  Risk reduction and counseling:  Patient states she wants to lose weight and increase fruit and vegetable intake.  I encouraged her to continue with current exercise regimen and increase vegetable and fruit intake.  Navigation:  I will notify patient of lab results.  Patient is aware of 2 more health coaching sessions and a follow  up.

## 2017-09-10 NOTE — Addendum Note (Signed)
Addended by: Tasia Catchings R on: 09/10/2017 09:59 AM   Modules accepted: Orders

## 2017-09-10 NOTE — Addendum Note (Signed)
Addended by: Tasia Catchings R on: 09/10/2017 09:58 AM   Modules accepted: Orders

## 2017-09-22 ENCOUNTER — Telehealth (HOSPITAL_COMMUNITY): Payer: Self-pay | Admitting: *Deleted

## 2017-09-22 NOTE — Telephone Encounter (Signed)
Health coaching 2  Labs- cholesterol 160, triglycerides 132, HDL 38, LDL 96, hemoglobin A1C  5.4, mean plasma glucose 108.28   Patient is aware and understands these labs results.  Goals- Patient states she  is walking 30 minutes/day.  Patient states she eating 3-4  fruits and vegetable daily.  She states she is using low fat dairy products.  Patient states she has cut out sugary beverages and drinking 8 glasses of water daily.   Navigation:  I will notify patient of lab results.  Patient is aware of 1 more health coaching sessions and a follow up.   Time- 10 minutes

## 2017-09-23 ENCOUNTER — Ambulatory Visit: Payer: Self-pay | Admitting: Family Medicine

## 2017-09-23 MED FILL — ?ATORVASTATIN 40MG TABLET: 40 | 30 days supply | Qty: 30 | Fill #1

## 2017-09-23 MED FILL — HYDROCHLOROTHIAZIDE 25 MG T: 25 | 30 days supply | Qty: 30 | Fill #4

## 2017-09-23 MED FILL — ?AMLODIPINE BESYLAT 2.5MG: 2.5 | 30 days supply | Qty: 30 | Fill #2

## 2017-10-16 ENCOUNTER — Encounter: Payer: Self-pay | Admitting: Family Medicine

## 2017-10-16 ENCOUNTER — Ambulatory Visit: Payer: Self-pay | Attending: Family Medicine | Admitting: Family Medicine

## 2017-10-16 VITALS — BP 111/73 | HR 72 | Temp 98.1°F | Ht 67.0 in | Wt 172.2 lb

## 2017-10-16 DIAGNOSIS — E7800 Pure hypercholesterolemia, unspecified: Secondary | ICD-10-CM | POA: Insufficient documentation

## 2017-10-16 DIAGNOSIS — Z79899 Other long term (current) drug therapy: Secondary | ICD-10-CM | POA: Insufficient documentation

## 2017-10-16 DIAGNOSIS — F329 Major depressive disorder, single episode, unspecified: Secondary | ICD-10-CM | POA: Insufficient documentation

## 2017-10-16 DIAGNOSIS — M6740 Ganglion, unspecified site: Secondary | ICD-10-CM | POA: Insufficient documentation

## 2017-10-16 DIAGNOSIS — G5600 Carpal tunnel syndrome, unspecified upper limb: Secondary | ICD-10-CM | POA: Insufficient documentation

## 2017-10-16 DIAGNOSIS — G5602 Carpal tunnel syndrome, left upper limb: Secondary | ICD-10-CM | POA: Insufficient documentation

## 2017-10-16 DIAGNOSIS — I10 Essential (primary) hypertension: Secondary | ICD-10-CM | POA: Insufficient documentation

## 2017-10-16 DIAGNOSIS — F419 Anxiety disorder, unspecified: Secondary | ICD-10-CM | POA: Insufficient documentation

## 2017-10-16 DIAGNOSIS — E785 Hyperlipidemia, unspecified: Secondary | ICD-10-CM | POA: Insufficient documentation

## 2017-10-16 MED ORDER — ATORVASTATIN CALCIUM 40 MG PO TABS: 40.0000 mg | ORAL_TABLET | Freq: Every day | ORAL | 3 refills | Status: DC

## 2017-10-16 MED ORDER — HYDROXYZINE HCL 25 MG PO TABS: 25.0000 mg | ORAL_TABLET | Freq: Three times a day (TID) | ORAL | 3 refills | Status: DC | PRN

## 2017-10-16 MED ORDER — HYDROCHLOROTHIAZIDE 25 MG PO TABS: 25.0000 mg | ORAL_TABLET | Freq: Every day | ORAL | 5 refills | Status: DC

## 2017-10-16 MED ORDER — PREDNISONE 20 MG PO TABS: 20.0000 mg | ORAL_TABLET | Freq: Every day | ORAL | 0 refills | Status: DC

## 2017-10-16 MED ORDER — GABAPENTIN 300 MG PO CAPS: 300.0000 mg | ORAL_CAPSULE | Freq: Two times a day (BID) | ORAL | 3 refills | Status: DC

## 2017-10-16 MED ORDER — SERTRALINE HCL 50 MG PO TABS: 50.0000 mg | ORAL_TABLET | Freq: Every day | ORAL | 3 refills | Status: DC

## 2017-10-16 MED FILL — hydrOXYzine HCL 25 MG TABS: 25 | 20 days supply | Qty: 60 | Fill #0

## 2017-10-16 MED FILL — GABAPENTIN 300 MG CAPSULE: 300 | 30 days supply | Qty: 60 | Fill #0

## 2017-10-16 MED FILL — SERTRALINE HCL 50 MG TABS: 50 | 30 days supply | Qty: 30 | Fill #0

## 2017-10-16 MED FILL — ATORVASTATIN 40 MG TABLET: 40 | 30 days supply | Qty: 30 | Fill #0

## 2017-10-16 MED FILL — predniSONE 20 MG TABS: 20 | 5 days supply | Qty: 5 | Fill #0

## 2017-10-16 NOTE — Progress Notes (Signed)
Subjective:  Patient ID: Brandy [name], female    DOB: [birth year]  Age: 47 y.o. MRN: [id number]  CC: Hypertension   HPI Brandy [name] is a 47 year old female with a history of hypertension, hyperlipidemia anxiety and depression here for follow-up visit.  She is doing well on her antihypertensive and statin and  denies adverse effects. She discontinued BuSpar and Prozac because they were ineffective.. Endorses excessive worrying, having crazy thoughts, trouble sleeping denies hallucinations..  Previously underwent therapy sessions which she has since discontinued.  Denies suicidal ideation or intent.  Admits to feeling depressed on some occasions but does a lot more boring. She is concerned about a left wrist not which is painful and also has carpal tunnel syndrome in her left hand with numbness which sometimes radiates up her left arm to her left shoulder.  The knot has been present for the last 1 week and she has used a brace with some improvement.  Past Medical History:  Diagnosis Date  . Arthritis   . Disc degeneration, lumbar 2011  . Hypertension     Past Surgical History:  Procedure Laterality Date  . TUBAL LIGATION      No Known Allergies   Outpatient Medications Prior to Visit  Medication Sig Dispense Refill  . amLODipine (NORVASC) 2.5 MG tablet Take 1 tablet (2.5 mg total) by mouth daily. 30 tablet 5  . atorvastatin (LIPITOR) 40 MG tablet Take 1 tablet (40 mg total) by mouth daily. 30 tablet 3  . FLUoxetine (PROZAC) 40 MG capsule Take 1 capsule (40 mg total) by mouth daily. 30 capsule 5  . hydrochlorothiazide (HYDRODIURIL) 25 MG tablet Take 1 tablet (25 mg total) by mouth daily. 30 tablet 5  . Lactobacillus-Inulin (Vaughn) CAPS Take 2 capsules by mouth 2 (two) times daily. (Patient not taking: Reported on 03/24/2017)    . meloxicam (MOBIC) 15 MG tablet Take 1 tablet (15 mg total) by mouth daily. X 10 days then prn pain (Patient not taking:  Reported on 06/26/2016) 30 tablet 1  . methocarbamol (ROBAXIN) 500 MG tablet Take 1 tablet (500 mg total) by mouth 3 (three) times daily. X 10 days then prn muscle spasm (Patient not taking: Reported on 06/26/2016) 90 tablet 0  . traZODone (DESYREL) 50 MG tablet TAKE 0.5-1 TABLETS (25-50 MG TOTAL) BY MOUTH AT BEDTIME AS NEEDED FOR SLEEP. (Patient not taking: Reported on 10/16/2017) 30 tablet 0  . busPIRone (BUSPAR) 15 MG tablet Take 1/2 tablet 3 times a day (Patient not taking: Reported on 08/28/2017) 60 tablet 3  . busPIRone (BUSPAR) 7.5 MG tablet Take 1 tablet (7.5 mg total) by mouth 3 (three) times daily. (Patient not taking: Reported on 08/28/2017) 60 tablet 3   No facility-administered medications prior to visit.     ROS Review of Systems  Constitutional: Negative for activity change, appetite change and fatigue.  HENT: Negative for congestion, sinus pressure and sore throat.   Eyes: Negative for visual disturbance.  Respiratory: Negative for cough, chest tightness, shortness of breath and wheezing.   Cardiovascular: Negative for chest pain and palpitations.  Gastrointestinal: Negative for abdominal distention, abdominal pain and constipation.  Endocrine: Negative for polydipsia.  Genitourinary: Negative for dysuria and frequency.  Musculoskeletal:       See hpi  Skin: Negative for rash.  Neurological: Negative for tremors, light-headedness and numbness.  Hematological: Does not bruise/bleed easily.  Psychiatric/Behavioral: Negative for agitation and behavioral problems.    Objective:  BP 111/73   Pulse  72   Temp 98.1 F (36.7 C) (Oral)   Ht 5\' 7"  (1.702 m)   Wt 172 lb 3.2 oz (78.1 kg)   SpO2 99%   BMI 26.97 kg/m   BP/Weight 10/16/2017 09/10/2017 08/07/5091  Systolic BP 267 124 580  Diastolic BP 73 72 93  Wt. (Lbs) 172.2 172.6 -  BMI 26.97 27.03 26.91      Physical Exam  Constitutional: She is oriented to person, place, and time. She appears well-developed and  well-nourished.  Cardiovascular: Normal rate, normal heart sounds and intact distal pulses.  No murmur heard. Pulmonary/Chest: Effort normal and breath sounds normal. She has no wheezes. She has no rales. She exhibits no tenderness.  Abdominal: Soft. Bowel sounds are normal. She exhibits no distension and no mass. There is no tenderness.  Musculoskeletal:  Positive for phalen's sign in left wrist Left wrist dorsal swelling which is fluctuant and tender  Neurological: She is alert and oriented to person, place, and time.  Skin: Skin is warm and dry.  Psychiatric: She has a normal mood and affect.    Lipid Panel     Component Value Date/Time   CHOL 160 09/10/2017 0959   CHOL 314 (H) 06/24/2017 0922   TRIG 132 09/10/2017 0959   HDL 38 (L) 09/10/2017 0959   HDL 52 06/24/2017 0922   CHOLHDL 4.2 09/10/2017 0959   VLDL 26 09/10/2017 0959   LDLCALC 96 09/10/2017 0959   LDLCALC 218 (H) 06/24/2017 0922    Assessment & Plan:   1. Essential hypertension Controlled Counseled on blood pressure goal of less than 130/80, low-sodium, DASH diet, medication compliance, 150 minutes of moderate intensity exercise per week. Discussed medication compliance, adverse effects. - hydrochlorothiazide (HYDRODIURIL) 25 MG tablet; Take 1 tablet (25 mg total) by mouth daily.  Dispense: 30 tablet; Refill: 5  2. Carpal tunnel syndrome of left wrist Advised to use wrist brace - predniSONE (DELTASONE) 20 MG tablet; Take 1 tablet (20 mg total) by mouth daily with breakfast.  Dispense: 5 tablet; Refill: 0 - gabapentin (NEURONTIN) 300 MG capsule; Take 1 capsule (300 mg total) by mouth 2 (two) times daily.  Dispense: 60 capsule; Refill: 3  3. Anxiety and depression Uncontrolled Unable to tolerate Prozac and BuSpar which I have discontinued Recommend Zoloft and hydroxyzine She already has a therapist which I have advised her to follow-up with - hydrOXYzine (ATARAX/VISTARIL) 25 MG tablet; Take 1 tablet (25 mg  total) by mouth 3 (three) times daily as needed.  Dispense: 60 tablet; Refill: 3 - sertraline (ZOLOFT) 50 MG tablet; Take 1 tablet (50 mg total) by mouth daily.  Dispense: 30 tablet; Refill: 3  4. Ganglion cyst Reassured  5. Pure hypercholesterolemia Controlled - atorvastatin (LIPITOR) 40 MG tablet; Take 1 tablet (40 mg total) by mouth daily.  Dispense: 30 tablet; Refill: 3   Meds ordered this encounter  Medications  . hydrochlorothiazide (HYDRODIURIL) 25 MG tablet    Sig: Take 1 tablet (25 mg total) by mouth daily.    Dispense:  30 tablet    Refill:  5  . atorvastatin (LIPITOR) 40 MG tablet    Sig: Take 1 tablet (40 mg total) by mouth daily.    Dispense:  30 tablet    Refill:  3  . predniSONE (DELTASONE) 20 MG tablet    Sig: Take 1 tablet (20 mg total) by mouth daily with breakfast.    Dispense:  5 tablet    Refill:  0  . gabapentin (NEURONTIN) 300  MG capsule    Sig: Take 1 capsule (300 mg total) by mouth 2 (two) times daily.    Dispense:  60 capsule    Refill:  3  . hydrOXYzine (ATARAX/VISTARIL) 25 MG tablet    Sig: Take 1 tablet (25 mg total) by mouth 3 (three) times daily as needed.    Dispense:  60 tablet    Refill:  3  . sertraline (ZOLOFT) 50 MG tablet    Sig: Take 1 tablet (50 mg total) by mouth daily.    Dispense:  30 tablet    Refill:  3    Discontinue prozac    Follow-up: Return in about 6 weeks (around 11/27/2017), or if symptoms worsen or fail to improve, for follow up on anxiety and depression.   Charlott Rakes MD

## 2017-10-16 NOTE — Patient Instructions (Signed)
Ganglion Cyst A ganglion cyst is a noncancerous, fluid-filled lump that occurs near joints or tendons. The ganglion cyst grows out of a joint or the lining of a tendon. It most often develops in the hand or wrist, but it can also develop in the shoulder, elbow, hip, knee, ankle, or foot. The round or oval ganglion cyst can be the size of a pea or larger than a grape. Increased activity may enlarge the size of the cyst because more fluid starts to build up. What are the causes? It is not known what causes a ganglion cyst to grow. However, it may be related to:  Inflammation or irritation around the joint.  An injury.  Repetitive movements or overuse.  Arthritis.  What increases the risk? Risk factors include:  Being a woman.  Being age 20-50.  What are the signs or symptoms? Symptoms may include:  A lump. This most often appears on the hand or wrist, but it can occur in other areas of the body.  Tingling.  Pain.  Numbness.  Muscle weakness.  Weak grip.  Less movement in a joint.  How is this diagnosed? Ganglion cysts are most often diagnosed based on a physical exam. Your health care provider will feel the lump and may shine a light alongside it. If it is a ganglion cyst, a light often shines through it. Your health care provider may order an X-ray, ultrasound, or MRI to rule out other conditions. How is this treated? Ganglion cysts usually go away on their own without treatment. If pain or other symptoms are involved, treatment may be needed. Treatment is also needed if the ganglion cyst limits your movement or if it gets infected. Treatment may include:  Wearing a brace or splint on your wrist or finger.  Taking anti-inflammatory medicine.  Draining fluid from the lump with a needle (aspiration).  Injecting a steroid into the joint.  Surgery to remove the ganglion cyst.  Follow these instructions at home:  Do not press on the ganglion cyst, poke it with a  needle, or hit it.  Take medicines only as directed by your health care provider.  Wear your brace or splint as directed by your health care provider.  Watch your ganglion cyst for any changes.  Keep all follow-up visits as directed by your health care provider. This is important. Contact a health care provider if:  Your ganglion cyst becomes larger or more painful.  You have increased redness, red streaks, or swelling.  You have pus coming from the lump.  You have weakness or numbness in the affected area.  You have a fever or chills. This information is not intended to replace advice given to you by your health care provider. Make sure you discuss any questions you have with your health care provider. Document Released: 02/02/2000 Document Revised: 07/13/2015 Document Reviewed: 07/20/2013 Elsevier Interactive Patient Education  2018 [employer]  

## 2017-10-16 NOTE — Progress Notes (Signed)
Patient has knot on left wrist with pain.

## 2017-11-20 MED FILL — HYDROCHLOROTHIAZIDE 25 MG T: 25 | 30 days supply | Qty: 30 | Fill #5

## 2017-11-20 MED FILL — SERTRALINE HCL 50 MG TABS: 50 | 30 days supply | Qty: 30 | Fill #1

## 2017-11-25 MED FILL — ?AMLODIPINE BESYLAT 2.5MG: 2.5 | 30 days supply | Qty: 30 | Fill #3

## 2017-11-25 MED FILL — GABAPENTIN 300 MG CAPSULE: 300 | 30 days supply | Qty: 60 | Fill #1

## 2017-12-03 ENCOUNTER — Ambulatory Visit: Payer: No Typology Code available for payment source | Admitting: Family Medicine

## 2018-01-17 ENCOUNTER — Telehealth: Payer: Self-pay

## 2018-01-19 ENCOUNTER — Telehealth: Payer: Self-pay

## 2018-01-19 NOTE — Telephone Encounter (Signed)
Health Coaching 3  Current:  Patient states that she eats 1-2 servings of fruit (pineapple) per day and 2 servings of vegetables (broccoli, cauliflower, greens, green beans, zucchini, salads) per day.  Patient states that she eats one serving of whole grains per week.  Patient states that she eats 1-2 servings of fish (whiting, tilapia) per week, usually broiled or grilled.  Patient states that she is planning to try salmon soon.  Patient states that she occasionally eats tuna.  Patient states that she drinks sugar drinks (sweet tea, Dr. Malachi Bonds) "all the time".  Patient states that she drinks 1-2 bottles of water per week.  Patient states that she eats fried foods 1-2 times/week.  Patient states that she doesn't currently do any physical activity.  Patient states that she does not smoke.  New Goal:    Goal #1: Patient states her goal is to increase her physical activity by walking 30 minutes per day, at least four days/week by March 21, 2018.  Barrier(s) to reaching goal:  Patient states that her knees and back may be a barrier to achieving her goal.  Strategies to overcome barrier(s):  Patient states that she doesn't have a strategy for overcoming the barrier.  Confidence Level for achieving goal (1-10):  Patient states that her confidence level for achieving her goal is 9.  Goal #2: Patient states her goal is to increase her physical activity by walking one hour per day, at least four days/week by July 20, 2018.  Barrier(s) to reaching goal:  Patient states that her knees and back may be a barrier to achieving her goal.  Strategies to overcome barrier(s):  Patient states that she doesn't have a strategy for overcoming the barrier.  Confidence Level for achieving goal (1-10):  Patient states that her confidence level for achieving her goal is 9.  Goal #3: Patient states her goal is to increase her water intake to one bottle per day by May 20, 2018.  Barrier(s) to reaching goal:  Patient  states that forgetting about her goal is a potential barrier to achieving her goal.  Strategies to overcome barrier(s):  Patient states she will put her goal on her mirror to avoid the "out of sight, out of mind" barrier.  Confidence Level for achieving goal (1-10):  Patient states that her confidence level for achieving her goal is 7.  Navigation:  Patient is aware of a follow-up session, said the best time for [place] to call her would be Tuesday, Wednesday, or Thursday during the day.  Time:  27 minutes

## 2018-01-28 ENCOUNTER — Telehealth: Payer: Self-pay | Admitting: Family Medicine

## 2018-01-28 MED FILL — AMLODIPINE 2.5 MG TABLET: 2.5 | 30 days supply | Qty: 30 | Fill #4

## 2018-01-28 MED FILL — HYDROCHLOROTHIAZIDE 25 MG T: 25 | 30 days supply | Qty: 30 | Fill #0

## 2018-01-28 NOTE — Telephone Encounter (Signed)
Patient called to get a same day appointment however, there were no appointments available. patient was advised to go to UC or the ED if she needed to.

## 2018-01-29 ENCOUNTER — Ambulatory Visit (HOSPITAL_COMMUNITY)
Admission: EM | Admit: 2018-01-29 | Discharge: 2018-01-29 | Disposition: A | Discharge status: Home / Self Care | Payer: No Typology Code available for payment source | Attending: Family Medicine | Admitting: Family Medicine

## 2018-01-29 ENCOUNTER — Encounter (HOSPITAL_COMMUNITY): Payer: Self-pay | Admitting: Emergency Medicine

## 2018-01-29 DIAGNOSIS — J0100 Acute maxillary sinusitis, unspecified: Secondary | ICD-10-CM

## 2018-01-29 MED ORDER — AMOXICILLIN 875 MG PO TABS: 875.0000 mg | ORAL_TABLET | Freq: Two times a day (BID) | ORAL | 0 refills | Status: DC

## 2018-01-29 MED ORDER — FLUTICASONE PROPIONATE 50 MCG/ACT NA SUSP: 2.0000 | Freq: Every day | NASAL | 12 refills | Status: DC

## 2018-01-29 MED ORDER — BENZONATATE 100 MG PO CAPS: 100.0000 mg | ORAL_CAPSULE | Freq: Three times a day (TID) | ORAL | 0 refills | Status: DC | PRN

## 2018-01-29 MED FILL — FLUTICASONE PROP 50 MCG SPR: 50 | 16 days supply | Qty: 16 | Fill #0

## 2018-01-29 MED FILL — AMOXICILLIN 875 MG TABS: 875 | 10 days supply | Qty: 20 | Fill #0

## 2018-01-29 NOTE — ED Triage Notes (Signed)
Pt presents to [facility] for assessment of 2 weeks of nasal congestion, headaches, sore throat, cough

## 2018-01-29 NOTE — ED Provider Notes (Signed)
[place]    CSN: [id number] Arrival date & time: 01/29/18  2778     History   Chief Complaint Chief Complaint  Patient presents with  . URI    HPI Brandy [name] is a 47 y.o. female.   This is an initial [facility] care visit for a 47 year old woman who presents to [facility] for assessment of 2 weeks of nasal congestion, headaches, sore throat, cough     Past Medical History:  Diagnosis Date  . Arthritis   . Disc degeneration, lumbar 2011  . Hypertension     Patient Active Problem List   Diagnosis Date Noted  . Carpal tunnel syndrome 10/16/2017  . Anxiety and depression 10/16/2017    Past Surgical History:  Procedure Laterality Date  . TUBAL LIGATION      OB History    Gravida  2   Para  2   Term  2   Preterm      AB      Living  3     SAB      TAB      Ectopic      Multiple  1   Live Births  3            Home Medications    Prior to Admission medications   Medication Sig Start Date End Date Taking? Authorizing Provider  amLODipine (NORVASC) 2.5 MG tablet Take 1 tablet (2.5 mg total) by mouth daily. 06/23/17   Charlott Rakes, MD  amoxicillin (AMOXIL) 875 MG tablet Take 1 tablet (875 mg total) by mouth 2 (two) times daily. 01/29/18   Robyn Haber, MD  atorvastatin (LIPITOR) 40 MG tablet Take 1 tablet (40 mg total) by mouth daily. 10/16/17   Charlott Rakes, MD  benzonatate (TESSALON) 100 MG capsule Take 1-2 capsules (100-200 mg total) by mouth 3 (three) times daily as needed for cough. 01/29/18   Robyn Haber, MD  fluticasone (FLONASE) 50 MCG/ACT nasal spray Place 2 sprays into both nostrils daily. 01/29/18   Robyn Haber, MD  hydrochlorothiazide (HYDRODIURIL) 25 MG tablet Take 1 tablet (25 mg total) by mouth daily. 10/16/17   Charlott Rakes, MD  hydrOXYzine (ATARAX/VISTARIL) 25 MG tablet Take 1 tablet (25 mg total) by mouth 3 (three) times daily as needed. 10/16/17   Charlott Rakes, MD    Lactobacillus-Inulin ([place]) CAPS Take 2 capsules by mouth 2 (two) times daily. Patient not taking: Reported on 03/24/2017 06/26/16   Alfonse Spruce, FNP  sertraline (ZOLOFT) 50 MG tablet Take 1 tablet (50 mg total) by mouth daily. 10/16/17   Charlott Rakes, MD  traZODone (DESYREL) 50 MG tablet TAKE 0.5-1 TABLETS (25-50 MG TOTAL) BY MOUTH AT BEDTIME AS NEEDED FOR SLEEP. Patient not taking: Reported on 10/16/2017 11/11/16   Alfonse Spruce, FNP    Family History Family History  Problem Relation Age of Onset  . Cancer Paternal Grandfather        prostate  . Hypertension Paternal Grandfather   . Breast cancer Mother 20  . Thyroid disease Mother   . Diabetes Father   . Breast cancer Maternal Aunt 52       died from cancer    Social History Social History   Tobacco Use  . Smoking status: Never Smoker  . Smokeless tobacco: Never Used  Substance Use Topics  . Alcohol use: No  . Drug use: No     Allergies   Patient has no known allergies.  Review of Systems Review of Systems  HENT: Positive for congestion and sore throat.   Respiratory: Positive for cough.   Neurological: Positive for headaches.  All other systems reviewed and are negative.    Physical Exam Triage Vital Signs ED Triage Vitals  Enc Vitals Group     BP 01/29/18 1012 (!) 146/110     Pulse Rate 01/29/18 1012 87     Resp 01/29/18 1012 18     Temp 01/29/18 1012 98 F (36.7 C)     Temp Source 01/29/18 1012 Oral     SpO2 01/29/18 1012 96 %     Weight --      Height --      Head Circumference --      Peak Flow --      Pain Score 01/29/18 1013 7     Pain Loc --      Pain Edu? --      Excl. in [place]? --    No data found.  Updated Vital Signs BP (!) 146/110 (BP Location: Right Arm)   Pulse 87   Temp 98 F (36.7 C) (Oral)   Resp 18   SpO2 96%    Physical Exam Vitals signs and nursing note reviewed.  Constitutional:      Appearance: Normal appearance.  HENT:      Head: Normocephalic and atraumatic.     Right Ear: Ear canal and external ear normal.     Left Ear: Ear canal and external ear normal.     Ears:     Comments: Bilateral clear fluid meniscus behind TM's    Mouth/Throat:     Mouth: Mucous membranes are moist.  Eyes:     Conjunctiva/sclera: Conjunctivae normal.     Pupils: Pupils are equal, round, and reactive to light.  Neck:     Musculoskeletal: Normal range of motion and neck supple.  Cardiovascular:     Rate and Rhythm: Normal rate and regular rhythm.     Heart sounds: Normal heart sounds.  Pulmonary:     Effort: Pulmonary effort is normal.     Breath sounds: Normal breath sounds.  Musculoskeletal: Normal range of motion.  Lymphadenopathy:     Cervical: No cervical adenopathy.  Skin:    General: Skin is warm and dry.  Neurological:     General: No focal deficit present.     Mental Status: She is alert and oriented to person, place, and time.  Psychiatric:        Mood and Affect: Mood normal.      UC Treatments / Results  Labs (all labs ordered are listed, but only abnormal results are displayed) Labs Reviewed - No data to display  EKG None  Radiology No results found.  Procedures Procedures (including critical care time)  Medications Ordered in UC Medications - No data to display  Initial Impression / Assessment and Plan / UC Course  I have reviewed the triage vital signs and the nursing notes.  Pertinent labs & imaging results that were available during my care of the patient were reviewed by me and considered in my medical decision making (see chart for details).    Final Clinical Impressions(s) / UC Diagnoses   Final diagnoses:  Acute non-recurrent maxillary sinusitis   Discharge Instructions   None    ED Prescriptions    Medication Sig Dispense Auth. Provider   amoxicillin (AMOXIL) 875 MG tablet Take 1 tablet (875 mg total) by mouth 2 (two) times daily.  20 tablet Robyn Haber, MD    benzonatate (TESSALON) 100 MG capsule Take 1-2 capsules (100-200 mg total) by mouth 3 (three) times daily as needed for cough. 40 capsule Robyn Haber, MD   fluticasone [facility]) 50 MCG/ACT nasal spray Place 2 sprays into both nostrils daily. 16 g Robyn Haber, MD     Controlled Substance Prescriptions [place] Controlled Substance Registry consulted? Not Applicable   Robyn Haber, MD 01/29/18 1022

## 2018-02-09 ENCOUNTER — Telehealth (HOSPITAL_COMMUNITY): Payer: Self-pay | Admitting: Emergency Medicine

## 2018-02-09 MED ORDER — FLUCONAZOLE 150 MG PO TABS: 150.0000 mg | ORAL_TABLET | Freq: Once | ORAL | 0 refills | Status: AC

## 2018-02-09 MED FILL — FLUCONAZOLE 150 MG TABS: 150 | 4 days supply | Qty: 2 | Fill #0

## 2018-02-09 NOTE — Telephone Encounter (Signed)
Pt called stating she had yeast infection from antibiotics. Requesting diflucan. Sent to preferred pharmacy.

## 2018-03-20 MED FILL — HYDROCHLOROTHIAZIDE 25 MG T: 25 | 30 days supply | Qty: 30 | Fill #1

## 2018-03-20 MED FILL — AMLODIPINE 2.5 MG TABLET: 2.5 | 30 days supply | Qty: 30 | Fill #5

## 2018-04-22 ENCOUNTER — Ambulatory Visit: Payer: Self-pay | Attending: Family Medicine | Admitting: Family Medicine

## 2018-04-22 ENCOUNTER — Encounter: Payer: Self-pay | Admitting: Family Medicine

## 2018-04-22 ENCOUNTER — Other Ambulatory Visit: Payer: Self-pay

## 2018-04-22 VITALS — BP 123/86 | HR 80 | Temp 98.1°F | Ht 67.0 in | Wt 167.6 lb

## 2018-04-22 DIAGNOSIS — Z0000 Encounter for general adult medical examination without abnormal findings: Secondary | ICD-10-CM

## 2018-04-22 DIAGNOSIS — Z124 Encounter for screening for malignant neoplasm of cervix: Secondary | ICD-10-CM

## 2018-04-22 DIAGNOSIS — Z1159 Encounter for screening for other viral diseases: Secondary | ICD-10-CM

## 2018-04-22 DIAGNOSIS — M25532 Pain in left wrist: Secondary | ICD-10-CM

## 2018-04-22 DIAGNOSIS — I10 Essential (primary) hypertension: Secondary | ICD-10-CM

## 2018-04-22 DIAGNOSIS — Z30432 Encounter for removal of intrauterine contraceptive device: Secondary | ICD-10-CM

## 2018-04-22 MED ORDER — HYDROCHLOROTHIAZIDE 25 MG PO TABS: 25.0000 mg | ORAL_TABLET | Freq: Every day | ORAL | 6 refills | Status: DC

## 2018-04-22 MED ORDER — AMLODIPINE BESYLATE 2.5 MG PO TABS: 2.5000 mg | ORAL_TABLET | Freq: Every day | ORAL | 6 refills | Status: DC

## 2018-04-22 MED FILL — ?AMLODIPINE BESYLATE 2.5MG.: 2.5 | 30 days supply | Qty: 30 | Fill #0

## 2018-04-22 MED FILL — HYDROCHLOROTHIAZIDE 25 MG T: 25 | 30 days supply | Qty: 30 | Fill #0

## 2018-04-22 NOTE — Progress Notes (Signed)
C/C: Physical, left wrist pain.  REFILLS: amlodipine, hctz

## 2018-04-22 NOTE — Patient Instructions (Signed)
Health Maintenance After Age 48 After age 48, you are at a higher risk for certain long-term diseases and infections as well as injuries from falls. Falls are a major cause of broken bones and head injuries in people who are older than age 48. Getting regular preventive care can help to keep you healthy and well. Preventive care includes getting regular testing and making lifestyle changes as recommended by your health care provider. Talk with your health care provider about:  Which screenings and tests you should have. A screening is a test that checks for a disease when you have no symptoms.  A diet and exercise plan that is right for you. What should I know about screenings and tests to prevent falls? Screening and testing are the best ways to find a health problem early. Early diagnosis and treatment give you the best chance of managing medical conditions that are common after age 48. Certain conditions and lifestyle choices may make you more likely to have a fall. Your health care provider may recommend:  Regular vision checks. Poor vision and conditions such as cataracts can make you more likely to have a fall. If you wear glasses, make sure to get your prescription updated if your vision changes.  Medicine review. Work with your health care provider to regularly review all of the medicines you are taking, including over-the-counter medicines. Ask your health care provider about any side effects that may make you more likely to have a fall. Tell your health care provider if any medicines that you take make you feel dizzy or sleepy.  Osteoporosis screening. Osteoporosis is a condition that causes the bones to get weaker. This can make the bones weak and cause them to break more easily.  Blood pressure screening. Blood pressure changes and medicines to control blood pressure can make you feel dizzy.  Strength and balance checks. Your health care provider may recommend certain tests to check your  strength and balance while standing, walking, or changing positions.  Foot health exam. Foot pain and numbness, as well as not wearing proper footwear, can make you more likely to have a fall.  Depression screening. You may be more likely to have a fall if you have a fear of falling, feel emotionally low, or feel unable to do activities that you used to do.  Alcohol use screening. Using too much alcohol can affect your balance and may make you more likely to have a fall. What actions can I take to lower my risk of falls? General instructions  Talk with your health care provider about your risks for falling. Tell your health care provider if: ? You fall. Be sure to tell your health care provider about all falls, even ones that seem minor. ? You feel dizzy, sleepy, or off-balance.  Take over-the-counter and prescription medicines only as told by your health care provider. These include any supplements.  Eat a healthy diet and maintain a healthy weight. A healthy diet includes low-fat dairy products, low-fat (lean) meats, and fiber from whole grains, beans, and lots of fruits and vegetables. Home safety  Remove any tripping hazards, such as rugs, cords, and clutter.  Install safety equipment such as grab bars in bathrooms and safety rails on stairs.  Keep rooms and walkways well-lit. Activity   Follow a regular exercise program to stay fit. This will help you maintain your balance. Ask your health care provider what types of exercise are appropriate for you.  If you need a cane or   walker, use it as recommended by your health care provider.  Wear supportive shoes that have nonskid soles. Lifestyle  Do not drink alcohol if your health care provider tells you not to drink.  If you drink alcohol, limit how much you have: ? 0-1 drink a day for women. ? 0-2 drinks a day for men.  Be aware of how much alcohol is in your drink. In the [place] one drink equals one typical bottle of beer (12  oz), one-half glass of wine (5 oz), or one shot of hard liquor (1 oz).  Do not use any products that contain nicotine or tobacco, such as cigarettes and e-cigarettes. If you need help quitting, ask your health care provider. Summary  Having a healthy lifestyle and getting preventive care can help to protect your health and wellness after age 48.  Screening and testing are the best way to find a health problem early and help you avoid having a fall. Early diagnosis and treatment give you the best chance for managing medical conditions that are more common for people who are older than age 48.  Falls are a major cause of broken bones and head injuries in people who are older than age 48. Take precautions to prevent a fall at home.  Work with your health care provider to learn what changes you can make to improve your health and wellness and to prevent falls. This information is not intended to replace advice given to you by your health care provider. Make sure you discuss any questions you have with your health care provider. Document Released: 12/18/2016 Document Revised: 12/18/2016 Document Reviewed: 12/18/2016 Elsevier Interactive Patient Education  2019 Elsevier Inc.  

## 2018-04-22 NOTE — Progress Notes (Signed)
Subjective:  Patient ID: Brandy [name], female    DOB: [birth year]  Age: 48 y.o. MRN: [id number]  CC: Annual Exam   HPI Brandy [name] is a 48 year old female with a history of hypertension, hyperlipidemia anxiety and depression here for a complete physical exam. She had a normal mammogram in 08/2017. Today she still complains of pain on the dorsum of her left wrist which was thought to be a ganglion cyts at her previous visit and symptoms improved with the use of a wrist brace.  She is involved in repetitive hand movements. She would like to have her IUD removed today as it has been present for greater than 5 years.  We have discussed other options for IUD placement including the health department and GYN referral and she will be in touch with me if she so desires to do so and I will place a GYN referral after she has been approved for the [facility] financial discount as she has no medical coverage.  Past Medical History:  Diagnosis Date  . Arthritis   . Disc degeneration, lumbar 2011  . Hypertension     Past Surgical History:  Procedure Laterality Date  . TUBAL LIGATION      Family History  Problem Relation Age of Onset  . Cancer Paternal Grandfather        prostate  . Hypertension Paternal Grandfather   . Breast cancer Mother 59  . Thyroid disease Mother   . Diabetes Father   . Breast cancer Maternal Aunt 72       died from cancer    No Known Allergies  Outpatient Medications Prior to Visit  Medication Sig Dispense Refill  . amLODipine (NORVASC) 2.5 MG tablet Take 1 tablet (2.5 mg total) by mouth daily. 30 tablet 5  . hydrochlorothiazide (HYDRODIURIL) 25 MG tablet Take 1 tablet (25 mg total) by mouth daily. 30 tablet 5  . amoxicillin (AMOXIL) 875 MG tablet Take 1 tablet (875 mg total) by mouth 2 (two) times daily. (Patient not taking: Reported on 04/22/2018) 20 tablet 0  . atorvastatin (LIPITOR) 40 MG tablet Take 1 tablet (40 mg total) by mouth daily. (Patient not  taking: Reported on 04/22/2018) 30 tablet 3  . benzonatate (TESSALON) 100 MG capsule Take 1-2 capsules (100-200 mg total) by mouth 3 (three) times daily as needed for cough. (Patient not taking: Reported on 04/22/2018) 40 capsule 0  . fluticasone (FLONASE) 50 MCG/ACT nasal spray Place 2 sprays into both nostrils daily. (Patient not taking: Reported on 04/22/2018) 16 g 12  . hydrOXYzine (ATARAX/VISTARIL) 25 MG tablet Take 1 tablet (25 mg total) by mouth 3 (three) times daily as needed. (Patient not taking: Reported on 04/22/2018) 60 tablet 3  . Lactobacillus-Inulin ([place]) CAPS Take 2 capsules by mouth 2 (two) times daily. (Patient not taking: Reported on 03/24/2017)    . sertraline (ZOLOFT) 50 MG tablet Take 1 tablet (50 mg total) by mouth daily. (Patient not taking: Reported on 04/22/2018) 30 tablet 3  . traZODone (DESYREL) 50 MG tablet TAKE 0.5-1 TABLETS (25-50 MG TOTAL) BY MOUTH AT BEDTIME AS NEEDED FOR SLEEP. (Patient not taking: Reported on 10/16/2017) 30 tablet 0   No facility-administered medications prior to visit.      ROS Review of Systems General: negative for fever, weight loss, appetite change Eyes: no visual symptoms. ENT: no ear symptoms, no sinus tenderness, no nasal congestion or sore throat. Neck: no pain  Respiratory: no wheezing, shortness of breath, cough Cardiovascular:  no chest pain, no dyspnea on exertion, no pedal edema, no orthopnea. Gastrointestinal: no abdominal pain, no diarrhea, no constipation Genito-Urinary: no urinary frequency, no dysuria, no polyuria. Hematologic: no bruising Endocrine: no cold or heat intolerance Neurological: no headaches, no seizures, no tremors Musculoskeletal: see hpi Skin: no pruritus, no rash. Psychological: no depression, no anxiety,    Objective:  BP 123/86   Pulse 80   Temp 98.1 F (36.7 C) (Oral)   Ht 5\' 7"  (1.702 m)   Wt 167 lb 9.6 oz (76 kg)   SpO2 98%   BMI 26.25 kg/m   BP/Weight 04/22/2018 01/29/2018  3/38/2505  Systolic BP 397 673 419  Diastolic BP 86 379 73  Wt. (Lbs) 167.6 - 172.2  BMI 26.25 - 26.97      Physical Exam Constitutional: normal appearing,  Eyes: PERRLA HEENT: Head is atraumatic, normal sinuses, normal oropharynx, normal appearing tonsils and palate, tympanic membrane is normal bilaterally. Neck: normal range of motion, no thyromegaly, no JVD Cardiovascular: normal rate and rhythm, normal heart sounds, no murmurs, rub or gallop, no pedal edema Respiratory: Normal breath sounds, clear to auscultation bilaterally, no wheezes, no rales, no rhonchi Breasts: Normal appearance, no tenderness, no masses Abdomen: soft, not tender to palpation, normal bowel sounds, no enlarged organs Musculoskeletal: Full ROM, no tenderness in joints Genitourinary: Normal external genitalia, normal vagina, IUD string visible from cervical os, no cervical motion tenderness, normal adnexa Skin: warm and dry, no lesions. Neurological: alert, oriented x3, cranial nerves I-XII grossly intact , normal motor strength, normal sensation. Psychological: normal mood.    CMP Latest Ref Rng & Units 06/24/2017 06/26/2016 05/14/2016  Glucose 65 - 99 mg/dL 88 80 105(H)  BUN 6 - 24 mg/dL 14 16 11   Creatinine 0.57 - 1.00 mg/dL 0.95 1.01(H) 0.93  Sodium 134 - 144 mmol/L 144 142 139  Potassium 3.5 - 5.2 mmol/L 4.0 4.1 4.1  Chloride 96 - 106 mmol/L 102 99 103  CO2 20 - 29 mmol/L 26 28 23   Calcium 8.7 - 10.2 mg/dL 9.8 9.8 9.3  Total Protein 6.0 - 8.5 g/dL 7.8 - -  Total Bilirubin 0.0 - 1.2 mg/dL 0.3 - -  Alkaline Phos 39 - 117 IU/L 85 - -  AST 0 - 40 IU/L 18 - -  ALT 0 - 32 IU/L 22 - -    Lipid Panel     Component Value Date/Time   CHOL 160 09/10/2017 0959   CHOL 314 (H) 06/24/2017 0922   TRIG 132 09/10/2017 0959   HDL 38 (L) 09/10/2017 0959   HDL 52 06/24/2017 0922   CHOLHDL 4.2 09/10/2017 0959   VLDL 26 09/10/2017 0959   LDLCALC 96 09/10/2017 0959   LDLCALC 218 (H) 06/24/2017 0922    CBC     Component Value Date/Time   WBC 7.8 06/26/2016 1623   WBC 6.2 05/14/2016 1418   RBC 5.22 06/26/2016 1623   RBC 5.06 05/14/2016 1418   HGB 15.8 06/26/2016 1623   HCT 45.7 06/26/2016 1623   PLT 265 06/26/2016 1623   MCV 88 06/26/2016 1623   MCH 30.3 06/26/2016 1623   MCH 30.6 05/14/2016 1418   MCHC 34.6 06/26/2016 1623   MCHC 34.1 05/14/2016 1418   RDW 14.0 06/26/2016 1623   LYMPHSABS 1.7 06/26/2016 1623   EOSABS 0.0 06/26/2016 1623   BASOSABS 0.0 06/26/2016 1623    Lab Results  Component Value Date   HGBA1C 5.4 09/10/2017    Assessment & Plan:   1.  Essential hypertension Controlled - amLODipine (NORVASC) 2.5 MG tablet; Take 1 tablet (2.5 mg total) by mouth daily.  Dispense: 30 tablet; Refill: 6 - hydrochlorothiazide (HYDRODIURIL) 25 MG tablet; Take 1 tablet (25 mg total) by mouth daily.  Dispense: 30 tablet; Refill: 6  2. Annual physical exam Counseled on 150 minutes of exercise per week, healthy eating (including decreased daily intake of saturated fats, cholesterol, added sugars, sodium), STI prevention, routine healthcare maintenance.   3. Screening for viral disease - HIV Antibody (routine testing w rflx)  4. Screening for cervical cancer - Cytology - PAP([facility])  5. Encounter for IUD removal ED removed patient tolerated this well   6. Left wrist pain Improved with wrist brace; use NSAIDs for pain if symptoms persist will consider orthopedic referral for possible cortisone injection - DG Wrist Complete Left; Future   Meds ordered this encounter  Medications  . amLODipine (NORVASC) 2.5 MG tablet    Sig: Take 1 tablet (2.5 mg total) by mouth daily.    Dispense:  30 tablet    Refill:  6  . hydrochlorothiazide (HYDRODIURIL) 25 MG tablet    Sig: Take 1 tablet (25 mg total) by mouth daily.    Dispense:  30 tablet    Refill:  6    Follow-up: Return in about 6 months (around 10/23/2018) for Follow-up of chronic medical conditions.       Charlott Rakes, MD, FAAFP. [facility] [place], [place]   04/22/2018, 11:47 AM

## 2018-04-23 ENCOUNTER — Telehealth: Payer: Self-pay

## 2018-04-23 DIAGNOSIS — Z0000 Encounter for general adult medical examination without abnormal findings: Secondary | ICD-10-CM

## 2018-04-23 LAB — HIV ANTIBODY (ROUTINE TESTING W REFLEX): HIV Screen 4th Generation wRfx: NONREACTIVE

## 2018-04-23 NOTE — Telephone Encounter (Signed)
Done

## 2018-04-23 NOTE — Telephone Encounter (Signed)
Cytology needs an order for aptima swab sent with pap on yesterday.

## 2018-04-24 ENCOUNTER — Telehealth: Payer: Self-pay

## 2018-04-24 LAB — CYTOLOGY - PAP
Diagnosis: NEGATIVE
HPV: NOT DETECTED

## 2018-04-24 LAB — CERVICOVAGINAL ANCILLARY ONLY
Bacterial vaginitis: POSITIVE — AB
Candida vaginitis: NEGATIVE
Chlamydia: NEGATIVE
Neisseria Gonorrhea: NEGATIVE
Trichomonas: NEGATIVE

## 2018-04-24 NOTE — Telephone Encounter (Signed)
Patient was called and informed of lab results. 

## 2018-04-24 NOTE — Telephone Encounter (Signed)
-----   Message from Charlott Rakes, MD sent at 04/23/2018 12:05 PM EST ----- Please inform the patient that labs are normal. Thank you.

## 2018-04-27 ENCOUNTER — Encounter: Payer: Self-pay | Admitting: Family Medicine

## 2018-04-27 ENCOUNTER — Other Ambulatory Visit: Payer: Self-pay | Admitting: Family Medicine

## 2018-04-27 MED ORDER — METRONIDAZOLE 0.75 % VA GEL: 1.0000 | Freq: Every day | VAGINAL | 0 refills | Status: DC

## 2018-04-27 MED FILL — METROGEL-VAGINAL 0.75% GEL: 0.75 | 10 days supply | Qty: 70 | Fill #0

## 2018-04-27 NOTE — Telephone Encounter (Signed)
Patient is requesting lab results. Patient received blood work result. Patient is requesting cytology results

## 2018-04-27 NOTE — Telephone Encounter (Signed)
Patient was called and informed of lab results. 

## 2018-06-22 MED FILL — HYDROCHLOROTHIAZIDE 25 MG T: 25 | 30 days supply | Qty: 30 | Fill #1

## 2018-06-22 MED FILL — ?AMLODIPINE BESYLATE 2.5MG.: 2.5 | 30 days supply | Qty: 30 | Fill #1

## 2018-07-27 MED FILL — HYDROCHLOROTHIAZIDE 25 MG T: 25 | 30 days supply | Qty: 30 | Fill #2

## 2018-07-27 MED FILL — AMLODIPINE 2.5 MG TABLET: 2.5 | 30 days supply | Qty: 30 | Fill #2

## 2018-07-30 ENCOUNTER — Telehealth: Payer: Self-pay | Admitting: Family Medicine

## 2018-07-30 NOTE — Telephone Encounter (Signed)
Pt was call ed to be remind that he need to bring the application and the paperwork that she need to apply for CAFa and OC, the dateline is today

## 2018-08-04 ENCOUNTER — Other Ambulatory Visit: Payer: Self-pay

## 2018-08-04 ENCOUNTER — Ambulatory Visit: Payer: Self-pay | Attending: Family Medicine

## 2018-08-13 ENCOUNTER — Telehealth: Payer: No Typology Code available for payment source | Admitting: Family

## 2018-08-13 DIAGNOSIS — M5440 Lumbago with sciatica, unspecified side: Secondary | ICD-10-CM

## 2018-08-13 NOTE — Progress Notes (Signed)
Based on what you shared with me, I feel your condition warrants further evaluation and I recommend that you be seen for a face to face office visit.  NOTE: If you entered your credit card information for this eVisit, you will not be charged. You may see a "hold" on your card for the $35 but that hold will drop off and you will not have a charge processed.  If you are having a true medical emergency please call 911.     For an urgent face to face visit, [facility] has five urgent care centers for your convenience:    [email or web address] to reserve your spot online an avoid wait times  [facility] [address] [place], [place] [address] Modified hours of operation: Monday-Friday, 12 PM to 6 PM  Closed Saturday & Sunday  *Across the street from [place] (New Address!) [address] [place], [place] [address] *Just off [address], across the road from [place] hours of operation: Monday-Friday, 12 PM to 6 PM  Closed Saturday & Sunday   The following sites will take your insurance:  . [facility]    [phone]                  Get Driving Directions  [address], [place] [address] . 10 am to 8 pm Monday-Friday . 12 pm to 8 pm Saturday-Sunday   . [facility] at [place]                  Get Driving Directions  [address], [place], [place] [address] . 8 am to 8 pm Monday-Friday . 9 am to 6 pm Saturday . 11 am to 6 pm Sunday   . [facility] at [place]                  Get Driving Directions   [address] [place], [place] [address] . 8 am to 8 pm Monday-Friday . 8 am to 4 pm Saturday-Sunday    . [facility] at                     Get Driving Directions  [phone]  [address], [place], [place] [address]   . Monday-Friday, 12 PM to 6 PM    Your e-visit answers were reviewed by a board certified advanced clinical practitioner to complete your personal care plan.  Thank you for using e-Visits.

## 2018-08-19 ENCOUNTER — Ambulatory Visit: Payer: Self-pay | Attending: Family Medicine | Admitting: Family Medicine

## 2018-08-19 ENCOUNTER — Encounter: Payer: Self-pay | Admitting: Family Medicine

## 2018-08-19 ENCOUNTER — Other Ambulatory Visit: Payer: Self-pay

## 2018-08-19 VITALS — BP 111/77 | HR 67 | Temp 98.2°F | Ht 67.0 in | Wt 161.6 lb

## 2018-08-19 DIAGNOSIS — E7800 Pure hypercholesterolemia, unspecified: Secondary | ICD-10-CM

## 2018-08-19 DIAGNOSIS — M5431 Sciatica, right side: Secondary | ICD-10-CM

## 2018-08-19 DIAGNOSIS — F32A Depression, unspecified: Secondary | ICD-10-CM

## 2018-08-19 DIAGNOSIS — F419 Anxiety disorder, unspecified: Secondary | ICD-10-CM

## 2018-08-19 DIAGNOSIS — F329 Major depressive disorder, single episode, unspecified: Secondary | ICD-10-CM

## 2018-08-19 DIAGNOSIS — I10 Essential (primary) hypertension: Secondary | ICD-10-CM

## 2018-08-19 DIAGNOSIS — K580 Irritable bowel syndrome with diarrhea: Secondary | ICD-10-CM

## 2018-08-19 MED ORDER — SERTRALINE HCL 50 MG PO TABS: 50.0000 mg | ORAL_TABLET | Freq: Every day | ORAL | 6 refills | Status: DC

## 2018-08-19 MED ORDER — HYDROXYZINE HCL 25 MG PO TABS: 25.0000 mg | ORAL_TABLET | Freq: Three times a day (TID) | ORAL | 3 refills | Status: DC | PRN

## 2018-08-19 MED ORDER — AMLODIPINE BESYLATE 2.5 MG PO TABS: 2.5000 mg | ORAL_TABLET | Freq: Every day | ORAL | 6 refills | Status: DC

## 2018-08-19 MED ORDER — DICYCLOMINE HCL 10 MG PO CAPS: 10.0000 mg | ORAL_CAPSULE | Freq: Three times a day (TID) | ORAL | 1 refills | Status: DC

## 2018-08-19 MED ORDER — CYCLOBENZAPRINE HCL 10 MG PO TABS: 10.0000 mg | ORAL_TABLET | Freq: Two times a day (BID) | ORAL | 1 refills | Status: DC | PRN

## 2018-08-19 MED ORDER — ATORVASTATIN CALCIUM 40 MG PO TABS: 40.0000 mg | ORAL_TABLET | Freq: Every day | ORAL | 6 refills | Status: DC

## 2018-08-19 MED ORDER — IBUPROFEN 600 MG PO TABS: 600.0000 mg | ORAL_TABLET | Freq: Two times a day (BID) | ORAL | 0 refills | Status: DC | PRN

## 2018-08-19 MED ORDER — HYDROCHLOROTHIAZIDE 25 MG PO TABS: 25.0000 mg | ORAL_TABLET | Freq: Every day | ORAL | 6 refills | Status: DC

## 2018-08-19 MED FILL — DICYCLOMINE 10 MG CAPSULE: 10 | 30 days supply | Qty: 90 | Fill #0

## 2018-08-19 MED FILL — CYCLOBENZAPRINE 10 MG TAB: 10 | 30 days supply | Qty: 60 | Fill #0

## 2018-08-19 MED FILL — ?IBUPROFEN 600 MG TABS: 600 | 30 days supply | Qty: 60 | Fill #0

## 2018-08-19 MED FILL — ?ATORVASTATIN 40MG TABLET: 40 | 30 days supply | Qty: 30 | Fill #0

## 2018-08-19 MED FILL — SERTRALINE HCL 50 MG TABLET: 50 | 30 days supply | Qty: 30 | Fill #0

## 2018-08-19 MED FILL — hydrOXYzine HCL 25 MG TABS: 25 | 20 days supply | Qty: 60 | Fill #0

## 2018-08-19 NOTE — Patient Instructions (Signed)
Sciatica ° °Sciatica is pain, weakness, tingling, or loss of feeling (numbness) along the sciatic nerve. The sciatic nerve starts in the lower back and goes down the back of each leg. Sciatica usually goes away on its own or with treatment. Sometimes, sciatica may come back (recur). °What are the causes? °This condition happens when the sciatic nerve is pinched or has pressure put on it. This may be the result of: °· A disk in between the bones of the spine bulging out too far (herniated disk). °· Changes in the spinal disks that occur with aging. °· A condition that affects a muscle in the butt. °· Extra bone growth near the sciatic nerve. °· A break (fracture) of the area between your hip bones (pelvis). °· Pregnancy. °· Tumor. This is rare. °What increases the risk? °You are more likely to develop this condition if you: °· Play sports that put pressure or stress on the spine. °· Have poor strength and ease of movement (flexibility). °· Have had a back injury in the past. °· Have had back surgery. °· Sit for long periods of time. °· Do activities that involve bending or lifting over and over again. °· Are very overweight (obese). °What are the signs or symptoms? °Symptoms can vary from mild to very bad. They may include: °· Any of these problems in the lower back, leg, hip, or butt: °? Mild tingling, loss of feeling, or dull aches. °? Burning sensations. °? Sharp pains. °· Loss of feeling in the back of the calf or the sole of the foot. °· Leg weakness. °· Very bad back pain that makes it hard to move. °These symptoms may get worse when you cough, sneeze, or laugh. They may also get worse when you sit or stand for long periods of time. °How is this treated? °This condition often gets better without any treatment. However, treatment may include: °· Changing or cutting back on physical activity when you have pain. °· Doing exercises and stretching. °· Putting ice or heat on the affected area. °· Medicines that  help: °? To relieve pain and swelling. °? To relax your muscles. °· Shots (injections) of medicines that help to relieve pain, irritation, and swelling. °· Surgery. °Follow these instructions at home: °Medicines °· Take over-the-counter and prescription medicines only as told by your doctor. °· Ask your doctor if the medicine prescribed to you: °? Requires you to avoid driving or using heavy machinery. °? Can cause trouble pooping (constipation). You may need to take these steps to prevent or treat trouble pooping: °§ Drink enough fluids to keep your pee (urine) pale yellow. °§ Take over-the-counter or prescription medicines. °§ Eat foods that are high in fiber. These include beans, whole grains, and fresh fruits and vegetables. °§ Limit foods that are high in fat and sugar. These include fried or sweet foods. °Managing pain ° °  ° °· If told, put ice on the affected area. °? Put ice in a plastic bag. °? Place a towel between your skin and the bag. °? Leave the ice on for 20 minutes, 2-3 times a day. °· If told, put heat on the affected area. Use the heat source that your doctor tells you to use, such as a moist heat pack or a heating pad. °? Place a towel between your skin and the heat source. °? Leave the heat on for 20-30 minutes. °? Remove the heat if your skin turns bright red. This is very important if you are   unable to feel pain, heat, or cold. You may have a greater risk of getting burned. °Activity ° °· Return to your normal activities as told by your doctor. Ask your doctor what activities are safe for you. °· Avoid activities that make your symptoms worse. °· Take short rests during the day. °? When you rest for a long time, do some physical activity or stretching between periods of rest. °? Avoid sitting for a long time without moving. Get up and move around at least one time each hour. °· Exercise and stretch regularly, as told by your doctor. °· Do not lift anything that is heavier than 10 lb (4.5 kg)  while you have symptoms of sciatica. °? Avoid lifting heavy things even when you do not have symptoms. °? Avoid lifting heavy things over and over. °· When you lift objects, always lift in a way that is safe for your body. To do this, you should: °? Bend your knees. °? Keep the object close to your body. °? Avoid twisting. °General instructions °· Stay at a healthy weight. °· Wear comfortable shoes that support your feet. Avoid wearing high heels. °· Avoid sleeping on a mattress that is too soft or too hard. You might have less pain if you sleep on a mattress that is firm enough to support your back. °· Keep all follow-up visits as told by your doctor. This is important. °Contact a doctor if: °· You have pain that: °? Wakes you up when you are sleeping. °? Gets worse when you lie down. °? Is worse than the pain you have had in the past. °? Lasts longer than 4 weeks. °· You lose weight without trying. °Get help right away if: °· You cannot control when you pee (urinate) or poop (have a bowel movement). °· You have weakness in any of these areas and it gets worse: °? Lower back. °? The area between your hip bones. °? Butt. °? Legs. °· You have redness or swelling of your back. °· You have a burning feeling when you pee. °Summary °· Sciatica is pain, weakness, tingling, or loss of feeling (numbness) along the sciatic nerve. °· This condition happens when the sciatic nerve is pinched or has pressure put on it. °· Sciatica can cause pain, tingling, or loss of feeling (numbness) in the lower back, legs, hips, and butt. °· Treatment often includes rest, exercise, medicines, and putting ice or heat on the affected area. °This information is not intended to replace advice given to you by your health care provider. Make sure you discuss any questions you have with your health care provider. °Document Released: 11/14/2007 Document Revised: 02/23/2018 Document Reviewed: 02/23/2018 °Elsevier Patient Education © 2020 [employer] ° °

## 2018-08-19 NOTE — Progress Notes (Signed)
Subjective:  Patient ID: Brandy [name], female    DOB: 1970-10-01  Age: 48 y.o. MRN: [id number]  CC: Back Pain   HPI Brandy [name] is a 48 year old female with a history of hypertension, hyperlipidemia anxiety and depression here for follow-up visit. He has noticed her anxiety and depression worsening the ongoing pandemic and has not been taking her hydroxyzine and Zoloft.  She also endorses insomnia. Denies suicidal ideation or intent. She complains of pain on the right side of her back which does not radiate down her right lower extremity decreases with lying down or resting and increases when she gets up to walk to the point that she has to stop and adjust herself prior to initiating movement.  Sometimes this is associated with popping which is painful and she has noticed some slight numbness and tingling.  She had an ED visit with [facility] last week but did not have anything prescribed for her. Since her last visit with me 3 months ago she has lost 6 pounds and is glad about this.  Doing well on her antihypertensive and her statin with no complaints of adverse effects.  Past Medical History:  Diagnosis Date  . Arthritis   . Disc degeneration, lumbar 2011  . Hypertension     Past Surgical History:  Procedure Laterality Date  . TUBAL LIGATION      Family History  Problem Relation Age of Onset  . Cancer Paternal Grandfather        prostate  . Hypertension Paternal Grandfather   . Breast cancer Mother 78  . Thyroid disease Mother   . Diabetes Father   . Breast cancer Maternal Aunt 56       died from cancer    No Known Allergies  Outpatient Medications Prior to Visit  Medication Sig Dispense Refill  . amLODipine (NORVASC) 2.5 MG tablet Take 1 tablet (2.5 mg total) by mouth daily. 30 tablet 6  . hydrochlorothiazide (HYDRODIURIL) 25 MG tablet Take 1 tablet (25 mg total) by mouth daily. 30 tablet 6  . Lactobacillus-Inulin ([place]) CAPS Take 2  capsules by mouth 2 (two) times daily. (Patient not taking: Reported on 03/24/2017)    . amoxicillin (AMOXIL) 875 MG tablet Take 1 tablet (875 mg total) by mouth 2 (two) times daily. (Patient not taking: Reported on 04/22/2018) 20 tablet 0  . atorvastatin (LIPITOR) 40 MG tablet Take 1 tablet (40 mg total) by mouth daily. (Patient not taking: Reported on 04/22/2018) 30 tablet 3  . benzonatate (TESSALON) 100 MG capsule Take 1-2 capsules (100-200 mg total) by mouth 3 (three) times daily as needed for cough. (Patient not taking: Reported on 04/22/2018) 40 capsule 0  . fluticasone (FLONASE) 50 MCG/ACT nasal spray Place 2 sprays into both nostrils daily. (Patient not taking: Reported on 04/22/2018) 16 g 12  . hydrOXYzine (ATARAX/VISTARIL) 25 MG tablet Take 1 tablet (25 mg total) by mouth 3 (three) times daily as needed. (Patient not taking: Reported on 04/22/2018) 60 tablet 3  . metroNIDAZOLE (METROGEL VAGINAL) 0.75 % vaginal gel Place 1 Applicatorful vaginally at bedtime. (Patient not taking: Reported on 08/19/2018) 70 g 0  . sertraline (ZOLOFT) 50 MG tablet Take 1 tablet (50 mg total) by mouth daily. (Patient not taking: Reported on 04/22/2018) 30 tablet 3  . traZODone (DESYREL) 50 MG tablet TAKE 0.5-1 TABLETS (25-50 MG TOTAL) BY MOUTH AT BEDTIME AS NEEDED FOR SLEEP. (Patient not taking: Reported on 10/16/2017) 30 tablet 0   No facility-administered medications  prior to visit.      ROS Review of Systems  Constitutional: Negative for activity change, appetite change and fatigue.  HENT: Negative for congestion, sinus pressure and sore throat.   Eyes: Negative for visual disturbance.  Respiratory: Negative for cough, chest tightness, shortness of breath and wheezing.   Cardiovascular: Negative for chest pain and palpitations.  Gastrointestinal: Positive for diarrhea. Negative for abdominal distention, abdominal pain and constipation.  Endocrine: Negative for polydipsia.  Genitourinary: Negative for dysuria and  frequency.  Musculoskeletal: Positive for back pain. Negative for arthralgias.  Skin: Negative for rash.  Neurological: Negative for tremors, light-headedness and numbness.  Hematological: Does not bruise/bleed easily.  Psychiatric/Behavioral: Negative for agitation and behavioral problems.       Positive for anxiety    Objective:  BP 111/77   Pulse 67   Temp 98.2 F (36.8 C) (Oral)   Ht _0  (1.702 m)   Wt 161 lb 9.6 oz (73.3 kg)   SpO2 97%   BMI 25.31 kg/m   BP/Weight 08/19/2018 04/22/2018 53/97/6734  Systolic BP 193 790 240  Diastolic BP 77 86 973  Wt. (Lbs) 161.6 167.6 -  BMI 25.31 26.25 -      Physical Exam Constitutional:      Appearance: She is well-developed.  Cardiovascular:     Rate and Rhythm: Normal rate.     Heart sounds: Normal heart sounds. No murmur.  Pulmonary:     Effort: Pulmonary effort is normal.     Breath sounds: Normal breath sounds. No wheezing or rales.  Chest:     Chest wall: No tenderness.  Abdominal:     General: Bowel sounds are normal. There is no distension.     Palpations: Abdomen is soft. There is no mass.     Tenderness: There is no abdominal tenderness.  Musculoskeletal: Normal range of motion.     Comments: Slight tenderness on palpation of right side of lumbar spine; positive straight leg raise  Neurological:     Mental Status: She is alert and oriented to person, place, and time.     CMP Latest Ref Rng & Units 06/24/2017 06/26/2016 05/14/2016  Glucose 65 - 99 mg/dL 88 80 105(H)  BUN 6 - 24 mg/dL _1 Creatinine 0.57 - 1.00 mg/dL 0.95 1.01(H) 0.93  Sodium 134 - 144 mmol/L 144 142 139  Potassium 3.5 - 5.2 mmol/L 4.0 4.1 4.1  Chloride 96 - 106 mmol/L 102 99 103  CO2 20 - 29 mmol/L _2 Calcium 8.7 - 10.2 mg/dL 9.8 9.8 9.3  Total Protein 6.0 - 8.5 g/dL 7.8 - -  Total Bilirubin 0.0 - 1.2 mg/dL 0.3 - -  Alkaline Phos 39 - 117 IU/L 85 - -  AST 0 - 40 IU/L 18 - -  ALT 0 - 32 IU/L 22 - -    Lipid Panel     Component  Value Date/Time   CHOL 160 09/10/2017 0959   CHOL 314 (H) 06/24/2017 0922   TRIG 132 09/10/2017 0959   HDL 38 (L) 09/10/2017 0959   HDL 52 06/24/2017 0922   CHOLHDL 4.2 09/10/2017 0959   VLDL 26 09/10/2017 0959   LDLCALC 96 09/10/2017 0959   LDLCALC 218 (H) 06/24/2017 0922    CBC    Component Value Date/Time   WBC 7.8 06/26/2016 1623   WBC 6.2 05/14/2016 1418   RBC 5.22 06/26/2016 1623   RBC 5.06 05/14/2016 1418   HGB 15.8 06/26/2016 1623  HCT 45.7 06/26/2016 1623   PLT 265 06/26/2016 1623   MCV 88 06/26/2016 1623   MCH 30.3 06/26/2016 1623   MCH 30.6 05/14/2016 1418   MCHC 34.6 06/26/2016 1623   MCHC 34.1 05/14/2016 1418   RDW 14.0 06/26/2016 1623   LYMPHSABS 1.7 06/26/2016 1623   EOSABS 0.0 06/26/2016 1623   BASOSABS 0.0 06/26/2016 1623    Lab Results  Component Value Date   HGBA1C 5.4 09/10/2017    Assessment & Plan:   1. Anxiety and depression Uncontrolled She has been out of her medications which I have refilled and this has also worsened with the ongoing COVID-19 pandemic - hydrOXYzine (ATARAX/VISTARIL) 25 MG tablet; Take 1 tablet (25 mg total) by mouth 3 (three) times daily as needed.  Dispense: 60 tablet; Refill: 3 - sertraline (ZOLOFT) 50 MG tablet; Take 1 tablet (50 mg total) by mouth daily.  Dispense: 30 tablet; Refill: 6  2. Essential hypertension Controlled Counseled on blood pressure goal of less than 130/80, low-sodium, DASH diet, medication compliance, 150 minutes of moderate intensity exercise per week. Discussed medication compliance, adverse effects. - CMP14+EGFR - Lipid panel - amLODipine (NORVASC) 2.5 MG tablet; Take 1 tablet (2.5 mg total) by mouth daily.  Dispense: 30 tablet; Refill: 6 - hydrochlorothiazide (HYDRODIURIL) 25 MG tablet; Take 1 tablet (25 mg total) by mouth daily.  Dispense: 30 tablet; Refill: 6  3. Pure hypercholesterolemia Controlled Low-cholesterol diet - atorvastatin (LIPITOR) 40 MG tablet; Take 1 tablet (40 mg total)  by mouth daily.  Dispense: 30 tablet; Refill: 6  4. Sciatica of right side We have discussed low back exercises and if symptoms persist on current regimen, I will refer for physical therapy - cyclobenzaprine (FLEXERIL) 10 MG tablet; Take 1 tablet (10 mg total) by mouth 2 (two) times daily as needed for muscle spasms.  Dispense: 60 tablet; Refill: 1 - ibuprofen (ADVIL) 600 MG tablet; Take 1 tablet (600 mg total) by mouth 2 (two) times daily as needed.  Dispense: 60 tablet; Refill: 0  5. Irritable bowel syndrome with diarrhea - dicyclomine (BENTYL) 10 MG capsule; Take 1 capsule (10 mg total) by mouth 3 (three) times daily before meals.  Dispense: 90 capsule; Refill: 1   Meds ordered this encounter  Medications  . hydrOXYzine (ATARAX/VISTARIL) 25 MG tablet    Sig: Take 1 tablet (25 mg total) by mouth 3 (three) times daily as needed.    Dispense:  60 tablet    Refill:  3  . sertraline (ZOLOFT) 50 MG tablet    Sig: Take 1 tablet (50 mg total) by mouth daily.    Dispense:  30 tablet    Refill:  6    Discontinue prozac  . amLODipine (NORVASC) 2.5 MG tablet    Sig: Take 1 tablet (2.5 mg total) by mouth daily.    Dispense:  30 tablet    Refill:  6  . hydrochlorothiazide (HYDRODIURIL) 25 MG tablet    Sig: Take 1 tablet (25 mg total) by mouth daily.    Dispense:  30 tablet    Refill:  6  . atorvastatin (LIPITOR) 40 MG tablet    Sig: Take 1 tablet (40 mg total) by mouth daily.    Dispense:  30 tablet    Refill:  6  . dicyclomine (BENTYL) 10 MG capsule    Sig: Take 1 capsule (10 mg total) by mouth 3 (three) times daily before meals.    Dispense:  90 capsule    Refill:  1  . cyclobenzaprine (FLEXERIL) 10 MG tablet    Sig: Take 1 tablet (10 mg total) by mouth 2 (two) times daily as needed for muscle spasms.    Dispense:  60 tablet    Refill:  1  . ibuprofen (ADVIL) 600 MG tablet    Sig: Take 1 tablet (600 mg total) by mouth 2 (two) times daily as needed.    Dispense:  60 tablet     Refill:  0    Follow-up: Return in about 3 months (around 11/19/2018) for medical conditions.       Charlott Rakes, MD, FAAFP. [facility] [place], [place]   08/19/2018, 10:46 AM

## 2018-08-19 NOTE — Progress Notes (Signed)
Pain in lower right side of back.

## 2018-08-20 ENCOUNTER — Other Ambulatory Visit: Payer: Self-pay | Admitting: Family Medicine

## 2018-08-20 LAB — CMP14+EGFR
ALT: 17 IU/L (ref 0–32)
AST: 15 IU/L (ref 0–40)
Albumin/Globulin Ratio: 1.6 (ref 1.2–2.2)
Albumin: 4.7 g/dL (ref 3.8–4.8)
Alkaline Phosphatase: 72 IU/L (ref 39–117)
BUN/Creatinine Ratio: 16 (ref 9–23)
BUN: 14 mg/dL (ref 6–24)
Bilirubin Total: 0.4 mg/dL (ref 0.0–1.2)
CO2: 24 mmol/L (ref 20–29)
Calcium: 9.6 mg/dL (ref 8.7–10.2)
Chloride: 99 mmol/L (ref 96–106)
Creatinine, Ser: 0.88 mg/dL (ref 0.57–1.00)
GFR calc Af Amer: 90 mL/min/{1.73_m2} (ref >59)
GFR calc non Af Amer: 78 mL/min/{1.73_m2} (ref >59)
Globulin, Total: 2.9 g/dL (ref 1.5–4.5)
Glucose: 83 mg/dL (ref 65–99)
Potassium: 3.2 mmol/L — ABNORMAL LOW (ref 3.5–5.2)
Sodium: 143 mmol/L (ref 134–144)
Total Protein: 7.6 g/dL (ref 6.0–8.5)

## 2018-08-20 LAB — LIPID PANEL
Chol/HDL Ratio: 5.1 ratio — ABNORMAL HIGH (ref 0.0–4.4)
Cholesterol, Total: 253 mg/dL — ABNORMAL HIGH (ref 100–199)
HDL: 50 mg/dL (ref >39)
LDL Calculated: 162 mg/dL — ABNORMAL HIGH (ref 0–99)
Triglycerides: 204 mg/dL — ABNORMAL HIGH (ref 0–149)
VLDL Cholesterol Cal: 41 mg/dL — ABNORMAL HIGH (ref 5–40)

## 2018-08-20 MED ORDER — POTASSIUM CHLORIDE ER 10 MEQ PO TBCR: 10.0000 meq | EXTENDED_RELEASE_TABLET | Freq: Every day | ORAL | 3 refills | Status: DC

## 2018-08-20 MED FILL — POTASSIUM CHLORIDE ER 10 ME: 10 | 30 days supply | Qty: 30 | Fill #0

## 2018-08-31 MED FILL — ?HYDROCHLOROTHIAZIDE 25MG T: 25 | 30 days supply | Qty: 30 | Fill #0

## 2018-08-31 MED FILL — ?AMLODIPINE BESYLATE 2.5MG.: 2.5 | 30 days supply | Qty: 30 | Fill #0

## 2018-11-02 MED FILL — ?HYDROCHLOROTHIAZIDE 25MG T: 25 | 30 days supply | Qty: 30 | Fill #1

## 2018-11-02 MED FILL — AMLODIPINE BESYLATE 2.5 MG: 2.5 | 30 days supply | Qty: 30 | Fill #1

## 2018-11-23 ENCOUNTER — Other Ambulatory Visit (HOSPITAL_COMMUNITY): Payer: Self-pay | Admitting: *Deleted

## 2018-11-23 DIAGNOSIS — Z1231 Encounter for screening mammogram for malignant neoplasm of breast: Secondary | ICD-10-CM

## 2018-11-24 ENCOUNTER — Ambulatory Visit: Payer: No Typology Code available for payment source | Admitting: Family Medicine

## 2018-11-24 ENCOUNTER — Ambulatory Visit: Payer: Self-pay | Attending: Family Medicine | Admitting: Family Medicine

## 2018-11-24 ENCOUNTER — Other Ambulatory Visit: Payer: Self-pay

## 2018-11-24 ENCOUNTER — Encounter: Payer: Self-pay | Admitting: Family Medicine

## 2018-11-24 DIAGNOSIS — K580 Irritable bowel syndrome with diarrhea: Secondary | ICD-10-CM

## 2018-11-24 DIAGNOSIS — E7800 Pure hypercholesterolemia, unspecified: Secondary | ICD-10-CM

## 2018-11-24 DIAGNOSIS — F419 Anxiety disorder, unspecified: Secondary | ICD-10-CM

## 2018-11-24 DIAGNOSIS — M5431 Sciatica, right side: Secondary | ICD-10-CM

## 2018-11-24 DIAGNOSIS — F329 Major depressive disorder, single episode, unspecified: Secondary | ICD-10-CM

## 2018-11-24 DIAGNOSIS — I10 Essential (primary) hypertension: Secondary | ICD-10-CM

## 2018-11-24 MED ORDER — AMLODIPINE BESYLATE 2.5 MG PO TABS: 2.5000 mg | ORAL_TABLET | Freq: Every day | ORAL | 6 refills | Status: DC

## 2018-11-24 MED ORDER — CYCLOBENZAPRINE HCL 10 MG PO TABS: 10.0000 mg | ORAL_TABLET | Freq: Two times a day (BID) | ORAL | 2 refills | Status: DC | PRN

## 2018-11-24 MED ORDER — ATORVASTATIN CALCIUM 40 MG PO TABS: 40.0000 mg | ORAL_TABLET | Freq: Every day | ORAL | 6 refills | Status: DC

## 2018-11-24 MED ORDER — SERTRALINE HCL 50 MG PO TABS: 50.0000 mg | ORAL_TABLET | Freq: Every day | ORAL | 6 refills | Status: DC

## 2018-11-24 MED ORDER — POTASSIUM CHLORIDE ER 10 MEQ PO TBCR: 10.0000 meq | EXTENDED_RELEASE_TABLET | Freq: Every day | ORAL | 6 refills | Status: DC

## 2018-11-24 MED ORDER — DICYCLOMINE HCL 10 MG PO CAPS: 10.0000 mg | ORAL_CAPSULE | Freq: Three times a day (TID) | ORAL | 3 refills | Status: DC

## 2018-11-24 MED ORDER — IBUPROFEN 600 MG PO TABS: 600.0000 mg | ORAL_TABLET | Freq: Two times a day (BID) | ORAL | 3 refills | Status: DC | PRN

## 2018-11-24 MED ORDER — HYDROXYZINE HCL 25 MG PO TABS: 25.0000 mg | ORAL_TABLET | Freq: Three times a day (TID) | ORAL | 3 refills | Status: DC | PRN

## 2018-11-24 MED ORDER — HYDROCHLOROTHIAZIDE 25 MG PO TABS: 25.0000 mg | ORAL_TABLET | Freq: Every day | ORAL | 6 refills | Status: DC

## 2018-11-24 MED FILL — ?IBUPROFEN 600 MG TABS: 600 | 30 days supply | Qty: 60 | Fill #0

## 2018-11-24 MED FILL — DICYCLOMINE 10 MG CAPSULE: 10 | 30 days supply | Qty: 90 | Fill #0

## 2018-11-24 MED FILL — SERTRALINE HCL 50 MG TABS: 50 | 30 days supply | Qty: 30 | Fill #0

## 2018-11-24 MED FILL — POTASSIUM CHLORIDE ER 10 ME: 10 | 30 days supply | Qty: 30 | Fill #0

## 2018-11-24 MED FILL — hydrOXYzine HCL 25 MG TABS: 25 | 20 days supply | Qty: 60 | Fill #0

## 2018-11-24 MED FILL — CYCLOBENZAPRINE 10 MG TAB: 10 | 30 days supply | Qty: 60 | Fill #0

## 2018-11-24 MED FILL — ?ATORVASTATIN 40MG TABLET: 40 | 30 days supply | Qty: 30 | Fill #0

## 2018-11-24 NOTE — Progress Notes (Signed)
Subjective:  Patient ID: Brandy [name], female    DOB: 1970/06/26  Age: 48 y.o. MRN: [id number]  CC: Back Pain   HPI Brandy [name]  is a 48 year old female with a history of hypertension, hyperlipidemia anxiety and depression here for follow-up visit. Her anxiety and depression have improved and are now stable; she now has a job and is watching to see how it goes.  Compliant with hydroxyzine and Zoloft. Her right-sided low back pain with sciatica is uncontrolled rated as moderate to severe and prevents her from sleeping, is exacerbated by walking greater than 30 minutes and radiates to her right butt cheek with associated tingling in her right thigh.  Flexeril and ibuprofen provides minimal relief.  She denies falling, loss of sphincteric function. Compliant with her antihypertensive. She has not been taking her Lipitor due to recommendations to hold off on medication and work on lifestyle management after her last labs had revealed elevated cholesterol with a 10-year ASCVD risk of less than 5%.  Past Medical History:  Diagnosis Date  . Arthritis   . Disc degeneration, lumbar 2011  . Hypertension     Past Surgical History:  Procedure Laterality Date  . TUBAL LIGATION      Family History  Problem Relation Age of Onset  . Cancer Paternal Grandfather        prostate  . Hypertension Paternal Grandfather   . Breast cancer Mother 73  . Thyroid disease Mother   . Diabetes Father   . Breast cancer Maternal Aunt 17       died from cancer    No Known Allergies  Outpatient Medications Prior to Visit  Medication Sig Dispense Refill  . amLODipine (NORVASC) 2.5 MG tablet Take 1 tablet (2.5 mg total) by mouth daily. 30 tablet 6  . cyclobenzaprine (FLEXERIL) 10 MG tablet Take 1 tablet (10 mg total) by mouth 2 (two) times daily as needed for muscle spasms. 60 tablet 1  . dicyclomine (BENTYL) 10 MG capsule Take 1 capsule (10 mg total) by mouth 3 (three) times daily before meals. 90  capsule 1  . hydrochlorothiazide (HYDRODIURIL) 25 MG tablet Take 1 tablet (25 mg total) by mouth daily. 30 tablet 6  . hydrOXYzine (ATARAX/VISTARIL) 25 MG tablet Take 1 tablet (25 mg total) by mouth 3 (three) times daily as needed. 60 tablet 3  . ibuprofen (ADVIL) 600 MG tablet Take 1 tablet (600 mg total) by mouth 2 (two) times daily as needed. 60 tablet 0  . potassium chloride (KLOR-CON 10) 10 MEQ tablet Take 1 tablet (10 mEq total) by mouth daily. 30 tablet 3  . sertraline (ZOLOFT) 50 MG tablet Take 1 tablet (50 mg total) by mouth daily. 30 tablet 6  . Lactobacillus-Inulin ([place]) CAPS Take 2 capsules by mouth 2 (two) times daily. (Patient not taking: Reported on 03/24/2017)    . atorvastatin (LIPITOR) 40 MG tablet Take 1 tablet (40 mg total) by mouth daily. (Patient not taking: Reported on 11/24/2018) 30 tablet 6   No facility-administered medications prior to visit.      ROS Review of Systems  Constitutional: Negative for activity change, appetite change and fatigue.  HENT: Negative for congestion, sinus pressure and sore throat.   Eyes: Negative for visual disturbance.  Respiratory: Negative for cough, chest tightness, shortness of breath and wheezing.   Cardiovascular: Negative for chest pain and palpitations.  Gastrointestinal: Negative for abdominal distention, abdominal pain and constipation.  Endocrine: Negative for polydipsia.  Genitourinary:  Negative for dysuria and frequency.  Musculoskeletal: Negative for arthralgias and back pain.  Skin: Negative for rash.  Neurological: Negative for tremors, light-headedness and numbness.  Hematological: Does not bruise/bleed easily.  Psychiatric/Behavioral: Negative for agitation and behavioral problems.    Objective:  BP 126/86   Pulse 76   Temp 98 F (36.7 C) (Oral)   Ht 5\' 7"  ([id number] m)   Wt 168 lb 3.2 oz (76.3 kg)   SpO2 98%   BMI 26.34 kg/m   BP/Weight 11/24/2018 [id number]  Systolic BP [id number]  [id number]  Diastolic BP 86 77 86  Wt. (Lbs) 168.2 161.6 167.6  BMI 26.34 25.31 26.25      Physical Exam Constitutional:      Appearance: She is well-developed.  Neck:     Vascular: No JVD.  Cardiovascular:     Rate and Rhythm: Normal rate.     Heart sounds: Normal heart sounds. No murmur.  Pulmonary:     Effort: Pulmonary effort is normal.     Breath sounds: Normal breath sounds. No wheezing or rales.  Chest:     Chest wall: No tenderness.  Abdominal:     General: Bowel sounds are normal. There is no distension.     Palpations: Abdomen is soft. There is no mass.     Tenderness: There is no abdominal tenderness.  Musculoskeletal:     Right lower leg: No edema.     Left lower leg: No edema.     Comments: Slight tenderness to palpation on right lower back; positive straight leg raise on the right but negative on the left  Neurological:     Mental Status: She is alert and oriented to person, place, and time.     Gait: Gait normal.  Psychiatric:        Mood and Affect: Mood normal.     CMP Latest Ref Rng & Units 08/19/2018 06/24/2017 06/26/2016  Glucose 65 - 99 mg/dL 83 88 80  BUN 6 - 24 mg/dL 14 14 16   Creatinine 0.57 - 1.00 mg/dL 0.88 0.95 1.01(H)  Sodium 134 - 144 mmol/L 143 144 142  Potassium 3.5 - 5.2 mmol/L 3.2(L) 4.0 4.1  Chloride 96 - 106 mmol/L 99 102 99  CO2 20 - 29 mmol/L 24 26 28   Calcium 8.7 - 10.2 mg/dL 9.6 9.8 9.8  Total Protein 6.0 - 8.5 g/dL 7.6 7.8 -  Total Bilirubin 0.0 - 1.2 mg/dL 0.4 0.3 -  Alkaline Phos 39 - 117 IU/L 72 85 -  AST 0 - 40 IU/L 15 18 -  ALT 0 - 32 IU/L 17 22 -    Lipid Panel     Component Value Date/Time   CHOL 253 (H) 08/19/2018 1053   TRIG 204 (H) 08/19/2018 1053   HDL 50 08/19/2018 1053   CHOLHDL 5.1 (H) 08/19/2018 1053   CHOLHDL 4.2 09/10/2017 0959   VLDL 26 09/10/2017 0959   LDLCALC 162 (H) 08/19/2018 1053    CBC    Component Value Date/Time   WBC 7.8 06/26/2016 1623   WBC 6.2 05/14/2016 1418   RBC 5.22 06/26/2016 1623    RBC 5.06 05/14/2016 1418   HGB 15.8 06/26/2016 1623   HCT 45.7 06/26/2016 1623   PLT 265 06/26/2016 1623   MCV 88 06/26/2016 1623   MCH 30.3 06/26/2016 1623   MCH 30.6 05/14/2016 1418   MCHC 34.6 06/26/2016 1623   MCHC 34.1 05/14/2016 1418   RDW 14.0 06/26/2016 1623   LYMPHSABS  1.7 06/26/2016 1623   EOSABS 0.0 06/26/2016 1623   BASOSABS 0.0 06/26/2016 1623    Lab Results  Component Value Date   HGBA1C 5.4 09/10/2017   Depression screen PHQ 2/9 11/24/2018  Decreased Interest 1  Down, Depressed, Hopeless 2  PHQ - 2 Score 3  Altered sleeping 3  Tired, decreased energy 2  Change in appetite 3  Feeling bad or failure about yourself  1  Trouble concentrating 2  Moving slowly or fidgety/restless 0  Suicidal thoughts 0  PHQ-9 Score 14  Some recent data might be hidden    The 10-year ASCVD risk score Mikey Bussing DC Jr., et al., 2013) is: 4%   Values used to calculate the score:     Age: 87 years     Sex: Female     Is Non-Hispanic African American: Yes     Diabetic: No     Tobacco smoker: No     Systolic Blood Pressure: 123XX123 mmHg     Is BP treated: Yes     HDL Cholesterol: 50 mg/dL     Total Cholesterol: 253 mg/dL   Assessment & Plan:   1. Sciatica of right side Uncontrolled Will refer for PT Continue Flexeril and ibuprofen - cyclobenzaprine (FLEXERIL) 10 MG tablet; Take 1 tablet (10 mg total) by mouth 2 (two) times daily as needed for muscle spasms.  Dispense: 60 tablet; Refill: 2 - Ambulatory referral to Physical Therapy - ibuprofen (ADVIL) 600 MG tablet; Take 1 tablet (600 mg total) by mouth 2 (two) times daily as needed.  Dispense: 60 tablet; Refill: 3 - DG Lumbar Spine Complete; Future  2. Essential hypertension Controlled Counseled on blood pressure goal of less than 130/80, low-sodium, DASH diet, medication compliance, 150 minutes of moderate intensity exercise per week. Discussed medication compliance, adverse effects. - amLODipine (NORVASC) 2.5 MG tablet; Take 1  tablet (2.5 mg total) by mouth daily.  Dispense: 30 tablet; Refill: 6 - hydrochlorothiazide (HYDRODIURIL) 25 MG tablet; Take 1 tablet (25 mg total) by mouth daily.  Dispense: 30 tablet; Refill: 6  3. Pure hypercholesterolemia Uncontrolled 10-year ASCVD risk of 4% She has been off Lipitor for the last 3 months and has been advised to resume it We will check lipid panel at next visit - atorvastatin (LIPITOR) 40 MG tablet; Take 1 tablet (40 mg total) by mouth daily.  Dispense: 30 tablet; Refill: 6  4. Irritable bowel syndrome with diarrhea Stable - dicyclomine (BENTYL) 10 MG capsule; Take 1 capsule (10 mg total) by mouth 3 (three) times daily before meals.  Dispense: 90 capsule; Refill: 3  5. Anxiety and depression Controlled - hydrOXYzine (ATARAX/VISTARIL) 25 MG tablet; Take 1 tablet (25 mg total) by mouth 3 (three) times daily as needed.  Dispense: 60 tablet; Refill: 3 - sertraline (ZOLOFT) 50 MG tablet; Take 1 tablet (50 mg total) by mouth daily.  Dispense: 30 tablet; Refill: 6   Meds ordered this encounter  Medications  . cyclobenzaprine (FLEXERIL) 10 MG tablet    Sig: Take 1 tablet (10 mg total) by mouth 2 (two) times daily as needed for muscle spasms.    Dispense:  60 tablet    Refill:  2  . amLODipine (NORVASC) 2.5 MG tablet    Sig: Take 1 tablet (2.5 mg total) by mouth daily.    Dispense:  30 tablet    Refill:  6  . atorvastatin (LIPITOR) 40 MG tablet    Sig: Take 1 tablet (40 mg total) by mouth daily.  Dispense:  30 tablet    Refill:  6  . dicyclomine (BENTYL) 10 MG capsule    Sig: Take 1 capsule (10 mg total) by mouth 3 (three) times daily before meals.    Dispense:  90 capsule    Refill:  3  . hydrochlorothiazide (HYDRODIURIL) 25 MG tablet    Sig: Take 1 tablet (25 mg total) by mouth daily.    Dispense:  30 tablet    Refill:  6  . hydrOXYzine (ATARAX/VISTARIL) 25 MG tablet    Sig: Take 1 tablet (25 mg total) by mouth 3 (three) times daily as needed.    Dispense:   60 tablet    Refill:  3  . ibuprofen (ADVIL) 600 MG tablet    Sig: Take 1 tablet (600 mg total) by mouth 2 (two) times daily as needed.    Dispense:  60 tablet    Refill:  3  . potassium chloride (KLOR-CON 10) 10 MEQ tablet    Sig: Take 1 tablet (10 mEq total) by mouth daily.    Dispense:  30 tablet    Refill:  6  . sertraline (ZOLOFT) 50 MG tablet    Sig: Take 1 tablet (50 mg total) by mouth daily.    Dispense:  30 tablet    Refill:  6    Follow-up: Return in about 6 months (around 05/25/2019) for Medical conditions.       Charlott Rakes, MD, FAAFP. [facility] [place], [place]   11/24/2018, 11:40 AM

## 2018-11-24 NOTE — Patient Instructions (Signed)
Sciatica ° °Sciatica is pain, weakness, tingling, or loss of feeling (numbness) along the sciatic nerve. The sciatic nerve starts in the lower back and goes down the back of each leg. Sciatica usually goes away on its own or with treatment. Sometimes, sciatica may come back (recur). °What are the causes? °This condition happens when the sciatic nerve is pinched or has pressure put on it. This may be the result of: °· A disk in between the bones of the spine bulging out too far (herniated disk). °· Changes in the spinal disks that occur with aging. °· A condition that affects a muscle in the butt. °· Extra bone growth near the sciatic nerve. °· A break (fracture) of the area between your hip bones (pelvis). °· Pregnancy. °· Tumor. This is rare. °What increases the risk? °You are more likely to develop this condition if you: °· Play sports that put pressure or stress on the spine. °· Have poor strength and ease of movement (flexibility). °· Have had a back injury in the past. °· Have had back surgery. °· Sit for long periods of time. °· Do activities that involve bending or lifting over and over again. °· Are very overweight (obese). °What are the signs or symptoms? °Symptoms can vary from mild to very bad. They may include: °· Any of these problems in the lower back, leg, hip, or butt: °? Mild tingling, loss of feeling, or dull aches. °? Burning sensations. °? Sharp pains. °· Loss of feeling in the back of the calf or the sole of the foot. °· Leg weakness. °· Very bad back pain that makes it hard to move. °These symptoms may get worse when you cough, sneeze, or laugh. They may also get worse when you sit or stand for long periods of time. °How is this treated? °This condition often gets better without any treatment. However, treatment may include: °· Changing or cutting back on physical activity when you have pain. °· Doing exercises and stretching. °· Putting ice or heat on the affected area. °· Medicines that  help: °? To relieve pain and swelling. °? To relax your muscles. °· Shots (injections) of medicines that help to relieve pain, irritation, and swelling. °· Surgery. °Follow these instructions at home: °Medicines °· Take over-the-counter and prescription medicines only as told by your doctor. °· Ask your doctor if the medicine prescribed to you: °? Requires you to avoid driving or using heavy machinery. °? Can cause trouble pooping (constipation). You may need to take these steps to prevent or treat trouble pooping: °§ Drink enough fluids to keep your pee (urine) pale yellow. °§ Take over-the-counter or prescription medicines. °§ Eat foods that are high in fiber. These include beans, whole grains, and fresh fruits and vegetables. °§ Limit foods that are high in fat and sugar. These include fried or sweet foods. °Managing pain ° °  ° °· If told, put ice on the affected area. °? Put ice in a plastic bag. °? Place a towel between your skin and the bag. °? Leave the ice on for 20 minutes, 2-3 times a day. °· If told, put heat on the affected area. Use the heat source that your doctor tells you to use, such as a moist heat pack or a heating pad. °? Place a towel between your skin and the heat source. °? Leave the heat on for 20-30 minutes. °? Remove the heat if your skin turns bright red. This is very important if you are   unable to feel pain, heat, or cold. You may have a greater risk of getting burned. °Activity ° °· Return to your normal activities as told by your doctor. Ask your doctor what activities are safe for you. °· Avoid activities that make your symptoms worse. °· Take short rests during the day. °? When you rest for a long time, do some physical activity or stretching between periods of rest. °? Avoid sitting for a long time without moving. Get up and move around at least one time each hour. °· Exercise and stretch regularly, as told by your doctor. °· Do not lift anything that is heavier than 10 lb (4.5 kg)  while you have symptoms of sciatica. °? Avoid lifting heavy things even when you do not have symptoms. °? Avoid lifting heavy things over and over. °· When you lift objects, always lift in a way that is safe for your body. To do this, you should: °? Bend your knees. °? Keep the object close to your body. °? Avoid twisting. °General instructions °· Stay at a healthy weight. °· Wear comfortable shoes that support your feet. Avoid wearing high heels. °· Avoid sleeping on a mattress that is too soft or too hard. You might have less pain if you sleep on a mattress that is firm enough to support your back. °· Keep all follow-up visits as told by your doctor. This is important. °Contact a doctor if: °· You have pain that: °? Wakes you up when you are sleeping. °? Gets worse when you lie down. °? Is worse than the pain you have had in the past. °? Lasts longer than 4 weeks. °· You lose weight without trying. °Get help right away if: °· You cannot control when you pee (urinate) or poop (have a bowel movement). °· You have weakness in any of these areas and it gets worse: °? Lower back. °? The area between your hip bones. °? Butt. °? Legs. °· You have redness or swelling of your back. °· You have a burning feeling when you pee. °Summary °· Sciatica is pain, weakness, tingling, or loss of feeling (numbness) along the sciatic nerve. °· This condition happens when the sciatic nerve is pinched or has pressure put on it. °· Sciatica can cause pain, tingling, or loss of feeling (numbness) in the lower back, legs, hips, and butt. °· Treatment often includes rest, exercise, medicines, and putting ice or heat on the affected area. °This information is not intended to replace advice given to you by your health care provider. Make sure you discuss any questions you have with your health care provider. °Document Released: 11/14/2007 Document Revised: 02/23/2018 Document Reviewed: 02/23/2018 °Elsevier Patient Education © 2020 [employer] ° °

## 2018-11-25 ENCOUNTER — Encounter: Payer: Self-pay | Admitting: Family Medicine

## 2018-11-27 ENCOUNTER — Other Ambulatory Visit: Payer: Self-pay

## 2018-11-27 ENCOUNTER — Ambulatory Visit
Admission: RE | Admit: 2018-11-27 | Discharge: 2018-11-27 | Disposition: A | Discharge status: Home / Self Care | Payer: Self-pay | Source: Ambulatory Visit | Attending: Family Medicine | Admitting: Family Medicine

## 2018-11-27 DIAGNOSIS — M5431 Sciatica, right side: Secondary | ICD-10-CM

## 2018-12-09 ENCOUNTER — Ambulatory Visit: Payer: Self-pay | Attending: Family Medicine | Admitting: Physical Therapy

## 2018-12-09 ENCOUNTER — Encounter: Payer: Self-pay | Admitting: Physical Therapy

## 2018-12-09 ENCOUNTER — Other Ambulatory Visit: Payer: Self-pay

## 2018-12-09 DIAGNOSIS — M545 Low back pain, unspecified: Secondary | ICD-10-CM

## 2018-12-09 DIAGNOSIS — G8929 Other chronic pain: Secondary | ICD-10-CM | POA: Insufficient documentation

## 2018-12-09 NOTE — Therapy (Signed)
[place], [place], [address] Phone: [phone]   Fax:  [phone]  Physical Therapy Evaluation  Patient Details  Name: Brandy [name] MRN: [id number] Date of Birth: [birth year] Referring Provider (PT): Charlott Rakes, MD   Encounter Date: 12/09/2018  PT End of Session - 12/09/18 1032    Visit Number  1    Number of Visits  12    Date for PT Re-Evaluation  01/20/19    Authorization Type  [place]    PT Start Time  0930    PT Stop Time  1010    PT Time Calculation (min)  40 min    Activity Tolerance  Patient limited by pain    Behavior During Therapy  [facility] for tasks assessed/performed;Anxious       Past Medical History:  Diagnosis Date  . Arthritis   . Disc degeneration, lumbar 2011  . Hypertension     Past Surgical History:  Procedure Laterality Date  . TUBAL LIGATION      There were no vitals filed for this visit.   Subjective Assessment - 12/09/18 0936    Subjective  Patient reports low back pain for about the past 5-6 years now. Pain has been mild, but over the last year has started to get severe. She describes the pain as her right lower back and hip area as an aching, pulling, and throbbing, and she also feels/hears a pop in the right lower back when she turns. When the pain is severe, it will radiate into the right buttock. 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 She reports that when she is sleeping she has trouble turning so she has to stay in one position.    Pertinent History  Depression, Anxiety    Limitations  Standing;Lifting;Walking;Sitting;House hold activities    How long can you sit comfortably?  30-40 minutes    How long can you stand comfortably?  20 minutes    How long can you walk comfortably?  20 minutes    Diagnostic tests  X-ray - mild degenerative changes at L5/S1     Patient Stated Goals  Patient reports she wants to be able to walk, perform household work, and move better with less pain and limitation    Currently in Pain?  Yes    Pain Score  7     Pain Location  Back    Pain Orientation  Lower;Right    Pain Descriptors / Indicators  Aching;Tightness;Throbbing    Pain Type  Chronic pain    Pain Radiating Towards  right buttock when severe    Pain Onset  More than a month ago    Pain Frequency  Constant    Aggravating Factors   Standing, bending, rotating, walking,    Pain Relieving Factors  Medication, TENS, heat, lying on left side    Effect of Pain on Daily Activities  Patient reports she is limited in any housework, she is unable to run, she is limited in how long she can sit and stand    Multiple Pain Sites  No         [facility] PT Assessment - 12/09/18 0001      Assessment   Medical Diagnosis  Chronic low back pain    Referring Provider (PT)  Charlott Rakes, MD    Onset  Date/Surgical Date  --   5-6 years ago, worsened over past year   Next MD Visit  Not scheduled    Prior Therapy  None      Precautions   Precautions  None      Restrictions   Weight Bearing Restrictions  No      Balance Screen   Has the patient fallen in the past 6 months  No    Has the patient had a decrease in activity level because of a fear of falling?   No    Is the patient reluctant to leave their home because of a fear of falling?   No      Home Film/video editor residence      Prior Function   Level of Independence  Independent    Vocation  Full time employment    Vocation Requirements  Patient reports she sits the majority of the day      Cognition   Overall Cognitive Status  Within Functional Limits for tasks assessed      Observation/Other Assessments   Observations  Patient appears hesitant to move, guarded    Focus on Therapeutic Outcomes (FOTO)   64% limited      Sensation   Light Touch  Appears Intact      ROM /  Strength   AROM / PROM / Strength  AROM;Strength      AROM   AROM Assessment Site  Lumbar    Lumbar Flexion  WFL - pain on right lower back coming up    Lumbar Extension  50% limited - pain right lower back    Lumbar - Right Side Bend  25% limited - pain and popping on right side    Lumbar - Left Side Bend  25% limited - pulling on right side    Lumbar - Right Rotation  25% limited - pain right lower back    Lumbar - Left Rotation  25% limited - pain right lower back      Strength   Overall Strength Comments  Unable to assess seconday to pain      Special Tests    Special Tests  Lumbar    Lumbar Tests  Straight Leg Raise      Straight Leg Raise   Findings  Positive    Side   Right    Comment  Patient reported severe right lower back pain with <20 deg SLR, pain elevated to 9/10 and patient required MHP x10 min to reduce pain level.       Transfers   Transfers  Independent with all Transfers   patient performs transfers slowly and cautiously               Objective measurements completed on examination: See above findings.      [facility] PT Treatment/Exercise - 12/09/18 0001      Exercises   Exercises  Lumbar      Lumbar Exercises: Stretches   Single Knee to Chest Stretch  Limitations    Single Knee to Chest Stretch Limitations  Trialed bilat but patient reported increased right sided low back pain    Lower Trunk Rotation  Limitations    Lower Trunk Rotation Limitations  Trialed but patient reported increased right sided low back pain      Lumbar Exercises: Seated   Other Seated Lumbar Exercises  Seated pelvic tilts x20      Modalities   Modalities  Moist  Heat      Moist Heat Therapy   Number Minutes Moist Heat  10 Minutes    Moist Heat Location  Lumbar Spine   Right, patient position sidelying on left            PT Education - 12/09/18 1031    Education Details  Limited exam findings, HEP, POC, activity modification and pain control  modalities    Person(s) Educated  Patient    Methods  Explanation;Demonstration;Verbal cues;Handout    Comprehension  Verbalized understanding;Verbal cues required;Need further instruction       PT Short Term Goals - 12/09/18 1046      PT SHORT TERM GOAL #1   Title  Patient will be independent in HEP in order to maintain progress in physical therapy.    Time  3    Period  Weeks    Status  New    Target Date  12/30/18      PT SHORT TERM GOAL #2   Title  Patient will report report < or 5/10 pain right lower back with standing and walking to improve functional mobility.    Baseline  7/10    Time  3    Period  Weeks    Status  New    Target Date  12/30/18        PT Long Term Goals - 12/09/18 1049      PT LONG TERM GOAL #1   Title  Patient will demonsrate 50% improved lumbar range of motion with < or = 3/10 pain to allow for improved functional mobility.    Time  6    Period  Weeks    Status  New    Target Date  01/20/19      PT LONG TERM GOAL #2   Title  Patient will report < or = 3/10 pain with walking and standing >30 minutes to allow for improved ability to perform housework.    Time  6    Period  Weeks    Status  New    Target Date  01/20/19      PT LONG TERM GOAL #3   Title  Patient will display improved functional as measured < or = 46% limited on FOTO.    Baseline  64% limited    Time  6    Period  Weeks    Status  New    Target Date  01/20/19             Plan - 12/09/18 1035    Clinical Impression Statement  Patient presents to phsical therapy with chronic low back pain that has been worsening. The evaluation was limited secondary to patient's pain level following SLR on right and she required the use of MHP to reduce pain. It is unclear exactly what is causing her pain, and she demonstrates a high irritability level. She does exhibit limited lumbar motion and core strength deficits based on movement pattern. She would benefit from skilled PT to improve  her motion and strength in order to allowe to be less limited with daily activities such as walking and cleaning tasks around her home.    Personal Factors and Comorbidities  Fitness;Time since onset of injury/illness/exacerbation;Comorbidity 3+;Finances    Comorbidities  Anxiety, Depression, HTN, sleep dysfunction    Examination-Activity Limitations  Bed Mobility;Bend;Carry;Reach Overhead;Locomotion Level;Sit;Sleep;Squat;Stand    Examination-Participation Restrictions  Cleaning;Community Activity;Driving;Laundry;Yard Work    Public affairs consultant  Moderate    Rehab Potential  Good    PT Frequency  2x / week    PT Duration  6 weeks    PT Treatment/Interventions  ADLs/Self Care Home Management;Cryotherapy;Electrical Stimulation;Moist Heat;Traction;Ultrasound;Functional mobility training;Therapeutic activities;Therapeutic exercise;Neuromuscular re-education;Balance training;Patient/family education;Manual techniques;Dry needling;Passive range of motion;Taping;Spinal Manipulations;Joint Manipulations    PT Next Visit Plan  Gentle progression of lumbar motion, initiate core activation in supine and progress as able    PT Home Exercise Plan  Seated pelvic tilts x15-20 every hour    Consulted and Agree with Plan of Care  Patient       Patient will benefit from skilled therapeutic intervention in order to improve the following deficits and impairments:  Decreased range of motion, Decreased activity tolerance, Decreased strength, Decreased mobility, Difficulty walking, Improper body mechanics, Impaired flexibility, Pain  Visit Diagnosis: Chronic right-sided low back pain, unspecified whether sciatica present     Problem List Patient Active Problem List   Diagnosis Date Noted  . Carpal tunnel syndrome 10/16/2017  . Anxiety and depression 10/16/2017    Hilda Blades, PT, DPT, LAT, ATC 12/09/18  11:11 AM Phone:  [phone] Fax: [facility] [address] [place], [place], [address] Phone: [phone]   Fax:  [phone]  Name: Brandy [name] MRN: [id number] Date of Birth: 13-Oct-1970

## 2018-12-09 NOTE — Patient Instructions (Signed)
Access Code: [email or web address]  URL: [email or web address]  Date: 12/09/2018  Prepared by: Hilda Blades   Exercises Seated Pelvic Tilt - 15-20 reps (perform every hour)

## 2018-12-17 ENCOUNTER — Other Ambulatory Visit: Payer: Self-pay

## 2018-12-17 ENCOUNTER — Encounter: Payer: Self-pay | Admitting: Physical Therapy

## 2018-12-17 ENCOUNTER — Ambulatory Visit: Payer: Self-pay | Admitting: Physical Therapy

## 2018-12-17 DIAGNOSIS — M545 Low back pain: Secondary | ICD-10-CM

## 2018-12-17 DIAGNOSIS — G8929 Other chronic pain: Secondary | ICD-10-CM

## 2018-12-18 NOTE — Therapy (Signed)
[place], [place], [address] Phone: [phone]   Fax:  [phone]  Physical Therapy Treatment  Patient Details  Name: Brandy [name] MRN: [id number] Date of Birth: December 26, 1970 Referring Provider (PT): Charlott Rakes, MD   Encounter Date: 12/17/2018  PT End of Session - 12/17/18 0810    Visit Number  2    Number of Visits  12    Date for PT Re-Evaluation  01/20/19    Authorization Type  [place]    PT Start Time  [phone]    PT Stop Time  0900    PT Time Calculation (min)  57 min       Past Medical History:  Diagnosis Date  . Arthritis   . Disc degeneration, lumbar 2011  . Hypertension     Past Surgical History:  Procedure Laterality Date  . TUBAL LIGATION      There were no vitals filed for this visit.  Subjective Assessment - 12/17/18 0805    Subjective  Pt reports she is having a really good day today. Pain at a 4/10 on right low back and not radiating into buttock.    Pertinent History  Depression, Anxiety    Limitations  Standing;Lifting;Walking;Sitting;House hold activities    How long can you sit comfortably?  30-40 minutes    How long can you stand comfortably?  20 minutes    How long can you walk comfortably?  20 minutes    Diagnostic tests  X-ray - mild degenerative changes at L5/S1    Patient Stated Goals  Patient reports she wants to be able to walk, perform household work, and move better with less pain and limitation    Currently in Pain?  Yes    Pain Score  4     Pain Location  Back    Pain Orientation  Right;Lower    Pain Descriptors / Indicators  Spasm   pulling with turning   Pain Type  Chronic pain    Pain Radiating Towards  not in buttock today    Aggravating Factors   standing, walking , rotation, walking    Pain Relieving Factors  meds, TENS, heat, lying on left side                       [facility] Adult PT Treatment/Exercise -  12/17/18 0001      Self-Care   Self-Care  Other Self-Care Comments    Other Self-Care Comments   Instructed pt in self soft tissue work using tennis ball and roller, educated patient in use of pillow between knees in sidelying.       Lumbar Exercises: Stretches   Single Knee to Chest Stretch  3 reps;30 seconds    Single Knee to Chest Stretch Limitations  min increase pain on right     Lower Trunk Rotation Limitations  small ROM, began to irritate so discontinued     Piriformis Stretch  3 reps;30 seconds    Piriformis Stretch Limitations  modified with ankle to opposite knee     Figure 4 Stretch  3 reps;30 seconds    Figure 4 Stretch Limitations  modified with ankle on opposite knee       Lumbar Exercises: Aerobic   Nustep  L4 UE/LE x 5 minutes       Lumbar Exercises: Seated   Other Seated Lumbar Exercises  Seated pelvic tilts x10      Lumbar  Exercises: Sidelying   Clam  10 reps      Manual: massage roller to posterior hip/Right low back.        PT Education - 12/17/18 0911    Education Details  HEP, stop any exercise that is painful.    Person(s) Educated  Patient    Methods  Explanation;Handout    Comprehension  Verbalized understanding       PT Short Term Goals - 12/09/18 1046      PT SHORT TERM GOAL #1   Title  Patient will be independent in HEP in order to maintain progress in physical therapy.    Time  3    Period  Weeks    Status  New    Target Date  12/30/18      PT SHORT TERM GOAL #2   Title  Patient will report report < or 5/10 pain right lower back with standing and walking to improve functional mobility.    Baseline  7/10    Time  3    Period  Weeks    Status  New    Target Date  12/30/18        PT Long Term Goals - 12/09/18 1049      PT LONG TERM GOAL #1   Title  Patient will demonsrate 50% improved lumbar range of motion with < or = 3/10 pain to allow for improved functional mobility.    Time  6    Period  Weeks    Status  New    Target  Date  01/20/19      PT LONG TERM GOAL #2   Title  Patient will report < or = 3/10 pain with walking and standing >30 minutes to allow for improved ability to perform housework.    Time  6    Period  Weeks    Status  New    Target Date  01/20/19      PT LONG TERM GOAL #3   Title  Patient will display improved functional as measured < or = 46% limited on FOTO.    Baseline  64% limited    Time  6    Period  Weeks    Status  New    Target Date  01/20/19            Plan - 12/17/18 0912    Clinical Impression Statement  Pt reports she is having less pain today. Able to initiate HEP for stretching and instruct her in self soft tissue work with tennis ball or cardboard roller. She had some irritation with side clam shell. HMP at end of session decreased her pain. Updated her HEP with caution to stop any exercise that is causing lasting pain. Patient verbalized understanding.    PT Next Visit Plan  Gentle progression of lumbar motion, initiate core activation in supine and progress as able    PT Home Exercise Plan  Seated pelvic tilts x15-20 every hour, Single knee to chest, piriformis stretch, mod figure 4, side clam    Consulted and Agree with Plan of Care  Patient       Patient will benefit from skilled therapeutic intervention in order to improve the following deficits and impairments:  Decreased range of motion, Decreased activity tolerance, Decreased strength, Decreased mobility, Difficulty walking, Improper body mechanics, Impaired flexibility, Pain  Visit Diagnosis: Chronic right-sided low back pain, unspecified whether sciatica present     Problem List Patient Active Problem List  Diagnosis Date Noted  . Carpal tunnel syndrome 10/16/2017  . Anxiety and depression 10/16/2017    Dorene Ar, PTA 12/18/2018, 7:10 AM  [facility] [address] [place], [place], [address] Phone: [phone]   Fax:   [phone]  Name: Brandy [name] MRN: [id number] Date of Birth: 30-Jul-1970

## 2018-12-22 ENCOUNTER — Ambulatory Visit: Payer: Self-pay | Admitting: Physical Therapy

## 2018-12-24 ENCOUNTER — Ambulatory Visit: Payer: Self-pay | Attending: Family Medicine | Admitting: Physical Therapy

## 2018-12-24 ENCOUNTER — Other Ambulatory Visit: Payer: Self-pay

## 2018-12-24 ENCOUNTER — Encounter: Payer: Self-pay | Admitting: Physical Therapy

## 2018-12-24 DIAGNOSIS — M545 Low back pain, unspecified: Secondary | ICD-10-CM

## 2018-12-24 DIAGNOSIS — G8929 Other chronic pain: Secondary | ICD-10-CM | POA: Insufficient documentation

## 2018-12-24 NOTE — Therapy (Signed)
[place], [place], [address] Phone: [phone]   Fax:  [phone]  Physical Therapy Treatment  Patient Details  Name: Brandy [name] MRN: [id number] Date of Birth: October 02, 1970 Referring Provider (PT): Charlott Rakes, MD   Encounter Date: 12/24/2018  PT End of Session - 12/24/18 1224    Visit Number  3    Number of Visits  12    Date for PT Re-Evaluation  01/20/19    Authorization Type  [place]    PT Start Time  1218    PT Stop Time  1300    PT Time Calculation (min)  42 min    Activity Tolerance  Patient limited by pain    Behavior During Therapy  [facility] for tasks assessed/performed;Anxious       Past Medical History:  Diagnosis Date  . Arthritis   . Disc degeneration, lumbar 2011  . Hypertension     Past Surgical History:  Procedure Laterality Date  . TUBAL LIGATION      There were no vitals filed for this visit.  Subjective Assessment - 12/24/18 1221    Subjective  Patient reports she is about the same, she still has moments of pain. She does note that today is a good day. She reports the exercises do cause increase pain so she does them on a good day.    Currently in Pain?  Yes    Pain Score  4     Pain Location  Back    Pain Orientation  Right;Lower    Pain Descriptors / Indicators  Sore;Spasm    Pain Type  Chronic pain    Pain Radiating Towards  denies any radiating pain    Pain Onset  More than a month ago    Pain Frequency  Constant         [facility] PT Assessment - 12/24/18 0001      ROM / Strength   AROM / PROM / Strength  AROM;Strength      AROM   AROM Assessment Site  Lumbar    Lumbar Flexion  WFL - improvement in pain reported    Lumbar Extension  50% limited - pain right lower back    Lumbar - Right Side Bend  25% limited - pain on right side    Lumbar - Left Side Bend  25% limited - pulling on right side      Strength   Overall Strength Comments   Unable to assess seconday to pain                   [facility] Adult PT Treatment/Exercise - 12/24/18 0001      Exercises   Exercises  Lumbar      Lumbar Exercises: Stretches   Single Knee to Chest Stretch  30 seconds    Single Knee to Chest Stretch Limitations  min increased right back/hip pain    Piriformis Stretch  2 reps;30 seconds    Piriformis Stretch Limitations  modified with ankle to opposite knee     Figure 4 Stretch  2 reps;30 seconds    Figure 4 Stretch Limitations  modified with ankle on opposite knee     Other Lumbar Stretch Exercise  Child's pose stretch 3x20 sec      Lumbar Exercises: Aerobic   Nustep  L5 UE/LE x 5 minutes       Lumbar Exercises: Supine   Pelvic Tilt  10 reps  Pelvic Tilt Limitations  posterior    Bridge  10 reps    Bridge Limitations  Emphasis on posterior pelvic tilt and maintaining core engagement with a small lift      Lumbar Exercises: Sidelying   Clam  10 reps    Clam Limitations  Patient reported increased right hip/leg buringing so exercise was d/c'd      Lumbar Exercises: Quadruped   Madcat/Old Horse  10 reps    Madcat/Old Horse Limitations  focus on pelvic control, slow and controlled, patient reported min pain at end range extension             PT Education - 12/24/18 1223    Education Details  HEP    Person(s) Educated  Patient    Methods  Explanation;Demonstration;Verbal cues;Handout    Comprehension  Verbalized understanding;Verbal cues required;Need further instruction       PT Short Term Goals - 12/09/18 1046      PT SHORT TERM GOAL #1   Title  Patient will be independent in HEP in order to maintain progress in physical therapy.    Time  3    Period  Weeks    Status  New    Target Date  12/30/18      PT SHORT TERM GOAL #2   Title  Patient will report report < or 5/10 pain right lower back with standing and walking to improve functional mobility.    Baseline  7/10    Time  3    Period  Weeks     Status  New    Target Date  12/30/18        PT Long Term Goals - 12/09/18 1049      PT LONG TERM GOAL #1   Title  Patient will demonsrate 50% improved lumbar range of motion with < or = 3/10 pain to allow for improved functional mobility.    Time  6    Period  Weeks    Status  New    Target Date  01/20/19      PT LONG TERM GOAL #2   Title  Patient will report < or = 3/10 pain with walking and standing >30 minutes to allow for improved ability to perform housework.    Time  6    Period  Weeks    Status  New    Target Date  01/20/19      PT LONG TERM GOAL #3   Title  Patient will display improved functional as measured < or = 46% limited on FOTO.    Baseline  64% limited    Time  6    Period  Weeks    Status  New    Target Date  01/20/19            Plan - 12/24/18 1224    Clinical Impression Statement  Patient is tolerating more activity and exercise with only minimal increases in right lower back pain. The clamshell exercise was d/c'd as this increased pain more. She did not reported any referred pain during the visit. She was encouraged to continue performing her exercises as tolerated and through a comfortable range to avoid aggravating her back. She would benefit from continued skilled PT to improve her pain with activity and return to PLOF.    PT Treatment/Interventions  ADLs/Self Care Home Management;Cryotherapy;Electrical Stimulation;Moist Heat;Traction;Ultrasound;Functional mobility training;Therapeutic activities;Therapeutic exercise;Neuromuscular re-education;Balance training;Patient/family education;Manual techniques;Dry needling;Passive range of motion;Taping;Spinal Manipulations;Joint Manipulations    PT Next Visit  Plan  Progression of core and hip motion and strengthening as tolerated    PT Home Exercise Plan  Seated pelvic tilts x15-20 every hour, Supine single knee to chest, Supine piriformis stretch, Supine figure 4 stretch, Mini-bridge with posterior pelvic  tilt, Cat-camel, Child's pose stretch    Consulted and Agree with Plan of Care  Patient       Patient will benefit from skilled therapeutic intervention in order to improve the following deficits and impairments:  Decreased range of motion, Decreased activity tolerance, Decreased strength, Decreased mobility, Difficulty walking, Improper body mechanics, Impaired flexibility, Pain  Visit Diagnosis: Chronic right-sided low back pain, unspecified whether sciatica present     Problem List Patient Active Problem List   Diagnosis Date Noted  . Carpal tunnel syndrome 10/16/2017  . Anxiety and depression 10/16/2017    Hilda Blades, PT, DPT, LAT, ATC 12/24/18  1:18 PM Phone: [phone] Fax: [facility] [address] [place], [place], [address] Phone: [phone]   Fax:  [phone]  Name: Brandy [name] MRN: [id number] Date of Birth: 06/25/1970

## 2018-12-30 ENCOUNTER — Encounter: Payer: Self-pay | Admitting: Physical Therapy

## 2018-12-30 ENCOUNTER — Other Ambulatory Visit: Payer: Self-pay

## 2018-12-30 ENCOUNTER — Ambulatory Visit: Payer: Self-pay | Admitting: Physical Therapy

## 2018-12-30 DIAGNOSIS — G8929 Other chronic pain: Secondary | ICD-10-CM

## 2018-12-30 NOTE — Therapy (Signed)
[place], [place], [address] Phone: [phone]   Fax:  [phone]  Physical Therapy Treatment  Patient Details  Name: Brandy [name] MRN: [id number] Date of Birth: 1971-01-15 Referring Provider (PT): Charlott Rakes, MD   Encounter Date: 12/30/2018  PT End of Session - 12/30/18 0837    Visit Number  4    Number of Visits  12    Date for PT Re-Evaluation  01/20/19    Authorization Type  [place]    PT Start Time  [phone]    PT Stop Time  0910    PT Time Calculation (min)  41 min    Activity Tolerance  Patient limited by pain    Behavior During Therapy  [facility] for tasks assessed/performed;Anxious       Past Medical History:  Diagnosis Date  . Arthritis   . Disc degeneration, lumbar 2011  . Hypertension     Past Surgical History:  Procedure Laterality Date  . TUBAL LIGATION      There were no vitals filed for this visit.  Subjective Assessment - 12/30/18 0831    Subjective  Patient reports she is having a little more pain and stiffness this visit. She is having more on the right hip. She reports her exercises are going pretty good.    Currently in Pain?  Yes    Pain Score  8     Pain Location  Back    Pain Orientation  Right;Left    Pain Descriptors / Indicators  Other (Comment);Throbbing   Stiff   Pain Type  Chronic pain    Pain Onset  More than a month ago    Pain Frequency  Constant                       [facility] Adult PT Treatment/Exercise - 12/30/18 0001      Exercises   Exercises  Lumbar      Lumbar Exercises: Aerobic   Nustep  L5 UE/LE x 5 minutes       Lumbar Exercises: Supine   Pelvic Tilt  10 reps    Pelvic Tilt Limitations  posterior and anterior    Clam  5 reps   2 sets   Clam Limitations  with posterior pelvic tilt and yellow band    Bridge  5 reps   2 sets   Bridge Limitations  Emphasis on posterior pelvic tilt and maintaining core  engagement with a small lift    Other Supine Lumbar Exercises  Hooklying ball squeeze with posterior pelvic tilt 2x5    Other Supine Lumbar Exercises  Hookying marching with posterior pelvic tilt 2x5   patient limited in lifting right leg due to pain            PT Education - 12/30/18 0836    Education Details  HEP, modalities for pain management    Person(s) Educated  Patient    Methods  Explanation;Demonstration;Verbal cues;Handout    Comprehension  Verbalized understanding;Verbal cues required;Need further instruction       PT Short Term Goals - 12/30/18 [id number]      PT SHORT TERM GOAL #1   Title  Patient will be independent in HEP in order to maintain progress in physical therapy.    Time  3    Period  Weeks    Status  Achieved    Target Date  12/30/18  PT SHORT TERM GOAL #2   Title  Patient will report report < or 5/10 pain right lower back with standing and walking to improve functional mobility.    Baseline  7/10    Time  3    Period  Weeks    Status  On-going   she reports good days where she is < 5/10 but today is in more pain   Target Date  12/30/18        PT Long Term Goals - 12/09/18 1049      PT LONG TERM GOAL #1   Title  Patient will demonsrate 50% improved lumbar range of motion with < or = 3/10 pain to allow for improved functional mobility.    Time  6    Period  Weeks    Status  New    Target Date  01/20/19      PT LONG TERM GOAL #2   Title  Patient will report < or = 3/10 pain with walking and standing >30 minutes to allow for improved ability to perform housework.    Time  6    Period  Weeks    Status  New    Target Date  01/20/19      PT LONG TERM GOAL #3   Title  Patient will display improved functional as measured < or = 46% limited on FOTO.    Baseline  64% limited    Time  6    Period  Weeks    Status  New    Target Date  01/20/19            Plan - 12/30/18 [id number]    Clinical Impression Statement  Patient reported  improvement in feeling of stiffness level following therapy. LAD on the right was trialed this visit to reduce pain level but patient reported a ripping sensation so this was d/c'd. Therapy was limited due to her pain level and she was not able to tolerate any progressions in her exercises. All exercises this visit were performed through comfortable range, and she did not report and referred pain. She was encouraged to work on exercises throuhgout the day and perform stretching as tolerated.    PT Treatment/Interventions  ADLs/Self Care Home Management;Cryotherapy;Electrical Stimulation;Moist Heat;Traction;Ultrasound;Functional mobility training;Therapeutic activities;Therapeutic exercise;Neuromuscular re-education;Balance training;Patient/family education;Manual techniques;Dry needling;Passive range of motion;Taping;Spinal Manipulations;Joint Manipulations    PT Next Visit Plan  Progression of core and hip strengthening as tolerated    PT Home Exercise Plan  Seated pelvic tilts x15-20 every hour, Supine single knee to chest, Supine piriformis stretch, Supine figure 4 stretch, Mini-bridge with posterior pelvic tilt, Cat-camel, Child's pose stretch, Hooklying clamshell with yellow band    Consulted and Agree with Plan of Care  Patient       Patient will benefit from skilled therapeutic intervention in order to improve the following deficits and impairments:  Decreased range of motion, Decreased activity tolerance, Decreased strength, Decreased mobility, Difficulty walking, Improper body mechanics, Impaired flexibility, Pain  Visit Diagnosis: Chronic right-sided low back pain, unspecified whether sciatica present     Problem List Patient Active Problem List   Diagnosis Date Noted  . Carpal tunnel syndrome 10/16/2017  . Anxiety and depression 10/16/2017    Hilda Blades, PT, DPT, LAT, ATC 12/30/18  9:19 AM Phone: [phone] Fax: [place]  [facility] [address] [place], [place], [address] Phone: [phone]   Fax:  [phone]  Name: Brandy [name] MRN: [id number] Date  of Birth: 1970-09-20

## 2019-01-06 ENCOUNTER — Ambulatory Visit: Payer: Self-pay | Admitting: Physical Therapy

## 2019-01-06 ENCOUNTER — Other Ambulatory Visit: Payer: Self-pay

## 2019-01-06 ENCOUNTER — Encounter: Payer: Self-pay | Admitting: Physical Therapy

## 2019-01-06 DIAGNOSIS — G8929 Other chronic pain: Secondary | ICD-10-CM

## 2019-01-06 DIAGNOSIS — M545 Low back pain, unspecified: Secondary | ICD-10-CM

## 2019-01-06 MED FILL — ?HYDROCHLOROTHIAZIDE 25MG T: 25 | 30 days supply | Qty: 30 | Fill #2

## 2019-01-06 MED FILL — ?AMLODIPINE BESYLATE 2.5MG.: 2.5 | 30 days supply | Qty: 30 | Fill #2

## 2019-01-06 NOTE — Patient Instructions (Signed)
Access Code: [email or web address]  URL: [email or web address]  Date: 01/06/2019  Prepared by: Hilda Blades   Exercises Bird Dog - 10 reps - 2 sets - 1x daily - 3-4x weekly

## 2019-01-06 NOTE — Therapy (Signed)
[place], [place], [address] Phone: [phone]   Fax:  [phone]  Physical Therapy Treatment  Patient Details  Name: Brandy [name] MRN: [id number] Date of Birth: 06/09/1970 Referring Provider (PT): Charlott Rakes, MD   Encounter Date: 01/06/2019  PT End of Session - 01/06/19 0843    Visit Number  5    Number of Visits  12    Date for PT Re-Evaluation  01/20/19    Authorization Type  [employer]    PT Start Time  [phone]    PT Stop Time  0915    PT Time Calculation (min)  38 min    Activity Tolerance  Patient limited by pain    Behavior During Therapy  [facility] for tasks assessed/performed;Anxious       Past Medical History:  Diagnosis Date  . Arthritis   . Disc degeneration, lumbar 2011  . Hypertension     Past Surgical History:  Procedure Laterality Date  . TUBAL LIGATION      There were no vitals filed for this visit.  Subjective Assessment - 01/06/19 0841    Subjective  Patient reports she is doing pretty good. She has been consistent with her exercises and they are feeling better.    Currently in Pain?  Yes    Pain Score  4     Pain Location  Back    Pain Orientation  Right;Lower    Pain Descriptors / Indicators  --   Pulling   Pain Type  Chronic pain    Pain Onset  More than a month ago    Pain Frequency  Intermittent         [facility] PT Assessment - 01/06/19 0001      ROM / Strength   AROM / PROM / Strength  Strength      Strength   Overall Strength Comments  Core strength grossly 3+/5, she has difficulty maintaining neutral lumbar spine    Strength Assessment Site  Hip    Right/Left Hip  Right;Left    Right Hip Flexion  4-/5    Right Hip Extension  4-/5    Right Hip ABduction  3+/5    Left Hip Flexion  4/5    Left Hip Extension  4-/5    Left Hip ABduction  4-/5                   [facility] Adult PT Treatment/Exercise - 01/06/19 0001      Exercises   Exercises  Lumbar      Lumbar Exercises: Aerobic   Nustep  L5 UE/LE x 5 minutes       Lumbar Exercises: Supine   Pelvic Tilt  10 reps    Pelvic Tilt Limitations  posterior and anterior    Clam  10 reps    Clam Limitations  with posterior pelvic tilt and yellow band    Bridge  5 reps   2 sets   Bridge Limitations  Emphasis on posterior pelvic tilt and maintaining core engagement with a small lift    Other Supine Lumbar Exercises  Dead bug (legs only) with knees bent x5 each   patient unable to perform >5 reps each due to fatigue   Other Supine Lumbar Exercises  Marching with posterior pelvic tilt 2x5 each      Lumbar Exercises: Prone   Other Prone Lumbar Exercises  Trialed prone press up but patient reported right lower  back pain, she was able to tolerated prone lying on forearms      Lumbar Exercises: Quadruped   Madcat/Old Horse  10 reps   performed into child's pose position   Madcat/Old Horse Limitations  focus on pelvic control, slow and controlled    Other Quadruped Lumbar Exercises  Quadruped alternating hip extension (bird dog legs only) 2x5 each   cued to engage core to avoid lumbar extension and rotation            PT Education - 01/06/19 0842    Education Details  HEP    Person(s) Educated  Patient    Methods  Explanation;Demonstration;Verbal cues    Comprehension  Verbalized understanding;Returned demonstration;Verbal cues required;Need further instruction       PT Short Term Goals - 12/30/18 0838      PT SHORT TERM GOAL #1   Title  Patient will be independent in HEP in order to maintain progress in physical therapy.    Time  3    Period  Weeks    Status  Achieved    Target Date  12/30/18      PT SHORT TERM GOAL #2   Title  Patient will report report < or 5/10 pain right lower back with standing and walking to improve functional mobility.    Baseline  7/10    Time  3    Period  Weeks    Status  On-going   she reports good days where  she is < 5/10 but today is in more pain   Target Date  12/30/18        PT Long Term Goals - 12/09/18 1049      PT LONG TERM GOAL #1   Title  Patient will demonsrate 50% improved lumbar range of motion with < or = 3/10 pain to allow for improved functional mobility.    Time  6    Period  Weeks    Status  New    Target Date  01/20/19      PT LONG TERM GOAL #2   Title  Patient will report < or = 3/10 pain with walking and standing >30 minutes to allow for improved ability to perform housework.    Time  6    Period  Weeks    Status  New    Target Date  01/20/19      PT LONG TERM GOAL #3   Title  Patient will display improved functional as measured < or = 46% limited on FOTO.    Baseline  64% limited    Time  6    Period  Weeks    Status  New    Target Date  01/20/19            Plan - 01/06/19 0843    Clinical Impression Statement  Patient tolerated progressions in core and hip strenthening well this visit. Trialed prone press up but patient reported increased low back pain so d/c'd. She exhibited fatigue with core strengthening and when she fatigued she started having right sided low back pain. She would benefit from continued skilled PT to porgress her core strength and return to PLOF.    PT Treatment/Interventions  ADLs/Self Care Home Management;Cryotherapy;Electrical Stimulation;Moist Heat;Traction;Ultrasound;Functional mobility training;Therapeutic activities;Therapeutic exercise;Neuromuscular re-education;Balance training;Patient/family education;Manual techniques;Dry needling;Passive range of motion;Taping;Spinal Manipulations;Joint Manipulations    PT Next Visit Plan  Progression of core and hip strengthening as tolerated    PT Home Exercise Plan  Seated pelvic tilts  x15-20 every hour, Supine single knee to chest, Supine piriformis stretch, Supine figure 4 stretch, Mini-bridge with posterior pelvic tilt, Cat-camel, Child's pose stretch, Hooklying clamshell with yellow  band, Quadruped hip extension (bird dog)    Consulted and Agree with Plan of Care  Patient       Patient will benefit from skilled therapeutic intervention in order to improve the following deficits and impairments:  Decreased range of motion, Decreased activity tolerance, Decreased strength, Decreased mobility, Difficulty walking, Improper body mechanics, Impaired flexibility, Pain  Visit Diagnosis: Chronic right-sided low back pain, unspecified whether sciatica present     Problem List Patient Active Problem List   Diagnosis Date Noted  . Carpal tunnel syndrome 10/16/2017  . Anxiety and depression 10/16/2017    Hilda Blades, PT, DPT, LAT, ATC 01/06/19  10:00 AM Phone: [phone] Fax: [facility] [address] [place], [place], [address] Phone: [phone]   Fax:  [phone]  Name: SILVIA YONG MRN: [id number] Date of Birth: 1970-05-10

## 2019-01-13 ENCOUNTER — Ambulatory Visit: Payer: Self-pay | Admitting: Physical Therapy

## 2019-01-19 ENCOUNTER — Encounter (HOSPITAL_COMMUNITY): Payer: Self-pay

## 2019-01-19 ENCOUNTER — Other Ambulatory Visit: Payer: Self-pay

## 2019-01-19 ENCOUNTER — Ambulatory Visit (HOSPITAL_COMMUNITY)
Admission: RE | Admit: 2019-01-19 | Discharge: 2019-01-19 | Disposition: A | Discharge status: Home / Self Care | Payer: Self-pay | Source: Ambulatory Visit | Attending: Obstetrics and Gynecology | Admitting: Obstetrics and Gynecology

## 2019-01-19 ENCOUNTER — Ambulatory Visit
Admission: RE | Admit: 2019-01-19 | Discharge: 2019-01-19 | Disposition: A | Discharge status: Home / Self Care | Payer: No Typology Code available for payment source | Source: Ambulatory Visit | Attending: Obstetrics and Gynecology | Admitting: Obstetrics and Gynecology

## 2019-01-19 DIAGNOSIS — Z1239 Encounter for other screening for malignant neoplasm of breast: Secondary | ICD-10-CM | POA: Insufficient documentation

## 2019-01-19 DIAGNOSIS — Z1231 Encounter for screening mammogram for malignant neoplasm of breast: Secondary | ICD-10-CM

## 2019-01-19 NOTE — Progress Notes (Signed)
No complaints today.   Pap Smear: Pap smear not completed today. Last Pap smear was 04/22/2018 at [facility] and normal with negative HPV positive for BV. Per patient has no history of an abnormal Pap smear. Last two Pap smear results are in Epic.  Physical exam: Breasts Breasts symmetrical. No skin abnormalities bilateral breasts. No nipple retraction bilateral breasts. No nipple discharge bilateral breasts. No lymphadenopathy. No lumps palpated bilateral breasts. No complaints of pain or tenderness on exam. Referred patient to the [facility] for a screening mammogram. Appointment scheduled for Tuesday, January 19, 2019 at 1200.        Pelvic/Bimanual No Pap smear completed today since last Pap smear and HPV typing was 04/22/2018. Pap smear not indicated per BCCCP guidelines.   Smoking History: Patient has never smoked.  Patient Navigation: Patient education provided. Access to services provided for patient through B[facility]CP program.   Breast and Cervical Cancer Risk Assessment: Patient has a family history of her mother and a maternal aunt having breast cancer. Patient has no known genetic mutations or history of  radiation treatment to the chest before age 65. Patient has no history of cervical dysplasia, immunocompromised, or DES exposure in-utero.  Risk Assessment    Risk Scores      01/19/2019 08/28/2017   Last edited by: Loletta Parish, RN Coralynn Gaona, Heath Gold, RN   5-year risk: 1.3 % 1.1 %   Lifetime risk: 10.8 % 11.1 %

## 2019-01-19 NOTE — Patient Instructions (Signed)
Explained breast self awareness with Brandy [name]. Patient did not need a Pap smear today due to last Pap smear and HPV typing was 04/22/2018. Let her know B[facility]CP will cover Pap smears and HPV typing every 5 years unless has a history of abnormal Pap smears. Referred patient to the [facility] for a screening mammogram. Appointment scheduled for Tuesday, January 19, 2019 at 1200. Patient aware of appointment and will be there. Let [facility] know [facility] will [facility] up with her within the next couple weeks with results of mammogram by letter or phone. Brandy [name] verbalized understanding.  Ankit Degregorio, Arvil Chaco, RN 10:53 AM

## 2019-01-20 ENCOUNTER — Encounter: Payer: Self-pay | Admitting: Physical Therapy

## 2019-01-20 ENCOUNTER — Other Ambulatory Visit: Payer: Self-pay

## 2019-01-20 ENCOUNTER — Ambulatory Visit: Payer: No Typology Code available for payment source | Attending: Family Medicine | Admitting: Physical Therapy

## 2019-01-20 DIAGNOSIS — G8929 Other chronic pain: Secondary | ICD-10-CM | POA: Insufficient documentation

## 2019-01-20 DIAGNOSIS — M545 Low back pain: Secondary | ICD-10-CM | POA: Insufficient documentation

## 2019-01-20 NOTE — Therapy (Addendum)
[place], [place], [address] Phone: [phone]   Fax:  [phone]  Physical Therapy Treatment  Discharge PHYSICAL THERAPY DISCHARGE SUMMARY  Visits from Start of Care: 6  Current functional level related to goals / functional outcomes: Patient did not meet functional goals (see below)   Remaining deficits: Pain, motion, strength   Education / Equipment: HEP Plan: Patient agrees to discharge.  Patient goals were not met. Patient is being discharged due to not returning since the last visit.  ?????  Hilda Blades, PT, DPT 04/01/19  4:01 PM Phone: [phone] Fax: [phone]       Patient Details  Name: Brandy [name] MRN: [id number] Date of Birth: 04/16/1970 Referring Provider (PT): Charlott Rakes, MD   Encounter Date: 01/20/2019  PT End of Session - 01/20/19 0849    Visit Number  6    Number of Visits  12    Date for PT Re-Evaluation  03/03/19    Authorization Type  [employer]    PT Start Time  [phone]    PT Stop Time  0917    PT Time Calculation (min)  35 min    Activity Tolerance  Patient limited by pain    Behavior During Therapy  [facility] for tasks assessed/performed;Anxious       Past Medical History:  Diagnosis Date  . Arthritis   . Disc degeneration, lumbar 2011  . Hypertension     Past Surgical History:  Procedure Laterality Date  . TUBAL LIGATION      There were no vitals filed for this visit.  Subjective Assessment - 01/20/19 0846    Subjective  Patient reports she is doing well today. She was unable to come to last weeks appointment due to increased pain level. She states that she did a lot of walking this past Friday, she had moments where she had to stop and rest. She notes that laying on her left side and she tries to move that she has increased pain on the right side. She denies having done any of her exercises since last visit.    Currently  in Pain?  Yes    Pain Score  6     Pain Location  Back    Pain Orientation  Right;Lower    Pain Descriptors / Indicators  Throbbing   pulling   Pain Type  Chronic pain    Pain Onset  More than a month ago    Pain Frequency  Intermittent         [facility] PT Assessment - 01/20/19 0001      Assessment   Medical Diagnosis  Chronic low back pain    Referring Provider (PT)  Charlott Rakes, MD    Onset Date/Surgical Date  --   5-6 years ago, worsened over past year     Prior Function   Level of Independence  Independent      Observation/Other Assessments   Observations  Patient continues to exhibit guarded movement    Focus on Therapeutic Outcomes (FOTO)   48% limitation      Posture/Postural Control   Posture Comments  Patient exhibit even ASIS, PSIS, iliac crests; no shift noted, slightly increased lumbar lordosis       ROM / Strength   AROM / PROM / Strength  AROM;Strength      AROM   AROM Assessment Site  Lumbar    Lumbar Flexion  WFL    Lumbar Extension  50% limited - pain right lower back    Lumbar - Right Side Bend  25% limited    Lumbar - Left Side Bend  25% limited    Lumbar - Right Rotation  25% limited - small pull on right side    Lumbar - Left Rotation  25% limited - small pull on right side      Strength   Overall Strength Comments  Core strength grossly 3+/5, she has difficulty maintaining neutral lumbar spine    Strength Assessment Site  Hip    Right/Left Hip  Right;Left    Right Hip Extension  4-/5    Right Hip ABduction  3+/5    Left Hip Extension  4-/5    Left Hip ABduction  4-/5      Palpation   Palpation comment  Patient reports increased low back tenderness over L4-5 region and right PSIS region      Special Tests   Lumbar Tests  Straight Leg Raise      Straight Leg Raise   Side   Right    Comment  Patient able to tolerated 45 deg before onset of right low back pain      Transfers   Transfers  Independent with all Transfers                    [facility] Adult PT Treatment/Exercise - 01/20/19 0001      Exercises   Exercises  Lumbar      Lumbar Exercises: Aerobic   Nustep  L5 UE/LE x 5 minutes       Lumbar Exercises: Supine   Bridge  5 reps   2 sets   Bridge Limitations  Emphasis on posterior pelvic tilt and maintaining core engagement with a small lift    Other Supine Lumbar Exercises  Hooklying ball squeeze with posterior pelvic tilt 2x5    Other Supine Lumbar Exercises  Marching with posterior pelvic tilt 2x5 each      Lumbar Exercises: Sidelying   Clam  10 reps    Clam Limitations  Patient reported increased right low back pain    Other Sidelying Lumbar Exercises  Worked on log rolling from side to back, and from back to side to avoid low back pain      Lumbar Exercises: Quadruped   Madcat/Old Horse  10 reps    Madcat/Old Horse Limitations  focus on pelvic control, slow and controlled    Other Quadruped Lumbar Exercises  Quadruped alternating hip extension (bird dog legs only) x5 each   patient reported increased right sided low back pain     Manual Therapy   Manual Therapy  Muscle Energy Technique    Muscle Energy Technique  Trialed muscle engergy technique for SI and pelvic, she did not report any improvement in symptoms             PT Education - 01/20/19 0848    Education Details  HEP, consistency with exercises through pain free range    Person(s) Educated  Patient    Methods  Explanation;Demonstration;Verbal cues;Tactile cues    Comprehension  Verbalized understanding;Returned demonstration;Verbal cues required;Need further instruction       PT Short Term Goals - 01/20/19 1256      PT SHORT TERM GOAL #1   Title  Patient will be independent in HEP in order to maintain progress in physical therapy.    Time  3    Period  Weeks    Status  Achieved    Target Date  12/30/18      PT SHORT TERM GOAL #2   Title  Patient will report report < or 5/10 pain right lower back with  standing and walking to improve functional mobility.    Baseline  7/10    Time  3    Period  Weeks    Status  On-going   she reports good days where she is < 5/10 but today is in more pain   Target Date  12/30/18        PT Long Term Goals - 01/20/19 1257      PT LONG TERM GOAL #1   Title  Patient will demonsrate 50% improved lumbar range of motion with < or = 3/10 pain to allow for improved functional mobility.    Time  6    Period  Weeks    Status  Revised   Patient continues to exhibit limited lumbar motion with pain   Target Date  03/03/19      PT LONG TERM GOAL #2   Title  Patient will report < or = 3/10 pain with walking and standing >30 minutes to allow for improved ability to perform housework.    Time  6    Period  Weeks    Status  Revised   Patient is able to stand and walk longer but with pain   Target Date  03/03/19      PT LONG TERM GOAL #3   Title  Patient will display improved functional as measured < or = 46% limited on FOTO.    Baseline  48% limitation    Time  6    Period  Weeks    Status  Revised    Target Date  03/03/19            Plan - 01/20/19 0900    Clinical Impression Statement  Patient arrived late to therapy this visit. She continues to exhibit limited lumbar range of motion with increased pain of the right lower back and significant core and hip weakness that are most likely contributing to her pain with movement. She is not exhibiting any radicular pain symptoms on the right side at this time. We trialed muscle energy techniques to reduce right sided low back pain as she was tender near the right SIJ this visit. She did not report any improvement in symptoms. She does display a high irritability level so exercise is limited to avoid increasing lower back pain. She has progressed some over the past few weeks but continues to be limited. She would benefit from continued skilled PT to improve her low back through improving motion and progressing  strength.    Rehab Potential  Good    PT Frequency  1x / week    PT Duration  6 weeks    PT Treatment/Interventions  ADLs/Self Care Home Management;Cryotherapy;Electrical Stimulation;Moist Heat;Traction;Ultrasound;Functional mobility training;Therapeutic activities;Therapeutic exercise;Neuromuscular re-education;Balance training;Patient/family education;Manual techniques;Dry needling;Passive range of motion;Taping;Spinal Manipulations;Joint Manipulations    PT Next Visit Plan  Progression of core and hip strengthening as tolerated, manual and modalities as needed    PT Home Exercise Plan  Seated pelvic tilts x15-20 every hour, Supine single knee to chest, Supine piriformis stretch, Supine figure 4 stretch, Mini-bridge with posterior pelvic tilt, Cat-camel, Child's pose stretch, Hooklying clamshell with yellow band, Quadruped hip extension (bird dog)    Consulted and Agree with Plan of Care  Patient       Patient will benefit  from skilled therapeutic intervention in order to improve the following deficits and impairments:  Decreased range of motion, Decreased activity tolerance, Decreased strength, Decreased mobility, Difficulty walking, Improper body mechanics, Impaired flexibility, Pain  Visit Diagnosis: Chronic right-sided low back pain, unspecified whether sciatica present     Problem List Patient Active Problem List   Diagnosis Date Noted  . Screening breast examination 01/19/2019  . Carpal tunnel syndrome 10/16/2017  . Anxiety and depression 10/16/2017    Hilda Blades, PT, DPT, LAT, ATC 01/20/19  1:08 PM Phone: [phone] Fax: [facility] [address] [place], [place], [address] Phone: [phone]   Fax:  [phone]  Name: Cindra Austad MRN: [id number] Date of Birth: 1970/06/05

## 2019-01-27 ENCOUNTER — Encounter: Payer: Self-pay | Admitting: Physical Therapy

## 2019-02-03 ENCOUNTER — Ambulatory Visit: Payer: Self-pay | Admitting: Physical Therapy

## 2019-02-10 ENCOUNTER — Encounter: Payer: Self-pay | Admitting: Physical Therapy

## 2019-02-15 MED FILL — ?HYDROCHLOROTHIAZIDE 25MG T: 25 | 30 days supply | Qty: 30 | Fill #3

## 2019-02-15 MED FILL — ?AMLODIPINE BESYLATE 2.5MG.: 2.5 | 30 days supply | Qty: 30 | Fill #3

## 2019-02-17 ENCOUNTER — Encounter: Payer: Self-pay | Admitting: Physical Therapy

## 2019-04-09 ENCOUNTER — Other Ambulatory Visit: Payer: Self-pay

## 2019-04-09 ENCOUNTER — Ambulatory Visit (HOSPITAL_COMMUNITY)
Admission: EM | Admit: 2019-04-09 | Discharge: 2019-04-09 | Disposition: A | Discharge status: Home / Self Care | Payer: 59 | Attending: Family Medicine | Admitting: Family Medicine

## 2019-04-09 ENCOUNTER — Encounter (HOSPITAL_COMMUNITY): Payer: Self-pay

## 2019-04-09 DIAGNOSIS — F329 Major depressive disorder, single episode, unspecified: Secondary | ICD-10-CM | POA: Diagnosis not present

## 2019-04-09 DIAGNOSIS — Z8349 Family history of other endocrine, nutritional and metabolic diseases: Secondary | ICD-10-CM | POA: Diagnosis not present

## 2019-04-09 DIAGNOSIS — H6501 Acute serous otitis media, right ear: Secondary | ICD-10-CM | POA: Insufficient documentation

## 2019-04-09 DIAGNOSIS — M1990 Unspecified osteoarthritis, unspecified site: Secondary | ICD-10-CM | POA: Diagnosis not present

## 2019-04-09 DIAGNOSIS — Z833 Family history of diabetes mellitus: Secondary | ICD-10-CM | POA: Insufficient documentation

## 2019-04-09 DIAGNOSIS — Z20822 Contact with and (suspected) exposure to covid-19: Secondary | ICD-10-CM | POA: Insufficient documentation

## 2019-04-09 DIAGNOSIS — Z79899 Other long term (current) drug therapy: Secondary | ICD-10-CM | POA: Diagnosis not present

## 2019-04-09 DIAGNOSIS — K589 Irritable bowel syndrome without diarrhea: Secondary | ICD-10-CM | POA: Diagnosis not present

## 2019-04-09 DIAGNOSIS — I10 Essential (primary) hypertension: Secondary | ICD-10-CM

## 2019-04-09 DIAGNOSIS — F419 Anxiety disorder, unspecified: Secondary | ICD-10-CM | POA: Diagnosis not present

## 2019-04-09 DIAGNOSIS — J309 Allergic rhinitis, unspecified: Secondary | ICD-10-CM | POA: Diagnosis not present

## 2019-04-09 DIAGNOSIS — H9201 Otalgia, right ear: Secondary | ICD-10-CM | POA: Diagnosis present

## 2019-04-09 DIAGNOSIS — E785 Hyperlipidemia, unspecified: Secondary | ICD-10-CM

## 2019-04-09 DIAGNOSIS — Z803 Family history of malignant neoplasm of breast: Secondary | ICD-10-CM | POA: Insufficient documentation

## 2019-04-09 MED ORDER — TRIAMCINOLONE ACETONIDE 55 MCG/ACT NA AERO: 2.0000 | INHALATION_SPRAY | Freq: Every day | NASAL | 0 refills | Status: AC

## 2019-04-09 MED ORDER — CETIRIZINE HCL 10 MG PO TABS: 10.0000 mg | ORAL_TABLET | Freq: Every day | ORAL | 0 refills | Status: DC

## 2019-04-09 MED FILL — ?AMLODIPINE BESYLATE 2.5MG.: 2.5 | 30 days supply | Qty: 30 | Fill #4

## 2019-04-09 MED FILL — ?IBUPROFEN 600 MG TABLETS: 600 | 30 days supply | Qty: 60 | Fill #1

## 2019-04-09 MED FILL — ?HYDROCHLOROTHIAZIDE 25MG T: 25 | 30 days supply | Qty: 30 | Fill #4

## 2019-04-09 MED FILL — ?CETIRIZINE HCL 10 MG TABLE: 10 | 30 days supply | Qty: 30 | Fill #0

## 2019-04-09 NOTE — ED Provider Notes (Signed)
[place]    CSN: [id number] Arrival date & time: 04/09/19  [id number]      History   Chief Complaint Chief Complaint  Patient presents with  . Otalgia    HPI Brandy [name] is a 49 y.o. female.   HPI  Patient is here for ear pain.  Pressure and popping in the right ear for 3 or 4 days.  Left ear not as much.  She also has sinus pressure.  Runny and stuffy nose.  Some postnasal drip.  Clear drainage.  No fever or chills.  No headache.  No coughing or chest congestion, no shortness of breath.  No body aches, no exposure to Covid.  She has known allergies.  Thinks this is a cold virus but coricidin HBP not helpful.  Past Medical History:  Diagnosis Date  . Arthritis   . Disc degeneration, lumbar 2011  . Hypertension     Patient Active Problem List   Diagnosis Date Noted  . Essential hypertension 04/09/2019  . HLD (hyperlipidemia) 04/09/2019  . Irritable bowel syndrome (IBS) 04/09/2019  . Screening breast examination 01/19/2019  . Carpal tunnel syndrome 10/16/2017  . Anxiety and depression 10/16/2017    Past Surgical History:  Procedure Laterality Date  . TUBAL LIGATION      OB History    Gravida  2   Para  2   Term  2   Preterm      AB      Living  3     SAB      TAB      Ectopic      Multiple  1   Live Births  3            Home Medications    Prior to Admission medications   Medication Sig Start Date End Date Taking? Authorizing Provider  amLODipine (NORVASC) 2.5 MG tablet Take 1 tablet (2.5 mg total) by mouth daily. 11/24/18   Charlott Rakes, MD  atorvastatin (LIPITOR) 40 MG tablet Take 1 tablet (40 mg total) by mouth daily. 11/24/18   Charlott Rakes, MD  cetirizine (ZYRTEC ALLERGY) 10 MG tablet Take 1 tablet (10 mg total) by mouth daily. 04/09/19   Raylene Everts, MD  cyclobenzaprine (FLEXERIL) 10 MG tablet Take 1 tablet (10 mg total) by mouth 2 (two) times daily as needed for muscle spasms. 11/24/18   Charlott Rakes,  MD  dicyclomine (BENTYL) 10 MG capsule Take 1 capsule (10 mg total) by mouth 3 (three) times daily before meals. 11/24/18   Charlott Rakes, MD  hydrochlorothiazide (HYDRODIURIL) 25 MG tablet Take 1 tablet (25 mg total) by mouth daily. 11/24/18   Charlott Rakes, MD  hydrOXYzine (ATARAX/VISTARIL) 25 MG tablet Take 1 tablet (25 mg total) by mouth 3 (three) times daily as needed. 11/24/18   Charlott Rakes, MD  ibuprofen (ADVIL) 600 MG tablet Take 1 tablet (600 mg total) by mouth 2 (two) times daily as needed. 11/24/18   Charlott Rakes, MD  potassium chloride (KLOR-CON 10) 10 MEQ tablet Take 1 tablet (10 mEq total) by mouth daily. 11/24/18   Charlott Rakes, MD  sertraline (ZOLOFT) 50 MG tablet Take 1 tablet (50 mg total) by mouth daily. 11/24/18   Charlott Rakes, MD  triamcinolone (NASACORT) 55 MCG/ACT AERO nasal inhaler Place 2 sprays into the nose daily. 04/09/19   Raylene Everts, MD    Family History Family History  Problem Relation Age of Onset  . Cancer Paternal Grandfather  prostate  . Hypertension Paternal Grandfather   . Breast cancer Mother 26  . Thyroid disease Mother   . Diabetes Father   . Breast cancer Maternal Aunt 15       died from cancer    Social History Social History   Tobacco Use  . Smoking status: Never Smoker  . Smokeless tobacco: Never Used  Substance Use Topics  . Alcohol use: No  . Drug use: No     Allergies   Patient has no known allergies.   Review of Systems Review of Systems  Constitutional: Negative for chills and fever.  HENT: Positive for congestion, ear discharge, ear pain, postnasal drip, rhinorrhea and sinus pressure. Negative for hearing loss and sore throat.   Respiratory: Negative for cough and shortness of breath.      Physical Exam Triage Vital Signs ED Triage Vitals  Enc Vitals Group     BP 04/09/19 1015 (!) 138/97     Pulse Rate 04/09/19 1015 90     Resp 04/09/19 1015 16     Temp 04/09/19 1015 98.4 F (36.9 C)      Temp Source 04/09/19 1015 Oral     SpO2 04/09/19 1015 95 %     Weight --      Height --      Head Circumference --      Peak Flow --      Pain Score 04/09/19 1017 6     Pain Loc --      Pain Edu? --      Excl. in [place]? --    No data found.  Updated Vital Signs BP (!) 138/97 (BP Location: Right Arm)   Pulse 90   Temp 98.4 F (36.9 C) (Oral)   Resp 16   SpO2 95%       Physical Exam Constitutional:      General: She is not in acute distress.    Appearance: Normal appearance. She is well-developed and normal weight.     Comments: Pleasant and cooperative  HENT:     Head: Normocephalic and atraumatic.     Right Ear: Ear canal and external ear normal.     Left Ear: Tympanic membrane, ear canal and external ear normal.     Ears:     Comments: Clear fluid distorting right TM.  No perforation.    Nose: Congestion and rhinorrhea present.     Comments: Clear rhinorrhea    Mouth/Throat:     Mouth: Mucous membranes are moist.     Comments: Mask in place Eyes:     Conjunctiva/sclera: Conjunctivae normal.     Pupils: Pupils are equal, round, and reactive to light.  Cardiovascular:     Rate and Rhythm: Normal rate.  Pulmonary:     Effort: Pulmonary effort is normal. No respiratory distress.  Abdominal:     General: There is no distension.     Palpations: Abdomen is soft.  Musculoskeletal:        General: Normal range of motion.     Cervical back: Normal range of motion.  Skin:    General: Skin is warm and dry.  Neurological:     Mental Status: She is alert.  Psychiatric:        Mood and Affect: Mood normal.        Behavior: Behavior normal.      UC Treatments / Results  Labs (all labs ordered are listed, but only abnormal results are displayed) Labs Reviewed  NOVEL CORONAVIRUS, NAA (HOSP ORDER, SEND-OUT TO REF LAB; TAT 18-24 HRS)    EKG   Radiology No results found.  Procedures Procedures (including critical care time)  Medications Ordered in  UC Medications - No data to display  Initial Impression / Assessment and Plan / UC Course  I have reviewed the triage vital signs and the nursing notes.  Pertinent labs & imaging results that were available during my care of the patient were reviewed by me and considered in my medical decision making (see chart for details).     Reviewed with patient that there is no need for antibiotics.  Would use a steroid nasal spray, push fluids, try Zyrtec daily. Final Clinical Impressions(s) / UC Diagnoses   Final diagnoses:  Non-recurrent acute serous otitis media of right ear  Allergic rhinitis, unspecified seasonality, unspecified trigger     Discharge Instructions     Drink plenty of fluids Take the zyrtec ( cetirizine ) daily  Use the nasal spray daily See your physician if the pain/symptoms fails to improve    ED Prescriptions    Medication Sig Dispense Auth. Provider   triamcinolone (NASACORT) 55 MCG/ACT AERO nasal inhaler Place 2 sprays into the nose daily. 1 Inhaler Raylene Everts, MD   cetirizine (ZYRTEC ALLERGY) 10 MG tablet Take 1 tablet (10 mg total) by mouth daily. 30 tablet Raylene Everts, MD     PDMP not reviewed this encounter.   Raylene Everts, MD 04/09/19 1052

## 2019-04-09 NOTE — ED Notes (Signed)
Patient requesting covid test despite understanding she has no sx or exposures.

## 2019-04-09 NOTE — ED Triage Notes (Signed)
Patient presents to Urgent Care with complaints of right ear pain since 3-4 days ago. Patient reports it is tender to the touch, has been taking sinus medication hoping it would help but she thinks it made it worse.

## 2019-04-09 NOTE — Discharge Instructions (Signed)
Drink plenty of fluids Take the zyrtec ( cetirizine ) daily  Use the nasal spray daily See your physician if the pain/symptoms fails to improve

## 2019-04-12 LAB — NOVEL CORONAVIRUS, NAA (HOSP ORDER, SEND-OUT TO REF LAB; TAT 18-24 HRS): SARS-CoV-2, NAA: NOT DETECTED

## 2019-05-17 MED FILL — CYCLOBENZAPRINE 10 MG TAB: 10 | 30 days supply | Qty: 60 | Fill #1

## 2019-05-27 ENCOUNTER — Other Ambulatory Visit: Payer: Self-pay

## 2019-05-27 ENCOUNTER — Encounter: Payer: Self-pay | Admitting: Nurse Practitioner

## 2019-05-27 ENCOUNTER — Other Ambulatory Visit: Payer: Self-pay | Admitting: Physician Assistant

## 2019-05-27 ENCOUNTER — Ambulatory Visit: Payer: 59 | Attending: Family Medicine | Admitting: Physician Assistant

## 2019-05-27 DIAGNOSIS — R140 Abdominal distension (gaseous): Secondary | ICD-10-CM

## 2019-05-27 DIAGNOSIS — K580 Irritable bowel syndrome with diarrhea: Secondary | ICD-10-CM

## 2019-05-27 DIAGNOSIS — R110 Nausea: Secondary | ICD-10-CM

## 2019-05-27 MED ORDER — OMEPRAZOLE 20 MG PO CPDR: 20.0000 mg | DELAYED_RELEASE_CAPSULE | Freq: Every day | ORAL | 3 refills | Status: DC

## 2019-05-27 MED ORDER — ONDANSETRON 4 MG PO TBDP: 4.0000 mg | ORAL_TABLET | Freq: Three times a day (TID) | ORAL | 0 refills | Status: DC | PRN

## 2019-05-27 MED FILL — ?OMEPRAZOLE 20 MG CAP DR: 20 | 30 days supply | Qty: 30 | Fill #0

## 2019-05-27 MED FILL — ONDANSETRON ODT 4 MG TABLET: 4 | 6 days supply | Qty: 20 | Fill #0

## 2019-05-27 NOTE — Patient Instructions (Signed)
I sent omeprazole and Zofran to your pharmacy.  Please start the omeprazole today and then take it again in the morning at least 20 minutes prior to anything to eat or drink.  You will continue to use this in the morning.  I ordered labs to be completed to check for a metabolic disorder, and stomach infection.  You may also want to add a probiotic to your regimen, you can purchase this over-the-counter.  This can be very helpful for overall gut health.  I started a referral for you to be evaluated by gastroenterology.   Abdominal Bloating When you have abdominal bloating, your abdomen may feel full, tight, or painful. It may also look bigger than normal or swollen (distended). Common causes of abdominal bloating include:  Swallowing air.  Constipation.  Problems digesting food.  Eating too much.  Irritable bowel syndrome. This is a condition that affects the large intestine.  Lactose intolerance. This is an inability to digest lactose, a natural sugar in dairy products.  Celiac disease. This is a condition that affects the ability to digest gluten, a protein found in some grains.  Gastroparesis. This is a condition that slows down the movement of food in the stomach and small intestine. It is more common in people with diabetes mellitus.  Gastroesophageal reflux disease (GERD). This is a digestive condition that makes stomach acid flow back into the esophagus.  Urinary retention. This means that the body is holding onto urine, and the bladder cannot be emptied all the way. Follow these instructions at home: Eating and drinking  Avoid eating too much.  Try not to swallow air while talking or eating.  Avoid eating while lying down.  Avoid these foods and drinks: ? Foods that cause gas, such as broccoli, cabbage, cauliflower, and baked beans. ? Carbonated drinks. ? Hard candy. ? Chewing gum. Medicines  Take over-the-counter and prescription medicines only as told by your  health care provider.  Take probiotic medicines. These medicines contain live bacteria or yeasts that can help digestion.  Take coated peppermint oil capsules. Activity  Try to exercise regularly. Exercise may help to relieve bloating that is caused by gas and relieve constipation. General instructions  Keep all follow-up visits as told by your health care provider. This is important. Contact a health care provider if:  You have nausea and vomiting.  You have diarrhea.  You have abdominal pain.  You have unusual weight loss or weight gain.  You have severe pain, and medicines do not help. Get help right away if:  You have severe chest pain.  You have trouble breathing.  You have shortness of breath.  You have trouble urinating.  You have darker urine than normal.  You have blood in your stools or have dark, tarry stools. Summary  Abdominal bloating means that the abdomen is swollen.  Common causes of abdominal bloating are swallowing air, constipation, and problems digesting food.  Avoid eating too much and avoid swallowing air.  Avoid foods that cause gas, carbonated drinks, hard candy, and chewing gum. This information is not intended to replace advice given to you by your health care provider. Make sure you discuss any questions you have with your health care provider. Document Revised: 05/25/2018 Document Reviewed: 03/08/2016 Elsevier Patient Education  [employer].

## 2019-05-27 NOTE — Progress Notes (Signed)
Name and DOB reviewed  C /o light mid chest pressure, bloating, stomach aching sharp pain X few weeks on and off , pian level 7/10. Able to pass gas. "Pt states she cannot eat". Regular bowel movement . Last bowel movement this morning

## 2019-05-27 NOTE — Addendum Note (Signed)
Addended bySteffanie Dunn on: 05/27/2019 03:30 PM   Modules accepted: Orders

## 2019-05-27 NOTE — Progress Notes (Signed)
Established Patient Office Visit  Subjective:  Patient ID: Brandy [name], female    DOB: 1970/07/18  Age: 49 y.o. MRN: [id number]  CC:  Chief Complaint  Patient presents with  . Abdominal Pain    Virtual Visit via Telephone Note  I connected with Brandy [name] on 05/27/19 at  1:30 PM EDT by telephone and verified that I am speaking with the correct person using two identifiers.  Location: Patient: [facility]   I discussed the limitations, risks, security and privacy concerns of performing an evaluation and management service by telephone and the availability of in person appointments. I also discussed with the patient that there may be a patient responsible charge related to this service. The patient expressed understanding and agreed to proceed.   History of Present Illness: Reports that she has had abdominal bloating, nausea and  anorexia for the past 2 weeks.  Reports that her appetite is drastically reduced, states that she become full  quickly.  Reports that if she continues to eat she will have nausea, denies vomiting.  Reports that if she drinks water she also becomes very nauseous.  Denies any new stressors, continues to have normal every day soft formed bowel movements. Has not tried anything other than ginger ale for relief, ginger ale not offering much relief.    Denies heartburn, acid reflux.  Reports that she has had an increase in back pain, states this is chronic, states that she uses Flexeril and ibuprofen with some relief.  Reports that she uses the ibuprofen approximately twice a week.  Reports the back pain has made it more difficult for her to sleep recently, states that she is able to sleep approximately 2 hours at a time, is getting approximately 4 to 6 hours of rest at night.  Denies any new stressors.    Observations/Objective: Patient's chart, medication and recent labs are reviewed.  No physical  exam completed   Past Medical History:  Diagnosis Date  . Arthritis   . Disc degeneration, lumbar 2011  . Hypertension     Past Surgical History:  Procedure Laterality Date  . TUBAL LIGATION      Family History  Problem Relation Age of Onset  . Cancer Paternal Grandfather        prostate  . Hypertension Paternal Grandfather   . Breast cancer Mother 69  . Thyroid disease Mother   . Diabetes Father   . Breast cancer Maternal Aunt 3       died from cancer    Social History   Socioeconomic History  . Marital status: Divorced    Spouse name: Not on file  . Number of children: Not on file  . Years of education: Not on file  . Highest education level: 12th grade  Occupational History  . Not on file  Tobacco Use  . Smoking status: Never Smoker  . Smokeless tobacco: Never Used  Substance and Sexual Activity  . Alcohol use: No  . Drug use: No  . Sexual activity: Yes    Partners: Male    Birth control/protection: Surgical    Comment: BTL  Other Topics Concern  . Not on file  Social History Narrative  . Not on file   Social Determinants of Health   Financial Resource Strain:   . Difficulty of Paying Living Expenses:   Food Insecurity:   . Worried About Charity fundraiser in the Last Year:   . Ran  Out of Food in the Last Year:   Transportation Needs: No Transportation Needs  . Lack of Transportation (Medical): No  . Lack of Transportation (Non-Medical): No  Physical Activity:   . Days of Exercise per Week:   . Minutes of Exercise per Session:   Stress:   . Feeling of Stress :   Social Connections:   . Frequency of Communication with Friends and Family:   . Frequency of Social Gatherings with Friends and Family:   . Attends Religious Services:   . Active Member of Clubs or Organizations:   . Attends Archivist Meetings:   Marland Kitchen Marital Status:   Intimate Partner Violence:   . Fear of Current or Ex-Partner:   . Emotionally Abused:   Marland Kitchen Physically  Abused:   . Sexually Abused:     Outpatient Medications Prior to Visit  Medication Sig Dispense Refill  . amLODipine (NORVASC) 2.5 MG tablet Take 1 tablet (2.5 mg total) by mouth daily. 30 tablet 6  . cetirizine (ZYRTEC ALLERGY) 10 MG tablet Take 1 tablet (10 mg total) by mouth daily. 30 tablet 0  . cyclobenzaprine (FLEXERIL) 10 MG tablet Take 1 tablet (10 mg total) by mouth 2 (two) times daily as needed for muscle spasms. 60 tablet 2  . hydrochlorothiazide (HYDRODIURIL) 25 MG tablet Take 1 tablet (25 mg total) by mouth daily. 30 tablet 6  . hydrOXYzine (ATARAX/VISTARIL) 25 MG tablet Take 1 tablet (25 mg total) by mouth 3 (three) times daily as needed. 60 tablet 3  . ibuprofen (ADVIL) 600 MG tablet Take 1 tablet (600 mg total) by mouth 2 (two) times daily as needed. 60 tablet 3  . atorvastatin (LIPITOR) 40 MG tablet Take 1 tablet (40 mg total) by mouth daily. (Patient not taking: Reported on 05/27/2019) 30 tablet 6  . dicyclomine (BENTYL) 10 MG capsule Take 1 capsule (10 mg total) by mouth 3 (three) times daily before meals. (Patient not taking: Reported on 05/27/2019) 90 capsule 3  . potassium chloride (KLOR-CON 10) 10 MEQ tablet Take 1 tablet (10 mEq total) by mouth daily. (Patient not taking: Reported on 05/27/2019) 30 tablet 6  . sertraline (ZOLOFT) 50 MG tablet Take 1 tablet (50 mg total) by mouth daily. (Patient not taking: Reported on 05/27/2019) 30 tablet 6  . triamcinolone (NASACORT) 55 MCG/ACT AERO nasal inhaler Place 2 sprays into the nose daily. (Patient not taking: Reported on 05/27/2019) 1 Inhaler 0   No facility-administered medications prior to visit.    No Known Allergies  ROS Review of Systems  Constitutional: Negative for chills, fatigue and fever.  HENT: Negative.   Eyes: Negative.   Respiratory: Negative for cough and shortness of breath.   Cardiovascular: Negative for chest pain and palpitations.  Gastrointestinal: Positive for abdominal distention, abdominal pain and  nausea. Negative for blood in stool, constipation, diarrhea and vomiting.  Endocrine: Negative.   Genitourinary: Negative.   Musculoskeletal: Negative.   Skin: Negative.   Allergic/Immunologic: Negative.   Neurological: Negative.   Hematological: Negative.   Psychiatric/Behavioral: Positive for sleep disturbance.      Objective:        Wt Readings from Last 3 Encounters:  01/19/19 170 lb (77.1 kg)  11/24/18 168 lb 3.2 oz (76.3 kg)  08/19/18 161 lb 9.6 oz (73.3 kg)     There are no preventive care reminders to display for this patient.  There are no preventive care reminders to display for this patient.  Lab Results  Component Value Date  TSH 1.200 06/04/2016   Lab Results  Component Value Date   WBC 7.8 06/26/2016   HGB 15.8 06/26/2016   HCT 45.7 06/26/2016   MCV 88 06/26/2016   PLT 265 06/26/2016   Lab Results  Component Value Date   NA 143 08/19/2018   K 3.2 (L) 08/19/2018   CO2 24 08/19/2018   GLUCOSE 83 08/19/2018   BUN 14 08/19/2018   CREATININE 0.88 08/19/2018   BILITOT 0.4 08/19/2018   ALKPHOS 72 08/19/2018   AST 15 08/19/2018   ALT 17 08/19/2018   PROT 7.6 08/19/2018   ALBUMIN 4.7 08/19/2018   CALCIUM 9.6 08/19/2018   ANIONGAP 13 05/14/2016   Lab Results  Component Value Date   CHOL 253 (H) 08/19/2018   Lab Results  Component Value Date   HDL 50 08/19/2018   Lab Results  Component Value Date   LDLCALC 162 (H) 08/19/2018   Lab Results  Component Value Date   TRIG 204 (H) 08/19/2018   Lab Results  Component Value Date   CHOLHDL 5.1 (H) 08/19/2018   Lab Results  Component Value Date   HGBA1C 5.4 09/10/2017      Assessment & Plan:   Problem List Items Addressed This Visit      Digestive   Irritable bowel syndrome (IBS) - Primary   Relevant Medications   omeprazole (PRILOSEC) 20 MG capsule   ondansetron (ZOFRAN ODT) 4 MG disintegrating tablet   Other Relevant Orders   Ambulatory referral to Gastroenterology    Other  Visit Diagnoses    Abdominal bloating       Relevant Medications   omeprazole (PRILOSEC) 20 MG capsule   Other Relevant Orders   Ambulatory referral to Gastroenterology   H Pylori, IGM, IGG, IGA AB   Comp. Metabolic Panel (12)   Nausea       Relevant Medications   ondansetron (ZOFRAN ODT) 4 MG disintegrating tablet   Other Relevant Orders   H Pylori, IGM, IGG, IGA AB   Comp. Metabolic Panel (12)    1. Irritable bowel syndrome with diarrhea Patient previously diagnosed with irritable bowel syndrome with diarrhea, is not currently having diarrhea, will begin referral to gastroenterology for further evaluation of irritable bowel, abdominal bloating.  Encouraged patient to increase hydration, use Zofran for relief of nausea.  Trial of omeprazole, patient will come into the office this afternoon to complete labs to rule out metabolic disorder, H. pylori.   - Ambulatory referral to Gastroenterology  2. Abdominal bloating  - omeprazole (PRILOSEC) 20 MG capsule; Take 1 capsule (20 mg total) by mouth daily.  Dispense: 30 capsule; Refill: 3 - Ambulatory referral to Gastroenterology - H Pylori, IGM, IGG, IGA AB; Future - Comp. Metabolic Panel (12); Future  3. Nausea  - ondansetron (ZOFRAN ODT) 4 MG disintegrating tablet; Take 1 tablet (4 mg total) by mouth every 8 (eight) hours as needed for nausea or vomiting.  Dispense: 20 tablet; Refill: 0 - H Pylori, IGM, IGG, IGA AB; Future - Comp. Metabolic Panel (12); Future   Meds ordered this encounter  Medications  . omeprazole (PRILOSEC) 20 MG capsule    Sig: Take 1 capsule (20 mg total) by mouth daily.    Dispense:  30 capsule    Refill:  3    Order Specific Question:   Supervising Provider    Answer:   Joya Gaskins, PATRICK E [[id number]]  . ondansetron (ZOFRAN ODT) 4 MG disintegrating tablet    Sig: Take 1 tablet (4 mg  total) by mouth every 8 (eight) hours as needed for nausea or vomiting.    Dispense:  20 tablet    Refill:  0    Order  Specific Question:   Supervising Provider    Answer:   Asencion Noble E [[id number]]   Follow Up Instructions:    I discussed the assessment and treatment plan with the patient. The patient was provided an opportunity to ask questions and all were answered. The patient agreed with the plan and demonstrated an understanding of the instructions.   The patient was advised to call back or seek an in-person evaluation if the symptoms worsen or if the condition fails to improve as anticipated.  I provided 25 minutes of non-face-to-face time during this encounter.   Alfonzo Arca S Mayers, PA-C    Follow-up: Return in about 4 weeks (around 06/24/2019) for with Dr. Margarita Rana.    Loraine Grip Mayers, PA-C

## 2019-05-29 LAB — COMP. METABOLIC PANEL (12)
AST: 14 IU/L (ref 0–40)
Albumin/Globulin Ratio: 1.6 (ref 1.2–2.2)
Albumin: 4.2 g/dL (ref 3.8–4.8)
Alkaline Phosphatase: 102 IU/L (ref 39–117)
BUN/Creatinine Ratio: 13 (ref 9–23)
BUN: 12 mg/dL (ref 6–24)
Bilirubin Total: 0.3 mg/dL (ref 0.0–1.2)
Calcium: 9.4 mg/dL (ref 8.7–10.2)
Chloride: 102 mmol/L (ref 96–106)
Creatinine, Ser: 0.92 mg/dL (ref 0.57–1.00)
GFR calc Af Amer: 85 mL/min/{1.73_m2} (ref >59)
GFR calc non Af Amer: 74 mL/min/{1.73_m2} (ref >59)
Globulin, Total: 2.6 g/dL (ref 1.5–4.5)
Glucose: 73 mg/dL (ref 65–99)
Potassium: 3.7 mmol/L (ref 3.5–5.2)
Sodium: 143 mmol/L (ref 134–144)
Total Protein: 6.8 g/dL (ref 6.0–8.5)

## 2019-05-29 LAB — H PYLORI, IGM, IGG, IGA AB
H pylori, IgM Abs: <9 units (ref 0.0–8.9)
H. pylori, IgA Abs: <9 units (ref 0.0–8.9)
H. pylori, IgG AbS: 0.18 Index Value (ref 0.00–0.79)

## 2019-06-02 ENCOUNTER — Telehealth: Payer: Self-pay | Admitting: *Deleted

## 2019-06-02 NOTE — Telephone Encounter (Signed)
Patient verified DOB Patient is aware of labs being normal and negative. Patient advised to keep gastro appointment.

## 2019-06-07 MED FILL — AMLODIPINE 2.5 MG TABLET: 2.5 | 30 days supply | Qty: 30 | Fill #5

## 2019-06-07 MED FILL — HYDROCHLOROTHIAZIDE 25 MG T: 25 | 30 days supply | Qty: 30 | Fill #5

## 2019-06-09 ENCOUNTER — Encounter: Payer: Self-pay | Admitting: Nurse Practitioner

## 2019-06-09 ENCOUNTER — Other Ambulatory Visit (INDEPENDENT_AMBULATORY_CARE_PROVIDER_SITE_OTHER): Payer: 59

## 2019-06-09 ENCOUNTER — Ambulatory Visit (INDEPENDENT_AMBULATORY_CARE_PROVIDER_SITE_OTHER): Payer: 59 | Admitting: Nurse Practitioner

## 2019-06-09 VITALS — BP 134/100 | HR 80 | Temp 98.3°F | Ht 66.75 in | Wt 169.2 lb

## 2019-06-09 DIAGNOSIS — R197 Diarrhea, unspecified: Secondary | ICD-10-CM

## 2019-06-09 DIAGNOSIS — R1084 Generalized abdominal pain: Secondary | ICD-10-CM | POA: Diagnosis not present

## 2019-06-09 DIAGNOSIS — R1111 Vomiting without nausea: Secondary | ICD-10-CM

## 2019-06-09 DIAGNOSIS — R1010 Upper abdominal pain, unspecified: Secondary | ICD-10-CM

## 2019-06-09 DIAGNOSIS — R112 Nausea with vomiting, unspecified: Secondary | ICD-10-CM | POA: Insufficient documentation

## 2019-06-09 LAB — C-REACTIVE PROTEIN: CRP: <1 mg/dL (ref 0.5–20.0)

## 2019-06-09 LAB — LIPASE: Lipase: 15 U/L (ref 11.0–59.0)

## 2019-06-09 NOTE — Progress Notes (Signed)
06/09/2019 [name] [id number] 05/30/1970   CHIEF COMPLAINT: abdominal bloat, fullness   HISTORY OF PRESENT ILLNESS:  Brandy [name] is a 49 year old female with a past medical history of anxiety, depression, arthritis and hypertension. S/P tubal ligation in 2003.   She presents today valuation regarding episodes of generalized abdominal cramping with nausea/vomiting and diarrhea.  She reports having 5-6 episodes over the past 8 weeks.  She first develops abdominal pain with the abrupt onset of nausea/vomiting and diarrhea.  Her emesis consists of partially digested food.  She continues to vomit until her stomach completely empties to the point where she is having just dry heaves.  No hematemesis.  These episodes last 1 to 2 hours.  She feels slightly flushed during these episodes without associated sweats.  She passes a normal formed brown bowel movements most days.  No rectal bleeding or melena.  She reports losing 5 pounds since January 2021.  She denies any specific food triggers for these attacks.  However, she reports having IBS symptoms with mild abdominal discomfort and loose stools if she eats spaghetti, onions or [place] food.  She denies having any dysphagia or heartburn.  She is taking omeprazole 20 mg daily.  She takes dicyclomine 1 tab before each meal for the past year which has reduced her IBS symptoms.  Family history of esophageal, gastric or colorectal cancer.  No other complaints today.  Labs 05/27/2018: H. pylori IgG, IgA and IgM antibodies negative.  CMP was normal.   Past Medical History:  Diagnosis Date  . Anxiety   . Arthritis   . Depression   . Disc degeneration, lumbar 2011  . Hypertension   . IBS (irritable bowel syndrome)    Past Surgical History:  Procedure Laterality Date  . TUBAL LIGATION      Social History: She is divorced. She is a Chartered certified accountant. She has  twin sons age 32 and 42 daughter age 43. Nonsmoker. No alcohol. No drug use.    Family history: Paternal grandfather had prostate cancer.  Mother and maternal aunt with  Breast cancer. Father DM and HTN.  Paternal grandfather had prostate cancer.   Outpatient Encounter Medications as of 06/09/2019  Medication Sig  . amLODipine (NORVASC) 2.5 MG tablet Take 1 tablet (2.5 mg total) by mouth daily.  . cyclobenzaprine (FLEXERIL) 10 MG tablet Take 1 tablet (10 mg total) by mouth 2 (two) times daily as needed for muscle spasms.  Marland Kitchen dicyclomine (BENTYL) 10 MG capsule Take 1 capsule (10 mg total) by mouth 3 (three) times daily before meals.  . hydrochlorothiazide (HYDRODIURIL) 25 MG tablet Take 1 tablet (25 mg total) by mouth daily.  Marland Kitchen ibuprofen (ADVIL) 600 MG tablet Take 1 tablet (600 mg total) by mouth 2 (two) times daily as needed.  Marland Kitchen omeprazole (PRILOSEC) 20 MG capsule Take 1 capsule (20 mg total) by mouth daily.  Marland Kitchen triamcinolone (NASACORT) 55 MCG/ACT AERO nasal inhaler Place 2 sprays into the nose daily.  . ondansetron (ZOFRAN ODT) 4 MG disintegrating tablet Take 1 tablet (4 mg total) by mouth every 8 (eight) hours as needed for nausea or vomiting. (Patient not taking: Reported on 06/09/2019)  . [DISCONTINUED] atorvastatin (LIPITOR) 40 MG tablet Take 1 tablet (40 mg total) by mouth daily. (Patient not taking: Reported on 05/27/2019)  . [DISCONTINUED] cetirizine (ZYRTEC ALLERGY) 10 MG tablet Take 1 tablet (10 mg total) by mouth daily.  . [DISCONTINUED] hydrOXYzine (ATARAX/VISTARIL) 25 MG tablet Take 1 tablet (25  mg total) by mouth 3 (three) times daily as needed.  . [DISCONTINUED] potassium chloride (KLOR-CON 10) 10 MEQ tablet Take 1 tablet (10 mEq total) by mouth daily. (Patient not taking: Reported on 05/27/2019)  . [DISCONTINUED] sertraline (ZOLOFT) 50 MG tablet Take 1 tablet (50 mg total) by mouth daily. (Patient not taking: Reported on 05/27/2019)   No facility-administered encounter medications on file as of 06/09/2019.     REVIEW OF SYSTEMS: All other systems reviewed and  negative except where noted in the History of Present Illness.   PHYSICAL EXAM: BP (!) 134/100 (BP Location: Left Arm, Patient Position: Sitting, Cuff Size: Normal)   Pulse 80   Temp 98.3 F (36.8 C)   Ht 5' 6.75" (1.695 m) Comment: height measured without shoes  Wt 169 lb 4 oz (76.8 kg)   BMI 26.71 kg/m  General: Well developed 49 year old female in no acute distress. Head: Normocephalic and atraumatic. Eyes:  Sclerae non-icteric, conjunctive pink. Ears: Normal auditory acuity. Mouth: Dentition intact. No ulcers or lesions.  Neck: Supple, no lymphadenopathy or thyromegaly.  Lungs: Clear bilaterally to auscultation without wheezes, crackles or rhonchi. Heart: Regular rate and rhythm. No murmur, rub or gallop appreciated.  Abdomen: Soft, nontender, non distended. No masses. No hepatosplenomegaly. Normoactive bowel sounds x 4 quadrants.  Rectal: Deferred.  Musculoskeletal: Symmetrical with no gross deformities. Skin: Warm and dry. No rash or lesions on visible extremities. Extremities: No edema. Neurological: Alert oriented x 4, no focal deficits.  Psychological:  Alert and cooperative. Normal mood and affect.  ASSESSMENT AND PLAN:  77. 49 year old female with episodic generalized abdominal pain, nausea/vomiting/diarrhea -CBC, CMP, CRP, lipase, TTG and IgA -Abdominal/pelvic CT with oral and IV contrast -Eventual EGD and colonoscopy to be scheduled at the time of her follow-up appointment -Patient to call our office if her symptoms worsen, to the ED if she develops severe abdominal pain    CC:  Charlott Rakes, MD

## 2019-06-09 NOTE — Patient Instructions (Signed)
If you are age 49 or older, your body mass index should be between 23-30. Your Body mass index is 26.71 kg/m. If this is out of the aforementioned range listed, please consider follow up with your Primary Care Provider.  If you are age 1 or younger, your body mass index should be between 19-25. Your Body mass index is 26.71 kg/m. If this is out of the aformentioned range listed, please consider follow up with your Primary Care Provider.   Your provider has requested that you go to the basement level for lab work before leaving today. Press "B" on the elevator. The lab is located at the first door on the left as you exit the elevator.  Due to recent changes in healthcare laws, you may see the results of your imaging and laboratory studies on MyChart before your provider has had a chance to review them.  We understand that in some cases there may be results that are confusing or concerning to you. Not all laboratory results come back in the same time frame and the provider may be waiting for multiple results in order to interpret others.  Please give [place] 48 hours in order for your provider to thoroughly review all the results before contacting the office for clarification of your results.   Thank you for choosing [facility] Noralyn Pick, CRNP

## 2019-06-10 LAB — CBC WITH DIFFERENTIAL/PLATELET
Basophils Absolute: 0.1 10*3/uL (ref 0.0–0.1)
Basophils Relative: 0.9 % (ref 0.0–3.0)
Eosinophils Absolute: 0 10*3/uL (ref 0.0–0.7)
Eosinophils Relative: 0.5 % (ref 0.0–5.0)
HCT: 45.5 % (ref 36.0–46.0)
Hemoglobin: 15.1 g/dL — ABNORMAL HIGH (ref 12.0–15.0)
Lymphocytes Relative: 22.8 % (ref 12.0–46.0)
Lymphs Abs: 1.5 10*3/uL (ref 0.7–4.0)
MCHC: 33.3 g/dL (ref 30.0–36.0)
MCV: 91.7 fl (ref 78.0–100.0)
Monocytes Absolute: 0.4 10*3/uL (ref 0.1–1.0)
Monocytes Relative: 5.5 % (ref 3.0–12.0)
Neutro Abs: 4.6 10*3/uL (ref 1.4–7.7)
Neutrophils Relative %: 70.3 % (ref 43.0–77.0)
Platelets: 309 10*3/uL (ref 150.0–400.0)
RBC: 4.97 Mil/uL (ref 3.87–5.11)
RDW: 15.1 % (ref 11.5–15.5)
WBC: 6.6 10*3/uL (ref 4.0–10.5)

## 2019-06-19 LAB — TISSUE TRANSGLUTAMINASE, IGA: (tTG) Ab, IgA: 1 U/mL

## 2019-06-19 LAB — GLIADIN ANTIBODIES, SERUM
Gliadin IgA: 7 Units
Gliadin IgG: 2 Units

## 2019-06-19 LAB — RETICULIN ANTIBODIES, IGA W TITER: Reticulin IGA Screen: NEGATIVE

## 2019-06-22 NOTE — Progress Notes (Signed)
Reviewed and agree with documentation and assessment and plan. K. Veena Nandigam , MD   

## 2019-06-28 ENCOUNTER — Telehealth: Payer: Self-pay | Admitting: Nurse Practitioner

## 2019-06-28 ENCOUNTER — Other Ambulatory Visit: Payer: Self-pay

## 2019-06-28 DIAGNOSIS — R197 Diarrhea, unspecified: Secondary | ICD-10-CM

## 2019-06-28 DIAGNOSIS — R1084 Generalized abdominal pain: Secondary | ICD-10-CM

## 2019-06-28 DIAGNOSIS — R112 Nausea with vomiting, unspecified: Secondary | ICD-10-CM

## 2019-06-28 NOTE — Telephone Encounter (Signed)
Pt just cancelled her ov that was scheduled for 5/12. She states that appt was to f/u after Ct scan. However, pt states that nobody from the office called her to schedule ct scan. Pls call her to schedule one.

## 2019-06-28 NOTE — Telephone Encounter (Signed)
Pls cal pt. She states that her last visit summary states that she needs to have a ct scan so she would like to schedule one.

## 2019-06-30 ENCOUNTER — Ambulatory Visit: Payer: 59 | Admitting: Nurse Practitioner

## 2019-07-02 ENCOUNTER — Ambulatory Visit (INDEPENDENT_AMBULATORY_CARE_PROVIDER_SITE_OTHER)
Admission: RE | Admit: 2019-07-02 | Discharge: 2019-07-02 | Disposition: A | Discharge status: Home / Self Care | Payer: 59 | Source: Ambulatory Visit | Attending: Nurse Practitioner | Admitting: Nurse Practitioner

## 2019-07-02 ENCOUNTER — Other Ambulatory Visit: Payer: Self-pay

## 2019-07-02 DIAGNOSIS — R197 Diarrhea, unspecified: Secondary | ICD-10-CM

## 2019-07-02 DIAGNOSIS — R1084 Generalized abdominal pain: Secondary | ICD-10-CM

## 2019-07-02 DIAGNOSIS — R112 Nausea with vomiting, unspecified: Secondary | ICD-10-CM | POA: Diagnosis not present

## 2019-07-02 MED ORDER — IOHEXOL 300 MG/ML  SOLN
100.0000 mL | Freq: Once | INTRAMUSCULAR | Status: AC | PRN
  Administered 2019-07-02: 100 mL via INTRAVENOUS

## 2019-07-14 MED FILL — AMLODIPINE 2.5 MG TABLET: 2.5 | 30 days supply | Qty: 30 | Fill #6

## 2019-07-14 MED FILL — HYDROCHLOROTHIAZIDE 25 MG T: 25 | 30 days supply | Qty: 30 | Fill #6

## 2019-09-14 ENCOUNTER — Ambulatory Visit: Payer: Self-pay

## 2019-09-14 NOTE — Telephone Encounter (Signed)
Patient called and says she's been having ankle swelling and abdominal swelling for the past 4 months off and on when riding or walking a lot. She says the swelling goes away when she props her feet up and stay off of them for a while. She says there is no pain, but the ankles are tender when she touches them when swollen. She says otherwise, the ankles and abdomen are fine. She denies SOB, calf pain, chest pain when there is swelling. She denies swelling at this time. She also says she would like to discuss her sciatica pain that was treated when she's in the office. The first available appointment scheduled for 11/01/19 at 1510 with Dr. Margarita Rana. She says if there are any earlier appointments with another provider, she will take that. No availability at this time. I advised I will send this to the office in case another appointment is available, someone will call to let her know. Care advice given to continue to prop her feet up when swelling occurs, go to UC or ED if she develops pain in the calf, ankles when swollen, difficulty breathing, chest pain; she verbalized understanding.  Answer Assessment - Initial Assessment Questions 1. LOCATION: "Which ankle is swollen?" "Where is the swelling?"     Both anklses 2. ONSET: "When did the swelling start?"     A couple of months ago when riding, walking a lot  3. SIZE: "How large is the swelling?"     Not swollen today; probably 2 times larger when it happens 4. PAIN: "Is there any pain?" If Yes, ask: "How bad is it?" (Scale 1-10; or mild, moderate, severe)   - NONE (0): no pain.   - MILD (1-3): doesn't interfere with normal activities.    - MODERATE (4-7): interferes with normal activities (e.g., work or school) or awakens from sleep, limping.    - SEVERE (8-10): excruciating pain, unable to do any normal activities, unable to walk.      When swollen, they are sore to touch, sometimes it's difficult to put shoes on due to the tenderness 5. CAUSE: "What do  you think caused the ankle swelling?"     I don't know 6. OTHER SYMPTOMS: "Do you have any other symptoms?" (e.g., fever, chest pain, difficulty breathing, calf pain)     No 7. PREGNANCY: "Is there any chance you are pregnant?" "When was your last menstrual period?"     No  Protocols used: ANKLE SWELLING-A-AH

## 2019-09-14 NOTE — Telephone Encounter (Signed)
FYI

## 2019-09-15 MED FILL — AMLODIPINE 2.5 MG TABLET: 2.5 | 30 days supply | Qty: 30 | Fill #0

## 2019-09-15 MED FILL — HYDROCHLOROTHIAZIDE 25 MG T: 25 | 30 days supply | Qty: 30 | Fill #0

## 2019-09-15 MED FILL — CYCLOBENZAPRINE 10 MG TAB: 10 | 30 days supply | Qty: 60 | Fill #2

## 2019-09-17 MED FILL — IBUPROFEN 600 MG TABLET: 600 | 30 days supply | Qty: 60 | Fill #2

## 2019-11-01 ENCOUNTER — Ambulatory Visit: Payer: 59 | Admitting: Family Medicine

## 2019-11-02 ENCOUNTER — Other Ambulatory Visit: Payer: Self-pay | Admitting: Family Medicine

## 2019-11-02 DIAGNOSIS — M5431 Sciatica, right side: Secondary | ICD-10-CM

## 2019-11-02 MED FILL — AMLODIPINE 2.5 MG TABLET: 2.5 | 30 days supply | Qty: 30 | Fill #1

## 2019-11-02 MED FILL — HYDROCHLOROTHIAZIDE 25 MG T: 25 | 30 days supply | Qty: 30 | Fill #1

## 2019-11-02 NOTE — Telephone Encounter (Signed)
Requested medication (s) are due for refill today: yes  Requested medication (s) are on the active medication list: yes  Last refill: 11/24/18  #60  2 refills  Future visit scheduled: No  Notes to clinic:  Med not delegated last seen for sciatica 11/24/18    Requested Prescriptions  Pending Prescriptions Disp Refills   cyclobenzaprine (FLEXERIL) 10 MG tablet [Pharmacy Med Name: CYCLOBENZAPRINE 10 MG TAB 10 Tablet] 60 tablet 2    Sig: Take 1 tablet (10 mg total) by mouth 2 (two) times daily as needed for muscle spasms.      Not Delegated - Analgesics:  Muscle Relaxants Failed - 11/02/2019  4:22 PM      Failed - This refill cannot be delegated      Passed - Valid encounter within last 6 months    Recent Outpatient Visits           5 months ago Irritable bowel syndrome with diarrhea   [place], [place]   11 months ago Sciatica of right side   [place], Charlane Ferretti, [place]   1 year ago Sciatica of right side   [facility] [place], Charlane Ferretti, MD   1 year ago Annual physical exam   [facility] Charlott Rakes, MD   2 years ago Ganglion cyst   [facility] Charlott Rakes, MD

## 2019-12-10 ENCOUNTER — Other Ambulatory Visit: Payer: Self-pay | Admitting: Family Medicine

## 2019-12-10 DIAGNOSIS — I10 Essential (primary) hypertension: Secondary | ICD-10-CM

## 2019-12-10 MED FILL — AMLODIPINE 2.5 MG TABLET: 2.5 | 30 days supply | Qty: 30 | Fill #0

## 2019-12-10 MED FILL — HYDROCHLOROTHIAZIDE 25 MG T: 25 | 30 days supply | Qty: 30 | Fill #0

## 2020-02-01 ENCOUNTER — Other Ambulatory Visit: Payer: Self-pay

## 2020-02-01 ENCOUNTER — Other Ambulatory Visit: Payer: Self-pay | Admitting: Family Medicine

## 2020-02-01 ENCOUNTER — Ambulatory Visit: Payer: Self-pay

## 2020-02-01 ENCOUNTER — Ambulatory Visit: Payer: 59 | Attending: Family Medicine | Admitting: Family Medicine

## 2020-02-01 VITALS — BP 134/97 | HR 89 | Ht 66.0 in | Wt 169.0 lb

## 2020-02-01 DIAGNOSIS — M5431 Sciatica, right side: Secondary | ICD-10-CM | POA: Diagnosis not present

## 2020-02-01 DIAGNOSIS — Z1159 Encounter for screening for other viral diseases: Secondary | ICD-10-CM

## 2020-02-01 DIAGNOSIS — I10 Essential (primary) hypertension: Secondary | ICD-10-CM

## 2020-02-01 DIAGNOSIS — R112 Nausea with vomiting, unspecified: Secondary | ICD-10-CM | POA: Diagnosis not present

## 2020-02-01 DIAGNOSIS — R0789 Other chest pain: Secondary | ICD-10-CM

## 2020-02-01 MED ORDER — ASPIRIN EC 81 MG PO TBEC: 81.0000 mg | DELAYED_RELEASE_TABLET | Freq: Every day | ORAL | 11 refills | Status: DC

## 2020-02-01 MED ORDER — IBUPROFEN 600 MG PO TABS: 600.0000 mg | ORAL_TABLET | Freq: Two times a day (BID) | ORAL | 3 refills | Status: DC | PRN

## 2020-02-01 MED ORDER — LOSARTAN POTASSIUM 50 MG PO TABS: 50.0000 mg | ORAL_TABLET | Freq: Every day | ORAL | 6 refills | Status: DC

## 2020-02-01 MED ORDER — CYCLOBENZAPRINE HCL 10 MG PO TABS: 10.0000 mg | ORAL_TABLET | Freq: Two times a day (BID) | ORAL | 2 refills | Status: DC | PRN

## 2020-02-01 MED ORDER — HYDROCHLOROTHIAZIDE 25 MG PO TABS: 25.0000 mg | ORAL_TABLET | Freq: Every day | ORAL | 6 refills | Status: DC

## 2020-02-01 MED ORDER — NITROGLYCERIN 0.4 MG SL SUBL: 0.4000 mg | SUBLINGUAL_TABLET | SUBLINGUAL | 1 refills | Status: DC | PRN

## 2020-02-01 NOTE — Progress Notes (Signed)
Chest pain for 2 months. States pain is behind her right breast.

## 2020-02-01 NOTE — Progress Notes (Signed)
Subjective:  Patient ID: Brandy [name], female    DOB: 1970-05-16  Age: 49 y.o. MRN: [id number]  CC: Chest Pain   HPI Brandy [name]  is a 49 year old female with a history of hypertension, hyperlipidemia anxiety and depressionhere for  She has swelling in her ankles but denies presence of dyspnea or weight gain.  Of note she is on amlodipine but is also on hydrochlorothiazide.  Has had right-sided chest pain for the last 2 months. Last episode of chest pain was 2 weeks ago rated as 9/10.  Denies radiation of pain, associated diaphoresis, palpitations or lightheadedness. She has been having worsening of her IBS with associated carmping, diarrhea , nasuea and vomiting. Uses Omeprazole prn. Last dose was 3 weeks ago. Currently on Bentyl which she uses prn rather than round-the-clock as she does not like to remain on medications chronically  Past Medical History:  Diagnosis Date  . Anxiety   . Arthritis   . Depression   . Disc degeneration, lumbar 2011  . Hypertension   . IBS (irritable bowel syndrome)     Past Surgical History:  Procedure Laterality Date  . TUBAL LIGATION      Family History  Problem Relation Age of Onset  . Hypertension Paternal Grandfather   . Prostate cancer Paternal Grandfather   . Breast cancer Mother 92  . Thyroid disease Mother   . Hyperlipidemia Mother   . Diabetes Father   . Hypertension Father        and aunt and uncles  . Thyroid disease Father   . Hyperlipidemia Father   . Breast cancer Maternal Aunt 43    No Known Allergies  Outpatient Medications Prior to Visit  Medication Sig Dispense Refill  . amLODipine (NORVASC) 2.5 MG tablet TAKE 1 TABLET (2.5 MG TOTAL) BY MOUTH DAILY. 30 tablet 0  . dicyclomine (BENTYL) 10 MG capsule Take 1 capsule (10 mg total) by mouth 3 (three) times daily before meals. 90 capsule 3  . omeprazole (PRILOSEC) 20 MG capsule Take 1 capsule (20 mg total) by mouth daily. 30 capsule 3  .  triamcinolone (NASACORT) 55 MCG/ACT AERO nasal inhaler Place 2 sprays into the nose daily. 1 Inhaler 0  . hydrochlorothiazide (HYDRODIURIL) 25 MG tablet TAKE 1 TABLET (25 MG TOTAL) BY MOUTH DAILY. 30 tablet 0  . ibuprofen (ADVIL) 600 MG tablet Take 1 tablet (600 mg total) by mouth 2 (two) times daily as needed. 60 tablet 3  . ondansetron (ZOFRAN ODT) 4 MG disintegrating tablet Take 1 tablet (4 mg total) by mouth every 8 (eight) hours as needed for nausea or vomiting. (Patient not taking: No sig reported) 20 tablet 0  . cyclobenzaprine (FLEXERIL) 10 MG tablet Take 1 tablet (10 mg total) by mouth 2 (two) times daily as needed for muscle spasms. (Patient not taking: Reported on 02/01/2020) 60 tablet 2   No facility-administered medications prior to visit.     ROS Review of Systems  Constitutional: Negative for activity change, appetite change and fatigue.  HENT: Negative for congestion, sinus pressure and sore throat.   Eyes: Negative for visual disturbance.  Respiratory: Positive for chest tightness. Negative for cough, shortness of breath and wheezing.   Cardiovascular: Positive for leg swelling. Negative for chest pain and palpitations.  Gastrointestinal: Negative for abdominal distention, abdominal pain and constipation.  Endocrine: Negative for polydipsia.  Genitourinary: Negative for dysuria and frequency.  Musculoskeletal: Negative for arthralgias and back pain.  Skin: Negative for rash.  Neurological: Negative  for tremors, light-headedness and numbness.  Hematological: Does not bruise/bleed easily.  Psychiatric/Behavioral: Negative for agitation and behavioral problems.    Objective:  BP (!) 134/97   Pulse 89   Ht 5\' 6"  (1.676 m)   Wt 169 lb (76.7 kg)   SpO2 96%   BMI 27.28 kg/m   BP/Weight 02/01/2020 06/09/2019 8/84/1660  Systolic BP 630 160 109  Diastolic BP 97 323 97  Wt. (Lbs) 169 169.25 -  BMI 27.28 26.71 -      Physical Exam Constitutional:      Appearance:  She is well-developed.  Neck:     Vascular: No JVD.  Cardiovascular:     Rate and Rhythm: Normal rate.     Heart sounds: Normal heart sounds. No murmur heard.   Pulmonary:     Effort: Pulmonary effort is normal.     Breath sounds: Normal breath sounds. No wheezing or rales.  Chest:     Chest wall: No tenderness.  Abdominal:     General: Bowel sounds are normal. There is no distension.     Palpations: Abdomen is soft. There is no mass.     Tenderness: There is no abdominal tenderness.  Musculoskeletal:        General: Normal range of motion.     Right lower leg: No edema.     Left lower leg: No edema.  Neurological:     Mental Status: She is alert and oriented to person, place, and time.  Psychiatric:        Mood and Affect: Mood normal.     CMP Latest Ref Rng & Units 05/27/2019 08/19/2018 06/24/2017  Glucose 65 - 99 mg/dL 73 83 88  BUN 6 - 24 mg/dL 12 14 14   Creatinine 0.57 - 1.00 mg/dL 0.92 0.88 0.95  Sodium 134 - 144 mmol/L 143 143 144  Potassium 3.5 - 5.2 mmol/L 3.7 3.2(L) 4.0  Chloride 96 - 106 mmol/L 102 99 102  CO2 20 - 29 mmol/L - 24 26  Calcium 8.7 - 10.2 mg/dL 9.4 9.6 9.8  Total Protein 6.0 - 8.5 g/dL 6.8 7.6 7.8  Total Bilirubin 0.0 - 1.2 mg/dL 0.3 0.4 0.3  Alkaline Phos 39 - 117 IU/L 102 72 85  AST 0 - 40 IU/L 14 15 18   ALT 0 - 32 IU/L - 17 22    Lipid Panel     Component Value Date/Time   CHOL 253 (H) 08/19/2018 1053   TRIG 204 (H) 08/19/2018 1053   HDL 50 08/19/2018 1053   CHOLHDL 5.1 (H) 08/19/2018 1053   CHOLHDL 4.2 09/10/2017 0959   VLDL 26 09/10/2017 0959   LDLCALC 162 (H) 08/19/2018 1053    CBC    Component Value Date/Time   WBC 6.6 06/09/2019 1232   RBC 4.97 06/09/2019 1232   HGB 15.1 (H) 06/09/2019 1232   HGB 15.8 06/26/2016 1623   HCT 45.5 06/09/2019 1232   HCT 45.7 06/26/2016 1623   PLT 309.0 06/09/2019 1232   PLT 265 06/26/2016 1623   MCV 91.7 06/09/2019 1232   MCV 88 06/26/2016 1623   MCH 30.3 06/26/2016 1623   MCH 30.6  05/14/2016 1418   MCHC 33.3 06/09/2019 1232   RDW 15.1 06/09/2019 1232   RDW 14.0 06/26/2016 1623   LYMPHSABS 1.5 06/09/2019 1232   LYMPHSABS 1.7 06/26/2016 1623   MONOABS 0.4 06/09/2019 1232   EOSABS 0.0 06/09/2019 1232   EOSABS 0.0 06/26/2016 1623   BASOSABS 0.1 06/09/2019 1232  BASOSABS 0.0 06/26/2016 1623    Lab Results  Component Value Date   HGBA1C 5.4 09/10/2017    Assessment & Plan:  1. Essential hypertension Controlled Switched from Amlodipine to Losartan due to pedal edema Counseled on blood pressure goal of less than 130/80, low-sodium, DASH diet, medication compliance, 150 minutes of moderate intensity exercise per week. Discussed medication compliance, adverse effects. - losartan (COZAAR) 50 MG tablet; Take 1 tablet (50 mg total) by mouth daily.  Dispense: 30 tablet; Refill: 6 - Lipid panel; Future - Basic Metabolic Panel; Future - hydrochlorothiazide (HYDRODIURIL) 25 MG tablet; Take 1 tablet (25 mg total) by mouth daily.  Dispense: 30 tablet; Refill: 6  2. Sciatica of right side Stable - cyclobenzaprine (FLEXERIL) 10 MG tablet; Take 1 tablet (10 mg total) by mouth 2 (two) times daily as needed for muscle spasms.  Dispense: 60 tablet; Refill: 2 - ibuprofen (ADVIL) 600 MG tablet; Take 1 tablet (600 mg total) by mouth 2 (two) times daily as needed.  Dispense: 60 tablet; Refill: 3  3. Nausea and vomiting, intractability of vomiting not specified, unspecified vomiting type Questionable NSAID induced gastritis Advised to use PPI - H. pylori breath test  4. Other chest pain Chest pain is absent at this time EKG reveals new findings of septal ischemia Will refer to cardiology for stress test We will place on aspirin and nitroglycerin and advised to present to the ED if chest pain occurs - aspirin EC 81 MG tablet; Take 1 tablet (81 mg total) by mouth daily. Swallow whole.  Dispense: 30 tablet; Refill: 11 - nitroGLYCERIN (NITROSTAT) 0.4 MG SL tablet; Place 1 tablet  (0.4 mg total) under the tongue every 5 (five) minutes as needed for chest pain.  Dispense: 30 tablet; Refill: 1 - Ambulatory referral to Cardiology  5. Need for hepatitis C screening test - HCV RNA quant rflx ultra or genotyp([facility]); Future   Meds ordered this encounter  Medications  . losartan (COZAAR) 50 MG tablet    Sig: Take 1 tablet (50 mg total) by mouth daily.    Dispense:  30 tablet    Refill:  6    Discontinue amlodipine  . aspirin EC 81 MG tablet    Sig: Take 1 tablet (81 mg total) by mouth daily. Swallow whole.    Dispense:  30 tablet    Refill:  11  . nitroGLYCERIN (NITROSTAT) 0.4 MG SL tablet    Sig: Place 1 tablet (0.4 mg total) under the tongue every 5 (five) minutes as needed for chest pain.    Dispense:  30 tablet    Refill:  1  . hydrochlorothiazide (HYDRODIURIL) 25 MG tablet    Sig: Take 1 tablet (25 mg total) by mouth daily.    Dispense:  30 tablet    Refill:  6  . cyclobenzaprine (FLEXERIL) 10 MG tablet    Sig: Take 1 tablet (10 mg total) by mouth 2 (two) times daily as needed for muscle spasms.    Dispense:  60 tablet    Refill:  2  . ibuprofen (ADVIL) 600 MG tablet    Sig: Take 1 tablet (600 mg total) by mouth 2 (two) times daily as needed.    Dispense:  60 tablet    Refill:  3    Follow-up: Return in about 3 weeks (around 02/22/2020) for Chronic disease management.       Charlott Rakes, MD, FAAFP. [facility], [place]   02/02/2020, 8:25  AM

## 2020-02-01 NOTE — Telephone Encounter (Signed)
Patient called and says she's been having off and on chest pain for the past 2 months, not present at this time. She says it feels like a puncture pain under the right breast that's worse with movement. The pain doesn't radiate anywhere else. She says when the pain is there it's a 9. No other symptoms with the pain or at this time. Appointment scheduled for today at 48 with Dr. Margarita Rana, care advice given, patient verbalized understanding.  Reason for Disposition . [1] Chest pain lasts > 5 minutes AND [2] occurred > 3 days ago (72 hours) AND [3] NO chest pain or cardiac symptoms now  Answer Assessment - Initial Assessment Questions 1. LOCATION: "Where does it hurt?"       Under right breast feels like a puncture pain 2. RADIATION: "Does the pain go anywhere else?" (e.g., into neck, jaw, arms, back)     No 3. ONSET: "When did the chest pain begin?" (Minutes, hours or days)      2 months ago 4. PATTERN "Does the pain come and go, or has it been constant since it started?"  "Does it get worse with exertion?"      Comes and goes, not present now, worse with movement 5. DURATION: "How long does it last" (e.g., seconds, minutes, hours)     10-15 minutes 6. SEVERITY: "How bad is the pain?"  (e.g., Scale 1-10; mild, moderate, or severe)    - MILD (1-3): doesn't interfere with normal activities     - MODERATE (4-7): interferes with normal activities or awakens from sleep    - SEVERE (8-10): excruciating pain, unable to do any normal activities       9; no pain at present 7. CARDIAC RISK FACTORS: "Do you have any history of heart problems or risk factors for heart disease?" (e.g., angina, prior heart attack; diabetes, high blood pressure, high cholesterol, smoker, or strong family history of heart disease)     HTN 8. PULMONARY RISK FACTORS: "Do you have any history of lung disease?"  (e.g., blood clots in lung, asthma, emphysema, birth control pills)     No 9. CAUSE: "What do you think is causing the  chest pain?"     I don't know 10. OTHER SYMPTOMS: "Do you have any other symptoms?" (e.g., dizziness, nausea, vomiting, sweating, fever, difficulty breathing, cough)       No 11. PREGNANCY: "Is there any chance you are pregnant?" "When was your last menstrual period?"       No  Protocols used: CHEST PAIN-A-AH

## 2020-02-01 NOTE — Telephone Encounter (Signed)
Pt has appt today will evaluate concern at visit.

## 2020-02-01 NOTE — Patient Instructions (Signed)
Angina  Angina is very bad discomfort or pain in the chest, neck, arm, jaw, or back. The discomfort is caused by a lack of blood in the middle layer of the heart wall (myocardium). What are the causes? This condition is caused by a buildup of fat and cholesterol (plaque) in your arteries (atherosclerosis). This buildup narrows the arteries and makes it hard for blood to flow. What increases the risk? You are more likely to develop this condition if:  You have high levels of cholesterol in your blood.  You have high blood pressure (hypertension).  You have diabetes.  You have a family history of heart disease.  You are not active, or you do not exercise enough.  You feel sad (depressed).  You have been treated with high energy rays (radiation) on the left side of your chest. Other risk factors are:  Using tobacco.  Being very overweight (obese).  Eating a diet high in unhealthy fats (saturated fats).  Having stress, or being exposed to things that cause stress.  Using drugs, such as cocaine. Women have a greater risk for angina if:  They are older than 55.  They have stopped having their period (are in postmenopause). What are the signs or symptoms? Common symptoms of this condition in both men and women may include:  Chest pain, which may: ? Feel like a crushing or squeezing in the chest. ? Feel like a tightness, pressure, fullness, or heaviness in the chest. ? Last for more than a few minutes at a time. ? Stop and come back (recur) after a few minutes.  Pain in the neck, arm, jaw, or back.  Heartburn or upset stomach (indigestion) for no reason.  Being short of breath.  Feeling sick to your stomach (nauseous).  Sudden cold sweats. Women and people with diabetes may have other symptoms that are not usual, such as feeling:  Tired (fatigue).  Worried or nervous (anxious) for no reason.  Weak for no reason.  Dizzy or passing out (fainting). How is this  treated? This condition may be treated with:  Medicines. These are given to: ? Prevent blood clots. ? Prevent heart attack. ? Relax blood vessels and improve blood flow to the heart (nitrates). ? Reduce blood pressure. ? Improve the pumping action of the heart. ? Reduce fat and cholesterol in the blood.  A procedure to widen a narrowed or blocked artery in the heart (angioplasty).  Surgery to allow blood to go around a blocked artery (coronary artery bypass surgery). Follow these instructions at home: Medicines  Take over-the-counter and prescription medicines only as told by your doctor.  Do not take these medicines unless your doctor says that you can: ? NSAIDs. These include:  Ibuprofen.  Naproxen. ? Vitamin supplements that have vitamin A, vitamin E, or both. ? Hormone therapy that contains estrogen with or without progestin. Eating and drinking   Eat a heart-healthy diet that includes: ? Lots of fresh fruits and vegetables. ? Whole grains. ? Low-fat (lean) protein. ? Low-fat dairy products.  Follow instructions from your doctor about what you cannot eat or drink. Activity  Follow an exercise program that your doctor tells you.  Talk with your doctor about joining a program to help improve the health of your heart (cardiac rehab).  When you feel tired, take a break. Plan breaks if you know you are going to feel tired. Lifestyle   Do not use any products that contain nicotine or tobacco. This includes cigarettes, e-cigarettes, and   chewing tobacco. If you need help quitting, ask your doctor.  If your doctor says you can drink alcohol: ? Limit how much you use to:  0-1 drink a day for women who are not pregnant.  0-2 drinks a day for men. ? Be aware of how much alcohol is in your drink. In the [place] one drink equals:  One 12 oz bottle of beer (355 mL).  One 5 oz glass of wine (148 mL).  One 1 oz glass of hard liquor (44 mL). General instructions  Stay  at a healthy weight. If your doctor tells you to do so, work with him or her to lose weight.  Learn to deal with stress. If you need help, ask your doctor.  Keep your vaccines up to date. Get a flu shot every year.  Talk with your doctor if you feel sad. Take a screening test to see if you are at risk for depression.  Work with your doctor to manage any other health problems that you have. These may include diabetes or high blood pressure.  Keep all follow-up visits as told by your doctor. This is important. Get help right away if:  You have pain in your chest, neck, arm, jaw, or back, and the pain: ? Lasts more than a few minutes. ? Comes back. ? Does not get better after you take medicine under your tongue (sublingual nitroglycerin). ? Keeps getting worse. ? Comes more often.  You have any of these problems for no reason: ? Sweating a lot. ? Heartburn or upset stomach. ? Shortness of breath. ? Trouble breathing. ? Feeling sick to your stomach. ? Throwing up (vomiting). ? Feeling more tired than normal. ? Feeling nervous or worrying more than normal. ? Weakness.  You are suddenly dizzy or light-headed.  You pass out. These symptoms may be an emergency. Do not wait to see if the symptoms will go away. Get medical help right away. Call your local emergency services (911 in the [place] Do not drive yourself to the hospital. Summary  Angina is very bad discomfort or pain in the chest, neck, arm, neck, or back.  Symptoms include chest pain, heartburn or upset stomach for no reason, and shortness of breath.  Women or people with diabetes may have symptoms that are not usual, such as feeling nervous or worried for no reason, weak for no reason, or tired.  Take all medicines only as told by your doctor.  You should eat a heart-healthy diet and follow an exercise program. This information is not intended to replace advice given to you by your health care provider. Make sure you  discuss any questions you have with your health care provider. Document Revised: 09/22/2017 Document Reviewed: 09/22/2017 Elsevier Patient Education  2020 Elsevier Inc.  

## 2020-02-02 ENCOUNTER — Other Ambulatory Visit: Payer: Self-pay | Admitting: Family Medicine

## 2020-02-02 ENCOUNTER — Ambulatory Visit: Payer: 59 | Attending: Family Medicine

## 2020-02-02 DIAGNOSIS — I10 Essential (primary) hypertension: Secondary | ICD-10-CM

## 2020-02-02 DIAGNOSIS — Z1159 Encounter for screening for other viral diseases: Secondary | ICD-10-CM

## 2020-02-02 LAB — H. PYLORI BREATH TEST: H pylori Breath Test: NEGATIVE

## 2020-02-02 MED FILL — LOSARTAN POTASSIUM 50 MG TA: 50 | 30 days supply | Qty: 30 | Fill #0

## 2020-02-02 MED FILL — HYDROCHLOROTHIAZIDE 25 MG T: 25 | 30 days supply | Qty: 30 | Fill #0

## 2020-02-02 MED FILL — AMLODIPINE 2.5 MG TABLET: 2.5 | 30 days supply | Qty: 30 | Fill #0

## 2020-02-02 MED FILL — NITROGLYCERIN 0.4 MG TAB SL: 0.4 | 20 days supply | Qty: 25 | Fill #0

## 2020-02-02 MED FILL — IBUPROFEN 600 MG TABLET: 600 | 30 days supply | Qty: 60 | Fill #0

## 2020-02-02 MED FILL — CYCLOBENZAPRINE 10 MG TAB: 10 | 30 days supply | Qty: 60 | Fill #0

## 2020-02-03 ENCOUNTER — Other Ambulatory Visit: Payer: Self-pay | Admitting: Family Medicine

## 2020-02-03 LAB — BASIC METABOLIC PANEL
BUN/Creatinine Ratio: 16 (ref 9–23)
BUN: 15 mg/dL (ref 6–24)
CO2: 23 mmol/L (ref 20–29)
Calcium: 10 mg/dL (ref 8.7–10.2)
Chloride: 101 mmol/L (ref 96–106)
Creatinine, Ser: 0.95 mg/dL (ref 0.57–1.00)
GFR calc Af Amer: 81 mL/min/{1.73_m2} (ref >59)
GFR calc non Af Amer: 71 mL/min/{1.73_m2} (ref >59)
Glucose: 91 mg/dL (ref 65–99)
Potassium: 4.2 mmol/L (ref 3.5–5.2)
Sodium: 141 mmol/L (ref 134–144)

## 2020-02-03 LAB — HCV RNA QUANT RFLX ULTRA OR GENOTYP: HCV Quant Baseline: NOT DETECTED IU/mL

## 2020-02-03 LAB — LIPID PANEL
Chol/HDL Ratio: 5.7 ratio — ABNORMAL HIGH (ref 0.0–4.4)
Cholesterol, Total: 301 mg/dL — ABNORMAL HIGH (ref 100–199)
HDL: 53 mg/dL (ref >39)
LDL Chol Calc (NIH): 222 mg/dL — ABNORMAL HIGH (ref 0–99)
Triglycerides: 140 mg/dL (ref 0–149)
VLDL Cholesterol Cal: 26 mg/dL (ref 5–40)

## 2020-02-03 MED ORDER — ATORVASTATIN CALCIUM 20 MG PO TABS: 20.0000 mg | ORAL_TABLET | Freq: Every day | ORAL | 6 refills | Status: DC

## 2020-02-04 MED FILL — ATORVASTATIN CALCIUM 20 MG: 20 | 30 days supply | Qty: 30 | Fill #0

## 2020-02-22 ENCOUNTER — Encounter: Payer: Self-pay | Admitting: Internal Medicine

## 2020-02-22 ENCOUNTER — Other Ambulatory Visit: Payer: Self-pay

## 2020-02-22 ENCOUNTER — Ambulatory Visit (INDEPENDENT_AMBULATORY_CARE_PROVIDER_SITE_OTHER): Payer: 59 | Admitting: Internal Medicine

## 2020-02-22 ENCOUNTER — Other Ambulatory Visit: Payer: Self-pay | Admitting: Internal Medicine

## 2020-02-22 VITALS — BP 110/80 | HR 93 | Ht 66.0 in | Wt 168.8 lb

## 2020-02-22 DIAGNOSIS — I209 Angina pectoris, unspecified: Secondary | ICD-10-CM | POA: Insufficient documentation

## 2020-02-22 DIAGNOSIS — R079 Chest pain, unspecified: Secondary | ICD-10-CM

## 2020-02-22 DIAGNOSIS — R0789 Other chest pain: Secondary | ICD-10-CM

## 2020-02-22 DIAGNOSIS — I10 Essential (primary) hypertension: Secondary | ICD-10-CM

## 2020-02-22 DIAGNOSIS — R0600 Dyspnea, unspecified: Secondary | ICD-10-CM | POA: Diagnosis not present

## 2020-02-22 DIAGNOSIS — E7801 Familial hypercholesterolemia: Secondary | ICD-10-CM

## 2020-02-22 DIAGNOSIS — R0609 Other forms of dyspnea: Secondary | ICD-10-CM | POA: Insufficient documentation

## 2020-02-22 MED ORDER — METOPROLOL TARTRATE 100 MG PO TABS: ORAL_TABLET | ORAL | 0 refills | Status: DC

## 2020-02-22 MED ORDER — NITROGLYCERIN 0.4 MG SL SUBL: 0.4000 mg | SUBLINGUAL_TABLET | SUBLINGUAL | 3 refills | Status: DC | PRN

## 2020-02-22 MED FILL — METOPROLOL TARTRATE 100 MG: 100 | 1 days supply | Qty: 1 | Fill #0

## 2020-02-22 MED FILL — NITROGLYCERIN 0.4 MG TAB SL: 0.4 | 20 days supply | Qty: 25 | Fill #0

## 2020-02-22 NOTE — Progress Notes (Signed)
Cardiology Office Note:    Date:  02/22/2020   ID:  Brandy [name], DOB September 12, 1970, MRN [id number]  PCP:  Hoy RegisterNewlin, Enobong, MD  [facility] Cardiologist:  No primary care provider on file.  [facility] Electrophysiologist:  None   CC: Chest Pain Consulted for the evaluation of chest pain syndrome at the behest of Hoy RegisterNewlin, Enobong, MD  History of Present Illness:    Brandy Clarkeressa Latres Prue is a 50 y.o. female with a hx of HTN, Familial Hypercholesterolemia, Carpal Tunnel Syndrome, who presents for evaluation.  Patient notes that she is feeling chest pain for one month. When active or doing anything increased, but has resting chest pain. Notes sternal chest pain and pressure; also baseline chest tightness.  Discomfort improves with rest; but is always present.  Patient exertion notable for walking and doing step and feels the chest pain and DOE.  Notes resting shortness of breath.  No PND or orthopnea.  Notes bendopnea, no weight gain, notes leg swelling and abdominal swelling.  No syncope or near syncope.  No change in leg swelling off of amlodipine Notes palpitations and funny heart beat when moving.  Occurs when feeling active (walking in here).  Patient reports prior cardiac testing including 2018 echo.  No history of pre-eclampsia.    Ambulatory BP 127/100.   Past Medical History:  Diagnosis Date  . Anxiety   . Arthritis   . Depression   . Disc degeneration, lumbar 2011  . Hypertension   . IBS (irritable bowel syndrome)     Past Surgical History:  Procedure Laterality Date  . TUBAL LIGATION      Current Medications: Current Meds  Medication Sig  . amLODipine (NORVASC) 2.5 MG tablet TAKE 1 TABLET (2.5 MG TOTAL) BY MOUTH DAILY.  Marland Kitchen. aspirin EC 81 MG tablet Take 1 tablet (81 mg total) by mouth daily. Swallow whole.  Marland Kitchen. atorvastatin (LIPITOR) 20 MG tablet Take 1 tablet (20 mg total) by mouth daily.  . cyclobenzaprine (FLEXERIL) 10 MG tablet Take 1 tablet (10 mg  total) by mouth 2 (two) times daily as needed for muscle spasms.  Marland Kitchen. dicyclomine (BENTYL) 10 MG capsule Take 1 capsule (10 mg total) by mouth 3 (three) times daily before meals.  . hydrochlorothiazide (HYDRODIURIL) 25 MG tablet Take 1 tablet (25 mg total) by mouth daily.  Marland Kitchen. ibuprofen (ADVIL) 600 MG tablet Take 1 tablet (600 mg total) by mouth 2 (two) times daily as needed.  Marland Kitchen. losartan (COZAAR) 50 MG tablet Take 1 tablet (50 mg total) by mouth daily.  . nitroGLYCERIN (NITROSTAT) 0.4 MG SL tablet Place 1 tablet (0.4 mg total) under the tongue every 5 (five) minutes as needed for chest pain.  Marland Kitchen. omeprazole (PRILOSEC) 20 MG capsule Take 1 capsule (20 mg total) by mouth daily.  . ondansetron (ZOFRAN ODT) 4 MG disintegrating tablet Take 1 tablet (4 mg total) by mouth every 8 (eight) hours as needed for nausea or vomiting.  . triamcinolone (NASACORT) 55 MCG/ACT AERO nasal inhaler Place 2 sprays into the nose daily.     Allergies:   Patient has no known allergies.   Social History   Socioeconomic History  . Marital status: Divorced    Spouse name: Not on file  . Number of children: 3  . Years of education: Not on file  . Highest education level: 12th grade  Occupational History  . Occupation: Company secretarypayroll clerk  Tobacco Use  . Smoking status: Never Smoker  . Smokeless tobacco: Never Used  Vaping Use  . Vaping Use: Never used  Substance and Sexual Activity  . Alcohol use: Yes    Comment: social  . Drug use: No  . Sexual activity: Yes    Partners: Male    Birth control/protection: Surgical    Comment: BTL  Other Topics Concern  . Not on file  Social History Narrative  . Not on file   Social Determinants of Health   Financial Resource Strain: Not on file  Food Insecurity: Not on file  Transportation Needs: Not on file  Physical Activity: Not on file  Stress: Not on file  Social Connections: Not on file    Family History: The patient's family history includes Breast cancer (age of  onset: 73) in her maternal aunt; Breast cancer (age of onset: 49) in her mother; Diabetes in her father; Hyperlipidemia in her father and mother; Hypertension in her father and paternal grandfather; Prostate cancer in her paternal grandfather; Thyroid disease in her father and mother. History of coronary artery disease notable for cousin. History of heart failure notable for no members. History of arrhythmia notable for no members. No history of bicuspid aortic valve or aortic aneurysm or dissection.  ROS:   Please see the history of present illness.     All other systems reviewed and are negative.  EKGs/Labs/Other Studies Reviewed:    The following studies were reviewed today:  EKG:   02/01/20: SR rate 87, LAE, borderline evidence for ant. infarct  Transthoracic Echocardiogram: Date:07/09/2016 Results: Left ventricle: The cavity size was normal. Systolic function was  normal. The estimated ejection fraction was in the range of 60%  to 65%. Wall motion was normal; there were no regional wall  motion abnormalities. Left ventricular diastolic function  parameters were normal.  - Aortic valve: Transvalvular velocity was within the normal range.  There was no stenosis. There was no regurgitation.  - Mitral valve: Transvalvular velocity was within the normal range.  There was no evidence for stenosis. There was no regurgitation.  - Right ventricle: The cavity size was normal. Wall thickness was  normal. Systolic function was normal.  - Atrial septum: No defect or patent foramen ovale was identified.  - Tricuspid valve: There was no regurgitation.   NonCardiac CT: Ab/Pelvis Date:07/02/19 Results: No arterial atherosclerosis  Recent Labs: 06/09/2019: Hemoglobin 15.1; Platelets 309.0 02/02/2020: BUN 15; Creatinine, Ser 0.95; Potassium 4.2; Sodium 141  Recent Lipid Panel    Component Value Date/Time   CHOL 301 (H) 02/02/2020 0940   TRIG 140 02/02/2020 0940   HDL 53  02/02/2020 0940   CHOLHDL 5.7 (H) 02/02/2020 0940   CHOLHDL 4.2 09/10/2017 0959   VLDL 26 09/10/2017 0959   LDLCALC 222 (H) 02/02/2020 0940   Risk Assessment/Calculations:     The 10-year ASCVD risk score Denman George DC Montez Hageman., et al., 2013) is: 2.6%   Values used to calculate the score:     Age: 50 years     Sex: Female     Is Non-Hispanic African American: Yes     Diabetic: No     Tobacco smoker: No     Systolic Blood Pressure: 110 mmHg     Is BP treated: Yes     HDL Cholesterol: 53 mg/dL     Total Cholesterol: 301 mg/dL   Physical Exam:    VS:  BP 110/80   Pulse 93   Ht 5\' 6"  (1.676 m)   Wt 168 lb 12.8 oz (76.6 kg)   SpO2 96%  BMI 27.25 kg/m     Wt Readings from Last 3 Encounters:  02/22/20 168 lb 12.8 oz (76.6 kg)  02/01/20 169 lb (76.7 kg)  06/09/19 169 lb 4 oz (76.8 kg)    GEN:  Well nourished, well developed in no acute distress HEENT: Normal NECK: No JVD; No carotid bruits LYMPHATICS: No lymphadenopathy CARDIAC: RRR, no murmurs, rubs, gallops RESPIRATORY:  Clear to auscultation without rales, wheezing or rhonchi  ABDOMEN: Soft, non-tender, non-distended MUSCULOSKELETAL:  No edema; No deformity, thick Achilles bilaterally without tenderness SKIN: Warm and dry NEUROLOGIC:  Alert and oriented x 3 PSYCHIATRIC:  Normal affect   ASSESSMENT:    1. Angina pectoris (HCC)   2. Other chest pain   3. DOE (dyspnea on exertion)   4. Familial hypercholesterolemia   5. Essential hypertension    PLAN:    In order of problems listed above:  Angina Pectoris DOE - The patient presents with cardiac chest pain - EKG shows possible anterior infarct pattern (borderline) - Sublingual nitroglycerin as need for chest pain - Would recommend an echocardiogram to assess LVEF and exclude WMA; will get BNP,  - Would recommend CCTA +/- FFR to exclude obstructive CAD  - continue ASA  Hyperlipidemia (familial) -LDL goal less than 100 - Fasting triglycerides notable for -continue  current statin (atorvastatin 20 mg Po Daily) - low threshold to send to lipid clinic for additional patient support  - offered Lp(a) for family screening  -Recheck lipid profile LFTs - gave education on dietary changes  Essential Hypertension - ambulatory blood pressure at elevated, will continue ambulatory BP monitoring; gave education on how to perform ambulatory blood pressure monitoring including the frequency and technique; goal ambulatory blood pressure < 135/85 on average - continue home medications  - discussed diet (DASH/low sodium), and exercise/weight loss interventions   Hyperlipidemia (familial/mixed) -ASCVD 5.9% - gave education on dietary changes  2 months follow up unless new symptoms or abnormal test results warranting change in plan  Would be reasonable for APP Follow up   Medication Adjustments/Labs and Tests Ordered: Current medicines are reviewed at length with the patient today.  Concerns regarding medicines are outlined above.  No orders of the defined types were placed in this encounter.  No orders of the defined types were placed in this encounter.   Patient Instructions  Medication Instructions:  *If you need a refill on your cardiac medications before your next appointment, please call your pharmacy*  Lab Work: If you have labs (blood work) drawn today and your tests are completely normal, you will receive your results only by: Marland Kitchen MyChart Message (if you have MyChart) OR . A paper copy in the mail If you have any lab test that is abnormal or we need to change your treatment, we will call you to review the results.  Testing/Procedures: None ordered today.  Follow-Up: At [facility], you and your health needs are our priority.  As part of our continuing mission to provide you with exceptional heart care, we have created designated Provider Care Teams.  These Care Teams include your primary Cardiologist (physician) and Advanced Practice Providers  (APPs -  Physician Assistants and Nurse Practitioners) who all work together to provide you with the care you need, when you need it.  We recommend signing up for the patient portal called "MyChart".  Sign up information is provided on this After Visit Summary.  MyChart is used to connect with patients for Virtual Visits (Telemedicine).  Patients are able to  view lab/test results, encounter notes, upcoming appointments, etc.  Non-urgent messages can be sent to your provider as well.   To learn more about what you can do with MyChart, go to [email or web address].    Your next appointment:   6 month(s)  The format for your next appointment:   In Person  Provider:   You may see Dr. Johnsie Cancel or one of the following Advanced Practice Providers on your designated Care Team:    Melina Copa, PA-C  Ermalinda Barrios, PA-C        Signed, Werner Lean, MD  02/22/2020 3:33 PM    [place]

## 2020-02-22 NOTE — Patient Instructions (Addendum)
Medication Instructions:  *If you need a refill on your cardiac medications before your next appointment, please call your pharmacy*  Lab Work: Your physician recommends that you have lab work today- BMET, BNP and Lipoprotein A  If you have labs (blood work) drawn today and your tests are completely normal, you will receive your results only by: Marland Kitchen MyChart Message (if you have MyChart) OR . A paper copy in the mail If you have any lab test that is abnormal or we need to change your treatment, we will call you to review the results.  Testing/Procedures: Your physician has requested that you have an echocardiogram. Echocardiography is a painless test that uses sound waves to create images of your heart. It provides your doctor with information about the size and shape of your heart and how well your heart's chambers and valves are working. This procedure takes approximately one hour. There are no restrictions for this procedure.  Your physician has requested that you have cardiac CT. Cardiac computed tomography (CT) is a painless test that uses an x-ray machine to take clear, detailed pictures of your heart. For further information please visit [email or web address]. Please follow instruction sheet as given.  Follow-Up: At [facility], you and your health needs are our priority.  As part of our continuing mission to provide you with exceptional heart care, we have created designated Provider Care Teams.  These Care Teams include your primary Cardiologist (physician) and Advanced Practice Providers (APPs -  Physician Assistants and Nurse Practitioners) who all work together to provide you with the care you need, when you need it.  We recommend signing up for the patient portal called "MyChart".  Sign up information is provided on this After Visit Summary.  MyChart is used to connect with patients for Virtual Visits (Telemedicine).  Patients are able to view lab/test results, encounter notes, upcoming  appointments, etc.  Non-urgent messages can be sent to your provider as well.   To learn more about what you can do with MyChart, go to [email or web address].    Your next appointment:   2 month(s)  The format for your next appointment:   In Person  Provider:   You may see Dr. Gasper Sells or one of the following Advanced Practice Providers on your designated Care Team:    Melina Copa, PA-C  Ermalinda Barrios, PA-C  Your cardiac CT will be scheduled at one of the below locations:   [facility] [address] [place], [place] [address] [phone]   If scheduled at [facility], please arrive at the [facility] main entrance of [facility] 30 minutes prior to test start time. Proceed to the [facility] Radiology Department (first floor) to check-in and test prep.  Please follow these instructions carefully (unless otherwise directed):  On the Night Before the Test: . Be sure to Drink plenty of water. . Do not consume any caffeinated/decaffeinated beverages or chocolate 12 hours prior to your test. . Do not take any antihistamines 12 hours prior to your test. .  On the Day of the Test: . Drink plenty of water. Do not drink any water within one hour of the test. . Do not eat any food 4 hours prior to the test. . You may take your regular medications prior to the test.  . Take metoprolol (Lopressor) 100 mg two hours prior to test. . FEMALES- please wear underwire-free bra if available      After the Test: . Drink plenty of  water. . After receiving IV contrast, you may experience a mild flushed feeling. This is normal. . On occasion, you may experience a mild rash up to 24 hours after the test. This is not dangerous. If this occurs, you can take Benadryl 25 mg and increase your fluid intake. . If you experience trouble breathing, this can be serious. If it is severe call 911 IMMEDIATELY. If it is mild, please call our office.   Once we have  confirmed authorization from your insurance company, we will call you to set up a date and time for your test. Based on how quickly your insurance processes prior authorizations requests, please allow up to 4 weeks to be contacted for scheduling your Cardiac CT appointment. Be advised that routine Cardiac CT appointments could be scheduled as many as 8 weeks after your provider has ordered it.  For non-scheduling related questions, please contact the cardiac imaging nurse navigator should you have any questions/concerns: Marchia Bond, Cardiac Imaging Nurse Navigator Burley Saver, Interim Cardiac Imaging Nurse [place] [facility] Direct Office Dial: [phone]   For scheduling needs, including cancellations and rescheduling, please call [place], [phone].

## 2020-02-23 ENCOUNTER — Ambulatory Visit: Payer: 59 | Attending: Family Medicine | Admitting: Family Medicine

## 2020-02-23 DIAGNOSIS — I209 Angina pectoris, unspecified: Secondary | ICD-10-CM

## 2020-02-23 DIAGNOSIS — K580 Irritable bowel syndrome with diarrhea: Secondary | ICD-10-CM

## 2020-02-23 LAB — BASIC METABOLIC PANEL
BUN/Creatinine Ratio: 13 (ref 9–23)
BUN: 11 mg/dL (ref 6–24)
CO2: 29 mmol/L (ref 20–29)
Calcium: 9.8 mg/dL (ref 8.7–10.2)
Chloride: 102 mmol/L (ref 96–106)
Creatinine, Ser: 0.86 mg/dL (ref 0.57–1.00)
GFR calc Af Amer: 92 mL/min/{1.73_m2} (ref >59)
GFR calc non Af Amer: 80 mL/min/{1.73_m2} (ref >59)
Glucose: 90 mg/dL (ref 65–99)
Potassium: 4.1 mmol/L (ref 3.5–5.2)
Sodium: 143 mmol/L (ref 134–144)

## 2020-02-23 LAB — PRO B NATRIURETIC PEPTIDE: NT-Pro BNP: 8 pg/mL (ref 0–249)

## 2020-02-23 LAB — LIPOPROTEIN A (LPA): Lipoprotein (a): 43 nmol/L (ref <75.0)

## 2020-02-23 NOTE — Progress Notes (Signed)
Virtual Visit via Telephone Note  I connected with Brandy [name], on 02/23/2020 at 2:10 PM by telephone due to the COVID-19 pandemic and verified that I am speaking with the correct person using two identifiers.   Consent: I discussed the limitations, risks, security and privacy concerns of performing an evaluation and management service by telephone and the availability of in person appointments. I also discussed with the patient that there may be a patient responsible charge related to this service. The patient expressed understanding and agreed to proceed.   Location of Patient: Environmental education officer of Provider: Home   Persons participating in Telemedicine visit: Sella Buys [place]-CMA Dr. Margarita Rana     History of Present Illness: Brandy [name] a 50 year old female with a history of hypertension, hyperlipidemia anxiety and depressionhere for a follow up visit.  Her nausea and vomiting have improved since her last visit and she uses her PPI prn. IBS symptoms are stable. H.pylori test was negative.  She has had an episode of chest pain since her last visit with me but forgot to use her Nitroglycerine Saw Cardiology yesterday with plans for Cardiac work up beginning with an echo. Labs had revealed Hyperlipidemia for which Lipitor was started and she has been compliant. She has no additional concerns today.  Past Medical History:  Diagnosis Date  . Anxiety   . Arthritis   . Depression   . Disc degeneration, lumbar 2011  . Hypertension   . IBS (irritable bowel syndrome)    No Known Allergies  Current Outpatient Medications on File Prior to Visit  Medication Sig Dispense Refill  . amLODipine (NORVASC) 2.5 MG tablet TAKE 1 TABLET (2.5 MG TOTAL) BY MOUTH DAILY. 30 tablet 0  . aspirin EC 81 MG tablet Take 1 tablet (81 mg total) by mouth daily. Swallow whole. 30 tablet 11  . atorvastatin (LIPITOR) 20 MG tablet Take 1 tablet (20 mg total) by mouth  daily. 30 tablet 6  . cyclobenzaprine (FLEXERIL) 10 MG tablet Take 1 tablet (10 mg total) by mouth 2 (two) times daily as needed for muscle spasms. 60 tablet 2  . dicyclomine (BENTYL) 10 MG capsule Take 1 capsule (10 mg total) by mouth 3 (three) times daily before meals. 90 capsule 3  . hydrochlorothiazide (HYDRODIURIL) 25 MG tablet Take 1 tablet (25 mg total) by mouth daily. 30 tablet 6  . ibuprofen (ADVIL) 600 MG tablet Take 1 tablet (600 mg total) by mouth 2 (two) times daily as needed. 60 tablet 3  . losartan (COZAAR) 50 MG tablet Take 1 tablet (50 mg total) by mouth daily. 30 tablet 6  . nitroGLYCERIN (NITROSTAT) 0.4 MG SL tablet Place 1 tablet (0.4 mg total) under the tongue every 5 (five) minutes as needed for chest pain. 25 tablet 3  . ondansetron (ZOFRAN ODT) 4 MG disintegrating tablet Take 1 tablet (4 mg total) by mouth every 8 (eight) hours as needed for nausea or vomiting. 20 tablet 0  . triamcinolone (NASACORT) 55 MCG/ACT AERO nasal inhaler Place 2 sprays into the nose daily. 1 Inhaler 0  . metoprolol tartrate (LOPRESSOR) 100 MG tablet Take one tablet by mouth 2 hours prior to cardiac CT. (Patient not taking: Reported on 02/23/2020) 1 tablet 0  . omeprazole (PRILOSEC) 20 MG capsule Take 1 capsule (20 mg total) by mouth daily. (Patient not taking: Reported on 02/23/2020) 30 capsule 3   No current facility-administered medications on file prior to visit.    Observations/Objective: Awake, alert, oriented x3  Not in acute distress  Assessment and Plan: 1. Angina pectoris (HCC) Absent at this time Currently undergoing Cardiac work up Risk factor modification Disussed chest pain precautions  2. Irritable bowel syndrome with diarrhea Improved   Follow Up Instructions: 6 months   I discussed the assessment and treatment plan with the patient. The patient was provided an opportunity to ask questions and all were answered. The patient agreed with the plan and demonstrated an  understanding of the instructions.   The patient was advised to call back or seek an in-person evaluation if the symptoms worsen or if the condition fails to improve as anticipated.     I provided 11 minutes total of non-face-to-face time during this encounter including median intraservice time, reviewing previous notes, investigations, ordering medications, medical decision making, coordinating care and patient verbalized understanding at the end of the visit.     Hoy Register, MD, FAAFP. [facility] [place], [place] [phone]   02/23/2020, 2:10 PM

## 2020-02-23 NOTE — Progress Notes (Signed)
Nausea and vomiting has stopped.

## 2020-03-09 MED FILL — ATORVASTATIN CALCIUM 20 MG: 20 | 30 days supply | Qty: 30 | Fill #1

## 2020-03-09 MED FILL — CYCLOBENZAPRINE 10 MG TAB: 10 | 30 days supply | Qty: 60 | Fill #1

## 2020-03-09 MED FILL — LOSARTAN POTASSIUM 50 MG TA: 50 | 30 days supply | Qty: 30 | Fill #1

## 2020-03-09 MED FILL — HYDROCHLOROTHIAZIDE 25 MG T: 25 | 30 days supply | Qty: 30 | Fill #1

## 2020-03-13 ENCOUNTER — Telehealth (HOSPITAL_COMMUNITY): Payer: Self-pay | Admitting: Emergency Medicine

## 2020-03-13 ENCOUNTER — Telehealth (HOSPITAL_COMMUNITY): Payer: Self-pay | Admitting: *Deleted

## 2020-03-13 ENCOUNTER — Other Ambulatory Visit: Payer: Self-pay

## 2020-03-13 ENCOUNTER — Other Ambulatory Visit: Payer: Self-pay | Admitting: Internal Medicine

## 2020-03-13 MED ORDER — METOPROLOL TARTRATE 100 MG PO TABS: ORAL_TABLET | ORAL | 0 refills | Status: DC

## 2020-03-13 MED FILL — METOPROLOL TARTRATE 100 MG: 100 | 1 days supply | Qty: 1 | Fill #0

## 2020-03-13 NOTE — Telephone Encounter (Signed)
Reaching out to patient to offer assistance regarding upcoming cardiac imaging study; pt verbalizes understanding of appt date/time, parking situation and where to check in, pre-test NPO status and medications ordered, and verified current allergies; name and call back number provided for further questions should they arise  Ilani Otterson RN Navigator Cardiac Imaging [facility] [phone] office [phone] cell  

## 2020-03-13 NOTE — Telephone Encounter (Signed)
Pt states she needs metoprolol resubmitted to pharm for pickup. I confirmed her pharm on file as correct.  Marchia Bond RN Navigator Cardiac Imaging [facility] [phone] Office  [phone] Cell

## 2020-03-14 ENCOUNTER — Ambulatory Visit (HOSPITAL_BASED_OUTPATIENT_CLINIC_OR_DEPARTMENT_OTHER): Payer: 59

## 2020-03-14 ENCOUNTER — Other Ambulatory Visit: Payer: Self-pay

## 2020-03-14 DIAGNOSIS — E7801 Familial hypercholesterolemia: Secondary | ICD-10-CM | POA: Diagnosis present

## 2020-03-14 DIAGNOSIS — R0789 Other chest pain: Secondary | ICD-10-CM | POA: Diagnosis not present

## 2020-03-14 DIAGNOSIS — R0609 Other forms of dyspnea: Secondary | ICD-10-CM

## 2020-03-14 DIAGNOSIS — R0600 Dyspnea, unspecified: Secondary | ICD-10-CM | POA: Diagnosis present

## 2020-03-14 DIAGNOSIS — I209 Angina pectoris, unspecified: Secondary | ICD-10-CM

## 2020-03-14 DIAGNOSIS — I10 Essential (primary) hypertension: Secondary | ICD-10-CM | POA: Diagnosis present

## 2020-03-14 LAB — ECHOCARDIOGRAM COMPLETE
Area-P 1/2: 2.6 cm2
S' Lateral: 2 cm

## 2020-03-15 ENCOUNTER — Encounter: Payer: 59 | Admitting: *Deleted

## 2020-03-15 ENCOUNTER — Ambulatory Visit (HOSPITAL_COMMUNITY)
Admission: RE | Admit: 2020-03-15 | Discharge: 2020-03-15 | Disposition: A | Discharge status: Home / Self Care | Payer: 59 | Source: Ambulatory Visit | Attending: Internal Medicine | Admitting: Internal Medicine

## 2020-03-15 DIAGNOSIS — R0609 Other forms of dyspnea: Secondary | ICD-10-CM

## 2020-03-15 DIAGNOSIS — R0600 Dyspnea, unspecified: Secondary | ICD-10-CM | POA: Insufficient documentation

## 2020-03-15 DIAGNOSIS — E7801 Familial hypercholesterolemia: Secondary | ICD-10-CM | POA: Insufficient documentation

## 2020-03-15 DIAGNOSIS — I10 Essential (primary) hypertension: Secondary | ICD-10-CM | POA: Insufficient documentation

## 2020-03-15 DIAGNOSIS — R0789 Other chest pain: Secondary | ICD-10-CM | POA: Diagnosis not present

## 2020-03-15 DIAGNOSIS — I209 Angina pectoris, unspecified: Secondary | ICD-10-CM | POA: Insufficient documentation

## 2020-03-15 DIAGNOSIS — Z006 Encounter for examination for normal comparison and control in clinical research program: Secondary | ICD-10-CM

## 2020-03-15 MED ORDER — NITROGLYCERIN 0.4 MG SL SUBL
SUBLINGUAL_TABLET | SUBLINGUAL | Status: AC
  Administered 2020-03-15: 0.8 mg via SUBLINGUAL
  Filled 2020-03-15: qty 2

## 2020-03-15 MED ORDER — NITROGLYCERIN 0.4 MG SL SUBL: 0.8000 mg | SUBLINGUAL_TABLET | Freq: Once | SUBLINGUAL | Status: AC | PRN

## 2020-03-15 MED ORDER — IOHEXOL 350 MG/ML SOLN
80.0000 mL | Freq: Once | INTRAVENOUS | Status: AC | PRN
  Administered 2020-03-15: 80 mL via INTRAVENOUS

## 2020-03-15 NOTE — Research (Signed)
CADFEM Informed Consent   Subject Name: Brandy [name]  Subject met inclusion and exclusion criteria.  The informed consent form, study requirements and expectations were reviewed with the subject and questions and concerns were addressed prior to the signing of the consent form.  The subject verbalized understanding of the trial requirements.  The subject agreed to participate in the CADFEM  trial and signed the informed consent at 0706 on 03/15/2020.  The informed consent was obtained prior to performance of any protocol-specific procedures for the subject.  A copy of the signed informed consent was given to the subject and a copy was placed in the subject's medical record.   Philemon Kingdom D

## 2020-03-21 ENCOUNTER — Ambulatory Visit (INDEPENDENT_AMBULATORY_CARE_PROVIDER_SITE_OTHER): Payer: 59 | Admitting: Nurse Practitioner

## 2020-03-21 ENCOUNTER — Encounter: Payer: Self-pay | Admitting: Nurse Practitioner

## 2020-03-21 ENCOUNTER — Other Ambulatory Visit (HOSPITAL_COMMUNITY)
Admission: RE | Admit: 2020-03-21 | Discharge: 2020-03-21 | Disposition: A | Discharge status: Home / Self Care | Payer: 59 | Source: Ambulatory Visit | Attending: Nurse Practitioner | Admitting: Nurse Practitioner

## 2020-03-21 ENCOUNTER — Other Ambulatory Visit: Payer: Self-pay

## 2020-03-21 VITALS — BP 117/85 | HR 97 | Ht 67.0 in | Wt 174.2 lb

## 2020-03-21 DIAGNOSIS — N760 Acute vaginitis: Secondary | ICD-10-CM

## 2020-03-21 DIAGNOSIS — N941 Unspecified dyspareunia: Secondary | ICD-10-CM | POA: Diagnosis not present

## 2020-03-21 DIAGNOSIS — R6882 Decreased libido: Secondary | ICD-10-CM | POA: Diagnosis not present

## 2020-03-21 DIAGNOSIS — Z01419 Encounter for gynecological examination (general) (routine) without abnormal findings: Secondary | ICD-10-CM | POA: Diagnosis present

## 2020-03-21 DIAGNOSIS — N898 Other specified noninflammatory disorders of vagina: Secondary | ICD-10-CM | POA: Insufficient documentation

## 2020-03-21 DIAGNOSIS — I10 Essential (primary) hypertension: Secondary | ICD-10-CM

## 2020-03-21 DIAGNOSIS — F419 Anxiety disorder, unspecified: Secondary | ICD-10-CM

## 2020-03-21 DIAGNOSIS — G4701 Insomnia due to medical condition: Secondary | ICD-10-CM | POA: Diagnosis not present

## 2020-03-21 DIAGNOSIS — F32A Depression, unspecified: Secondary | ICD-10-CM

## 2020-03-21 DIAGNOSIS — B9689 Other specified bacterial agents as the cause of diseases classified elsewhere: Secondary | ICD-10-CM

## 2020-03-21 NOTE — Progress Notes (Signed)
GYNECOLOGY ANNUAL PREVENTATIVE CARE ENCOUNTER NOTE  Subjective:   Makayli Bracken is a 50 y.o. G37P2003 female here for a routine annual gynecologic exam.  Current complaints: Low Libido, pain with intercourse, difficulty sleeping and hot flashes.   Denies abnormal vaginal bleeding, discharge, pelvic pain, or other gynecologic concerns.    Gynecologic History No LMP recorded (lmp unknown). (Menstrual status: Other).postmenopausal for 7 years Contraception: post menopausal Last Pap: 04-22-2018. Results were: normal Last mammogram: . Results were: normal  Obstetric History OB History  Gravida Para Term Preterm AB Living  2 2 2     3   SAB IAB Ectopic Multiple Live Births        1 3    # Outcome Date GA Lbr Len/2nd Weight Sex Delivery Anes PTL Lv  2A Term 2003    M Vag-Spont   LIV  2B Term 2003    M Vag-Spont   LIV  1 Term 2001    F Vag-Spont   LIV    Past Medical History:  Diagnosis Date  . Anxiety   . Arthritis   . Depression   . Disc degeneration, lumbar 2011  . Hypertension   . IBS (irritable bowel syndrome)     Past Surgical History:  Procedure Laterality Date  . TUBAL LIGATION      Current Outpatient Medications on File Prior to Visit  Medication Sig Dispense Refill  . amLODipine (NORVASC) 2.5 MG tablet TAKE 1 TABLET (2.5 MG TOTAL) BY MOUTH DAILY. 30 tablet 0  . aspirin EC 81 MG tablet Take 1 tablet (81 mg total) by mouth daily. Swallow whole. 30 tablet 11  . atorvastatin (LIPITOR) 20 MG tablet Take 1 tablet (20 mg total) by mouth daily. 30 tablet 6  . cyclobenzaprine (FLEXERIL) 10 MG tablet Take 1 tablet (10 mg total) by mouth 2 (two) times daily as needed for muscle spasms. 60 tablet 2  . dicyclomine (BENTYL) 10 MG capsule Take 1 capsule (10 mg total) by mouth 3 (three) times daily before meals. 90 capsule 3  . hydrochlorothiazide (HYDRODIURIL) 25 MG tablet Take 1 tablet (25 mg total) by mouth daily. 30 tablet 6  . ibuprofen (ADVIL) 600 MG tablet Take 1  tablet (600 mg total) by mouth 2 (two) times daily as needed. 60 tablet 3  . losartan (COZAAR) 50 MG tablet Take 1 tablet (50 mg total) by mouth daily. 30 tablet 6  . nitroGLYCERIN (NITROSTAT) 0.4 MG SL tablet Place 1 tablet (0.4 mg total) under the tongue every 5 (five) minutes as needed for chest pain. 25 tablet 3  . omeprazole (PRILOSEC) 20 MG capsule Take 1 capsule (20 mg total) by mouth daily. 30 capsule 3  . ondansetron (ZOFRAN ODT) 4 MG disintegrating tablet Take 1 tablet (4 mg total) by mouth every 8 (eight) hours as needed for nausea or vomiting. 20 tablet 0  . triamcinolone (NASACORT) 55 MCG/ACT AERO nasal inhaler Place 2 sprays into the nose daily. 1 Inhaler 0  . metoprolol tartrate (LOPRESSOR) 100 MG tablet Take one tablet by mouth 2 hours prior to cardiac CT. 1 tablet 0   No current facility-administered medications on file prior to visit.    No Known Allergies  Social History   Socioeconomic History  . Marital status: Divorced    Spouse name: Not on file  . Number of children: 3  . Years of education: Not on file  . Highest education level: 12th grade  Occupational History  . Occupation: Herbalist  clerk  Tobacco Use  . Smoking status: Never Smoker  . Smokeless tobacco: Never Used  Vaping Use  . Vaping Use: Never used  Substance and Sexual Activity  . Alcohol use: Yes    Comment: social  . Drug use: No  . Sexual activity: Yes    Partners: Male    Birth control/protection: Surgical    Comment: BTL  Other Topics Concern  . Not on file  Social History Narrative  . Not on file   Social Determinants of Health   Financial Resource Strain: Not on file  Food Insecurity: No Food Insecurity  . Worried About Charity fundraiser in the Last Year: Never true  . Ran Out of Food in the Last Year: Never true  Transportation Needs: No Transportation Needs  . Lack of Transportation (Medical): No  . Lack of Transportation (Non-Medical): No  Physical Activity: Not on file   Stress: Not on file  Social Connections: Not on file  Intimate Partner Violence: Not on file    Family History  Problem Relation Age of Onset  . Hypertension Paternal Grandfather   . Prostate cancer Paternal Grandfather   . Breast cancer Mother 27  . Thyroid disease Mother   . Hyperlipidemia Mother   . Diabetes Father   . Hypertension Father        and aunt and uncles  . Thyroid disease Father   . Hyperlipidemia Father   . Breast cancer Maternal Aunt 43    The following portions of the patient's history were reviewed and updated as appropriate: allergies, current medications, past family history, past medical history, past social history, past surgical history and problem list.  Review of Systems Pertinent items noted in HPI and remainder of comprehensive ROS otherwise negative.   Objective:  BP 117/85   Pulse 97   Ht 5\' 7"  (1.702 m)   Wt 174 lb 3.2 oz (79 kg)   LMP  (LMP Unknown)   BMI 27.28 kg/m  CONSTITUTIONAL: Well-developed, well-nourished female in no acute distress.  HENT:  Normocephalic, atraumatic, External right and left ear normal.  EYES: Conjunctivae and EOM are normal. Pupils are equal, round.  No scleral icterus.  NECK: Normal range of motion, supple, no masses.  Normal thyroid.  SKIN: Skin is warm and dry. No rash noted. Not diaphoretic. No erythema. No pallor. NEUROLOGIC: Alert and oriented to person, place, and time. Normal reflexes, muscle tone coordination. No cranial nerve deficit noted. PSYCHIATRIC: Normal mood and affect. Normal behavior. Normal judgment and thought content. CARDIOVASCULAR: Normal heart rate noted, regular rhythm RESPIRATORY: Clear to auscultation bilaterally. Effort and breath sounds normal, no problems with respiration noted. BREASTS: Symmetric in size. No masses, skin changes, nipple drainage, or lymphadenopathy. ABDOMEN: Soft, no distention noted.  No tenderness, rebound or guarding.  PELVIC: Normal appearing external genitalia;  normal appearing vaginal mucosa and cervix.  No abnormal discharge noted.  Pap smear obtained.  Normal uterine size, no other palpable masses. MUSCULOSKELETAL: Normal range of motion. No tenderness.  No cyanosis, clubbing, or edema.    Assessment and Plan:  1. Encounter for annual routine gynecological examination Menopausal for 7 years Has hot flashes but is not having to change her clothes due to perspiring Client is currently having some as yet undiagnosed cardiac issues and she is not able to exercise at this time.  Is having follow up for this problem. Will follow up with MD at next visit to review current medications and determine if hormonal therapy  would be warranted.  2. History of depression No problems at present but almost tearful when discussing Will refer to office behavioral health to evaluate further  3. Low libido Sex drive decreased in last 2 years - noticed after her IUD was removed. Discussed this is a complex problem with many factors.  Will start with referral to [facility] to see if previous depression and anxiety could be contributing factors. Asked about new medications that may have started as she is on medications for BP and could be contributing. Asked about underlying irritation or upset with partner - she reports this is not a factor.  - Ambulatory referral to [facility]  4. Insomnia due to medical condition Sleep hygiene discussed Melatonin 2 mg at night suggested  5. Dyspareunia in female Pain during sex - bimanual exam shows that cervical and uterine manipulation cause pain and if penis is bumping the cervix during intercourse, it might be painful.  Also has some pain with paplation of muscles in vaginal bilaterally.  Will start with Behavioral Health but pelvic PT may also be needed.  Will reevaluate need for pelvic PT.  Will follow up results of pap smear and manage accordingly. Mammogram to be  scheduled Routine preventative  health maintenance measures emphasized. Please refer to After Visit Summary for other counseling recommendations.    Earlie Server, RN, MSN, NP-BC Nurse Practitioner, [place] for [facility]

## 2020-03-21 NOTE — Progress Notes (Signed)
Here for annual exam. C/o white discharge, denies itching , discomfort and sometimes a smell. Linda,RN

## 2020-03-21 NOTE — Patient Instructions (Signed)
Insomnia Insomnia is a sleep disorder that makes it difficult to fall asleep or stay asleep. Insomnia can cause fatigue, low energy, difficulty concentrating, mood swings, and poor performance at work or school. There are three different ways to classify insomnia:  Difficulty falling asleep.  Difficulty staying asleep.  Waking up too early in the morning. Any type of insomnia can be long-term (chronic) or short-term (acute). Both are common. Short-term insomnia usually lasts for three months or less. Chronic insomnia occurs at least three times a week for longer than three months. What are the causes? Insomnia may be caused by another condition, situation, or substance, such as:  Anxiety.  Certain medicines.  Gastroesophageal reflux disease (GERD) or other gastrointestinal conditions.  Asthma or other breathing conditions.  Restless legs syndrome, sleep apnea, or other sleep disorders.  Chronic pain.  Menopause.  Stroke.  Abuse of alcohol, tobacco, or illegal drugs.  Mental health conditions, such as depression.  Caffeine.  Neurological disorders, such as Alzheimer's disease.  An overactive thyroid (hyperthyroidism). Sometimes, the cause of insomnia may not be known. What increases the risk? Risk factors for insomnia include:  Gender. Women are affected more often than men.  Age. Insomnia is more common as you get older.  Stress.  Lack of exercise.  Irregular work schedule or working night shifts.  Traveling between different time zones.  Certain medical and mental health conditions. What are the signs or symptoms? If you have insomnia, the main symptom is having trouble falling asleep or having trouble staying asleep. This may lead to other symptoms, such as:  Feeling fatigued or having low energy.  Feeling nervous about going to sleep.  Not feeling rested in the morning.  Having trouble concentrating.  Feeling irritable, anxious, or depressed. How  is this diagnosed? This condition may be diagnosed based on:  Your symptoms and medical history. Your health care provider may ask about: ? Your sleep habits. ? Any medical conditions you have. ? Your mental health.  A physical exam. How is this treated? Treatment for insomnia depends on the cause. Treatment may focus on treating an underlying condition that is causing insomnia. Treatment may also include:  Medicines to help you sleep.  Counseling or therapy.  Lifestyle adjustments to help you sleep better. Follow these instructions at home: Eating and drinking  Limit or avoid alcohol, caffeinated beverages, and cigarettes, especially close to bedtime. These can disrupt your sleep.  Do not eat a large meal or eat spicy foods right before bedtime. This can lead to digestive discomfort that can make it hard for you to sleep.   Sleep habits  Keep a sleep diary to help you and your health care provider figure out what could be causing your insomnia. Write down: ? When you sleep. ? When you wake up during the night. ? How well you sleep. ? How rested you feel the next day. ? Any side effects of medicines you are taking. ? What you eat and drink.  Make your bedroom a dark, comfortable place where it is easy to fall asleep. ? Put up shades or blackout curtains to block light from outside. ? Use a white noise machine to block noise. ? Keep the temperature cool.  Limit screen use before bedtime. This includes: ? Watching TV. ? Using your smartphone, tablet, or computer.  Stick to a routine that includes going to bed and waking up at the same times every day and night. This can help you fall asleep faster. Consider   making a quiet activity, such as reading, part of your nighttime routine.  Try to avoid taking naps during the day so that you sleep better at night.  Get out of bed if you are still awake after 15 minutes of trying to sleep. Keep the lights down, but try reading or  doing a quiet activity. When you feel sleepy, go back to bed.   General instructions  Take over-the-counter and prescription medicines only as told by your health care provider.  Exercise regularly, as told by your health care provider. Avoid exercise starting several hours before bedtime.  Use relaxation techniques to manage stress. Ask your health care provider to suggest some techniques that may work well for you. These may include: ? Breathing exercises. ? Routines to release muscle tension. ? Visualizing peaceful scenes.  Make sure that you drive carefully. Avoid driving if you feel very sleepy.  Keep all follow-up visits as told by your health care provider. This is important. Contact a health care provider if:  You are tired throughout the day.  You have trouble in your daily routine due to sleepiness.  You continue to have sleep problems, or your sleep problems get worse. Get help right away if:  You have serious thoughts about hurting yourself or someone else. If you ever feel like you may hurt yourself or others, or have thoughts about taking your own life, get help right away. You can go to your nearest emergency department or call:  Your local emergency services (911 in the [place]  A suicide crisis helpline, such as the [facility] at 7811990562. This is open 24 hours a day. Summary  Insomnia is a sleep disorder that makes it difficult to fall asleep or stay asleep.  Insomnia can be long-term (chronic) or short-term (acute).  Treatment for insomnia depends on the cause. Treatment may focus on treating an underlying condition that is causing insomnia.  Keep a sleep diary to help you and your health care provider figure out what could be causing your insomnia. This information is not intended to replace advice given to you by your health care provider. Make sure you discuss any questions you have with your health care provider. Document  Revised: 12/16/2019 Document Reviewed: 12/16/2019 Elsevier Patient Education  2021 [place].  Dyspareunia, Female Dyspareunia is pain that is associated with sexual activity. This can affect any part of the genitals or lower abdomen. There are many possible causes of this condition. In some cases, diagnosing the cause of dyspareunia can be difficult. This condition can be mild, moderate, or severe. Depending on the cause, dyspareunia may get better with treatment, but may return (recur) over time. What are the causes? The cause of this condition is not always known. However, problems that affect the vulva, vagina, uterus, and other organs may cause dyspareunia. Common causes of this condition include:  Severe pain and tenderness of the vulva when it is touched (vulvodynia).  Vaginal dryness.  Giving birth.  Infection.  Skin changes or conditions.  Side effects of medicines.  Endometriosis. This is when tissue that is like the lining of the uterus grows on the outside of the uterus.  Psychological conditions. These include depression, anxiety, or traumatic experiences.  Allergic reaction.   What increases the risk? The following factors may make you more likely to develop this condition:  History of physical or sexual trauma.  Some medicines.  No longer having a monthly period (menopause).  Having recently given birth.  Taking baths using soaps that have perfumes. These can cause irritation.  Douching. What are the signs or symptoms? The main symptom of this condition is pain in any part of your genitals or lower abdomen during or after sex. This may include:  Irritation, burning, or stinging sensations in your vulva.  Discomfort when your vulva or surrounding area is touched.  Aching and throbbing pain that may be constant.  Pain that gets worse when something is inserted into your vagina. How is this diagnosed? This condition may be diagnosed based  on:  Your symptoms, including where and when your pain occurs.  Your medical history.  A physical exam. A pelvic exam will most likely be done.  Tests that include ultrasound, blood tests, and tests that check the body for infection.  Imaging tests, such as X-ray, MRI, and CT scan. You may be referred to a health care provider who specializes in women's health (gynecologist). How is this treated? Treatment depends on the cause of your condition and your symptoms. In most cases, you may need to stop sexual activity until your symptoms go away or get better. Treatment may include:  Lubricants, ointments, and creams.  Physical therapy.  Massage therapy.  Hormonal therapy.  Medicines to: ? Prevent or fight infection. ? Relieve pain. ? Help numb the area. ? Treat depression (antidepressants).  Counseling, which may include sex therapy.  Surgery. Follow these instructions at home: Lifestyle  Wear cotton underwear.  Use water-based lubricants as needed during sex. Avoid oil-based lubricants.  Do not use any products that can cause irritation. This may include certain condoms, spermicides, lubricants, soaps, tampons, vaginal sprays, or douches.  Always practice safe sex. Use a condom to prevent sexually transmitted infections (STIs).  Talk freely with your partner about your condition. General instructions  Take or apply over-the-counter and prescription medicines only as told by your health care provider.  Urinate before you have sex.  Consider joining a support group.  Get the results of any tests you have done. Ask your health care provider, or the department that is doing the procedure, when your results will be ready.  Keep all follow-up visits as told by your health care provider. This is important. Contact a health care provider if:  You have vaginal bleeding after having sex.  You develop a lump at the opening of your vagina even if the lump is  painless.  You have: ? Abnormal discharge from your vagina. ? Vaginal dryness. ? Itchiness or irritation of your vulva or vagina. ? A new rash. ? Symptoms that get worse or do not improve with treatment. ? A fever. ? Pain when you urinate. ? Blood in your urine. Get help right away if:  You have severe pain in your abdomen during or shortly after sex.  You pass out after sex. Summary  Dyspareunia is pain that is associated with sexual activity. This can affect any part of the genitals or lower abdomen.  There are many causes of this condition. Treatment depends on the cause and your symptoms. In most cases, you may need to stop sexual activity until your symptoms improve.  Take or apply over-the-counter and prescription medicines only as told by your health care provider.  Contact a health care provider if your symptoms get worse or do not improve with treatment.  Keep all follow-up visits as told by your health care provider. This is important. This information is not intended to replace advice given to you by your health  care provider. Make sure you discuss any questions you have with your health care provider. Document Revised: 03/18/2019 Document Reviewed: 04/13/2018 Elsevier Patient Education  2021 [place].  High-Fiber Eating Plan Fiber, also called dietary fiber, is a type of carbohydrate. It is found foods such as fruits, vegetables, whole grains, and beans. A high-fiber diet can have many health benefits. Your health care provider may recommend a high-fiber diet to help:  Prevent constipation. Fiber can make your bowel movements more regular.  Lower your cholesterol.  Relieve the following conditions: ? Inflammation of veins in the anus (hemorrhoids). ? Inflammation of specific areas of the digestive tract (uncomplicated diverticulosis). ? A problem of the large intestine, also called the colon, that sometimes causes pain and diarrhea (irritable bowel syndrome,  or IBS).  Prevent overeating as part of a weight-loss plan.  Prevent heart disease, type 2 diabetes, and certain cancers. What are tips for following this plan? Reading food labels  Check the nutrition facts label on food products for the amount of dietary fiber. Choose foods that have 5 grams of fiber or more per serving.  The goals for recommended daily fiber intake include: ? Men (age 30 or younger): 34-38 g. ? Men (over age 83): 28-34 g. ? Women (age 52 or younger): 25-28 g. ? Women (over age 48): 22-25 g. Your daily fiber goal is _____________ g.   Shopping  Choose whole fruits and vegetables instead of processed forms, such as apple juice or applesauce.  Choose a wide variety of high-fiber foods such as avocados, lentils, oats, and kidney beans.  Read the nutrition facts label of the foods you choose. Be aware of foods with added fiber. These foods often have high sugar and sodium amounts per serving. Cooking  Use whole-grain flour for baking and cooking.  Cook with brown rice instead of white rice. Meal planning  Start the day with a breakfast that is high in fiber, such as a cereal that contains 5 g of fiber or more per serving.  Eat breads and cereals that are made with whole-grain flour instead of refined flour or white flour.  Eat brown rice, bulgur wheat, or millet instead of white rice.  Use beans in place of meat in soups, salads, and pasta dishes.  Be sure that half of the grains you eat each day are whole grains. General information  You can get the recommended daily intake of dietary fiber by: ? Eating a variety of fruits, vegetables, grains, nuts, and beans. ? Taking a fiber supplement if you are not able to take in enough fiber in your diet. It is better to get fiber through food than from a supplement.  Gradually increase how much fiber you consume. If you increase your intake of dietary fiber too quickly, you may have bloating, cramping, or  gas.  Drink plenty of water to help you digest fiber.  Choose high-fiber snacks, such as berries, raw vegetables, nuts, and popcorn. What foods should I eat? Fruits Berries. Pears. Apples. Oranges. Avocado. Prunes and raisins. Dried figs. Vegetables Sweet potatoes. Spinach. Kale. Artichokes. Cabbage. Broccoli. Cauliflower. Green peas. Carrots. Squash. Grains Whole-grain breads. Multigrain cereal. Oats and oatmeal. Brown rice. Barley. Bulgur wheat. [place]. Quinoa. Bran muffins. Popcorn. Rye wafer crackers. Meats and other proteins Navy beans, kidney beans, and pinto beans. Soybeans. Split peas. Lentils. Nuts and seeds. Dairy Fiber-fortified yogurt. Beverages Fiber-fortified soy milk. Fiber-fortified orange juice. Other foods Fiber bars. The items listed above may not be a complete list  of recommended foods and beverages. Contact a dietitian for more information. What foods should I avoid? Fruits Fruit juice. Cooked, strained fruit. Vegetables Fried potatoes. Canned vegetables. Well-cooked vegetables. Grains White bread. Pasta made with refined flour. White rice. Meats and other proteins Fatty cuts of meat. Fried chicken or fried fish. Dairy Milk. Yogurt. Cream cheese. Sour cream. Fats and oils Butters. Beverages Soft drinks. Other foods Cakes and pastries. The items listed above may not be a complete list of foods and beverages to avoid. Talk with your dietitian about what choices are best for you. Summary  Fiber is a type of carbohydrate. It is found in foods such as fruits, vegetables, whole grains, and beans.  A high-fiber diet has many benefits. It can help to prevent constipation, lower blood cholesterol, aid weight loss, and reduce your risk of heart disease, diabetes, and certain cancers.  Increase your intake of fiber gradually. Increasing fiber too quickly may cause cramping, bloating, and gas. Drink plenty of water while you increase the amount of fiber you  consume.  The best sources of fiber include whole fruits and vegetables, whole grains, nuts, seeds, and beans. This information is not intended to replace advice given to you by your health care provider. Make sure you discuss any questions you have with your health care provider. Document Revised: 06/10/2019 Document Reviewed: 06/10/2019 Elsevier Patient Education  2021 [employer].

## 2020-03-22 LAB — CERVICOVAGINAL ANCILLARY ONLY
Bacterial Vaginitis (gardnerella): POSITIVE — AB
Candida Glabrata: NEGATIVE
Candida Vaginitis: NEGATIVE
Chlamydia: NEGATIVE
Comment: NEGATIVE
Comment: NEGATIVE
Comment: NEGATIVE
Comment: NEGATIVE
Comment: NEGATIVE
Comment: NORMAL
Neisseria Gonorrhea: NEGATIVE
Trichomonas: NEGATIVE

## 2020-03-23 ENCOUNTER — Other Ambulatory Visit: Payer: Self-pay | Admitting: Nurse Practitioner

## 2020-03-23 MED ORDER — METRONIDAZOLE 0.75 % VA GEL: 1.0000 | Freq: Every day | VAGINAL | 0 refills | Status: AC

## 2020-03-23 MED FILL — METRONIDAZOLE 0.75 % GEL: 0.75 | 5 days supply | Qty: 70 | Fill #0

## 2020-03-23 NOTE — Addendum Note (Signed)
Addended by: Virginia Rochester on: 03/23/2020 03:14 PM   Modules accepted: Orders

## 2020-03-24 NOTE — BH Specialist Note (Signed)
Integrated Behavioral Health via Telemedicine Visit  03/24/2020 Brandy [name] [id number]  Number of [place] visits: 1 Session Start time: 8:47  Session End time: 9:38 Total time: 77  Referring Provider: Earlie Server, NP Patient/Family location: Home [facility] Provider location: [facility] [place] [facility]  All persons participating in visit: Patient Brandy [name] and [place]   Types of Service: Individual psychotherapy  I connected with Brandy [name] and/or Brandy [name]'s n/a by Telephone  (Video is Tree surgeon) and verified that I am speaking with the correct person using two identifiers.Discussed confidentiality: Yes   I discussed the limitations of telemedicine and the availability of in person appointments.  Discussed there is a possibility of technology failure and discussed alternative modes of communication if that failure occurs.  I discussed that engaging in this telemedicine visit, they consent to the provision of behavioral healthcare and the services will be billed under their insurance.  Patient and/or legal guardian expressed understanding and consented to Telemedicine visit: Yes   Presenting Concerns: Patient and/or family reports the following symptoms/concerns: Pt states her primary symptoms are worry, irritability, sleep difficulty; worry is affecting performance at work and relationship with grown children and boyfriend.  Duration of problem: Increase over one month; Severity of problem: moderate  Patient and/or Family's Strengths/Protective Factors: Sense of purpose  Goals Addressed: Patient will: 1.  Reduce symptoms of: anxiety, depression and stress  2.  Increase knowledge and/or ability of: self-management skills and stress reduction  3.  Demonstrate ability to: Increase healthy adjustment to current life circumstances  Progress towards  Goals: Ongoing  Interventions: Interventions utilized:  CBT Cognitive Behavioral Therapy Standardized Assessments completed: Not Needed  Patient and/or Family Response: Pt agrees to treatment plan  Assessment: Patient currently experiencing Adjustment disorder with mixed anxiety and depressed mood.   Patient may benefit from psychoeducation and brief therapeutic interventions regarding coping with symptoms of anxiety, depression, stress .  Plan: 1. Follow up with behavioral health clinician on : Two weeks 2. Behavioral recommendations:  -Begin Worry Time strategy today; practice daily for two weeks. (Set phone timer for start and end time) -Prioritize healthy sleep for next two weeks (use sleep app if helpful) -Consider setting healthy boundaries with loved ones, as discussed 3. Referral(s): [place] (In Clinic)  I discussed the assessment and treatment plan with the patient and/or parent/guardian. They were provided an opportunity to ask questions and all were answered. They agreed with the plan and demonstrated an understanding of the instructions.   They were advised to call back or seek an in-person evaluation if the symptoms worsen or if the condition fails to improve as anticipated.  [name], LCSW   Depression screen [facility] 2/9 03/21/2020 02/01/2020 05/27/2019 11/24/2018 08/19/2018  Decreased Interest 1 0 0 1 1  Down, Depressed, Hopeless 1 0 0 2 1  PHQ - 2 Score 2 0 0 3 2  Altered sleeping 2 1 0 3 3  Tired, decreased energy 2 1 0 2 2  Change in appetite 1 0 0 3 2  Feeling bad or failure about yourself  0 0 0 1 1  Trouble concentrating 1 1 0 2 1  Moving slowly or fidgety/restless 0 0 0 0 1  Suicidal thoughts 0 0 - 0 0  PHQ-9 Score 8 3 0 14 12  Some recent data might be hidden   GAD 7 : Generalized Anxiety Score 03/21/2020 02/01/2020 05/27/2019  11/24/2018  Nervous, Anxious, on Edge 1 1 0 1  Control/stop worrying 2 1 0 2  Worry too much -  different things 2 1 0 2  Trouble relaxing 1 1 0 1  Restless 1 0 1 1  Easily annoyed or irritable 2 1 1 2   Afraid - awful might happen 0 0 0 0  Total GAD 7 Score 9 5 2  9

## 2020-03-29 ENCOUNTER — Other Ambulatory Visit: Payer: Self-pay

## 2020-03-29 ENCOUNTER — Ambulatory Visit (INDEPENDENT_AMBULATORY_CARE_PROVIDER_SITE_OTHER): Payer: 59 | Admitting: Clinical

## 2020-03-29 DIAGNOSIS — F4323 Adjustment disorder with mixed anxiety and depressed mood: Secondary | ICD-10-CM

## 2020-03-29 DIAGNOSIS — R6882 Decreased libido: Secondary | ICD-10-CM

## 2020-03-29 NOTE — Patient Instructions (Signed)
[facility] [address] [place], [place] [address] [phone] (main office) [phone] (Harvin Konicek's office)  /Emotional Wellbeing Apps and Websites Here are a few free apps meant to help you to help yourself.  To find, try searching on the internet to see if the app is offered on Apple/Android devices. If your first choice doesn't come up on your device, the good news is that there are many choices! Play around with different apps to see which ones are helpful to you.    Calm This is an app meant to help increase calm feelings. Includes info, strategies, and tools for tracking your feelings.      Calm Harm  This app is meant to help with self-harm. Provides many 5-minute or 15-min coping strategies for doing instead of hurting yourself.       Healthy Minds Health Minds is a problem-solving tool to help deal with emotions and cope with stress you encounter wherever you are.      MindShift This app can help people cope with anxiety. Rather than trying to avoid anxiety, you can make an important shift and face it.      MY3  MY3 features a support system, safety plan and resources with the goal of offering a tool to use in a time of need.       My Life My Voice  This mood journal offers a simple solution for tracking your thoughts, feelings and moods. Animated emoticons can help identify your mood.       Relax Melodies Designed to help with sleep, on this app you can mix sounds and meditations for relaxation.      Smiling Mind Smiling Mind is meditation made easy: it's a simple tool that helps put a smile on your mind.        Stop, Breathe & Think  A friendly, simple guide for people through meditations for mindfulness and compassion.  Stop, Breathe and Think Kids Enter your current feelings and choose a "mission" to help you cope. Offers videos for certain moods instead of just sound recordings.       Team  [place] The goal of this tool is to help teens change how they think, act, and react. This app helps you focus on your own good feelings and experiences.      The Virtual Hope Box The Virtual Hope Box (VHB) contains simple tools to help patients with coping, relaxation, distraction, and positive thinking.     

## 2020-04-04 NOTE — BH Specialist Note (Signed)
Integrated Behavioral Health via Telemedicine Visit  04/04/2020 Brandy [name] [id number]  Number of [place] visits: 2 Session Start time: 9:18  Session End time: 9:52 Total time: 86  Referring Provider: Earlie Server, NP Patient/Family location: Home [facility] Provider location: [facility] [place] [facility]  All persons participating in visit: Patient Brandy [name] and Denali   Types of Service: Individual psychotherapy  I connected with [place] Main Lizama and/or [place] Machia's n/a by Video  (Video is Caregility application) and verified that I am speaking with the correct person using two identifiers.Discussed confidentiality: Yes   I discussed the limitations of telemedicine and the availability of in person appointments.  Discussed there is a possibility of technology failure and discussed alternative modes of communication if that failure occurs.  I discussed that engaging in this telemedicine visit, they consent to the provision of behavioral healthcare and the services will be billed under their insurance.  Patient and/or legal guardian expressed understanding and consented to Telemedicine visit: Yes   Presenting Concerns: Patient and/or family reports the following symptoms/concerns: Pt states her primary concern today is an increase in stress, anxiety, sleep difficulty, as one son moved in with her and daughter made suicide attempt, both in past week; pt requests to restart anxiety medication previously taken to help manage during this time.  Duration of problem: Increase in past week; Severity of problem: moderate  Patient and/or Family's Strengths/Protective Factors: Sense of purpose  Goals Addressed: Patient will: 1.  Reduce symptoms of: anxiety, depression and stress  2.  Demonstrate ability to: Increase healthy adjustment to current life circumstances  Progress towards  Goals: Ongoing  Interventions: Interventions utilized:  Supportive Counseling Standardized Assessments completed: Not Needed  Patient and/or Family Response: Pt agrees to treatment plan  Assessment: Patient currently experiencing Adjustment disorder with mixed anxiety and depressed mood.   Patient may benefit from continued psychoeducation and brief therapeutic interventions regarding coping with symptoms of depression, anxiety, stress  Plan: 1. Follow up with behavioral health clinician on : Two weeks; call Roselyn Reef as needed at [phone] 2. Behavioral recommendations:  -Continue using self-coping strategies that have been helpful to manage life stress (healthy boundaries, worry time to focus on things you can control vs things outside your control) -Discuss with PCP starting back on previous medication for anxiety -Consider sharing resources with daughter (on After Visit Summary) 3. Referral(s): [facility] (In Clinic)  I discussed the assessment and treatment plan with the patient and/or parent/guardian. They were provided an opportunity to ask questions and all were answered. They agreed with the plan and demonstrated an understanding of the instructions.   They were advised to call back or seek an in-person evaluation if the symptoms worsen or if the condition fails to improve as anticipated.  [name], LCSW   Depression screen [facility] 2/9 03/21/2020 02/01/2020 05/27/2019 11/24/2018 08/19/2018  Decreased Interest 1 0 0 1 1  Down, Depressed, Hopeless 1 0 0 2 1  PHQ - 2 Score 2 0 0 3 2  Altered sleeping 2 1 0 3 3  Tired, decreased energy 2 1 0 2 2  Change in appetite 1 0 0 3 2  Feeling bad or failure about yourself  0 0 0 1 1  Trouble concentrating 1 1 0 2 1  Moving slowly or fidgety/restless 0 0 0 0 1  Suicidal thoughts 0 0 - 0 0  PHQ-9 Score 8 3 0  14 12  Some recent data might be hidden   GAD 7 : Generalized Anxiety Score 03/21/2020 02/01/2020  05/27/2019 11/24/2018  Nervous, Anxious, on Edge 1 1 0 1  Control/stop worrying 2 1 0 2  Worry too much - different things 2 1 0 2  Trouble relaxing 1 1 0 1  Restless 1 0 1 1  Easily annoyed or irritable 2 1 1 2   Afraid - awful might happen 0 0 0 0  Total GAD 7 Score 9 5 2  9

## 2020-04-10 MED FILL — HYDROCHLOROTHIAZIDE 25 MG T: 25 | 30 days supply | Qty: 30 | Fill #2

## 2020-04-10 MED FILL — LOSARTAN POTASSIUM 50 MG TA: 50 | 30 days supply | Qty: 30 | Fill #2

## 2020-04-10 MED FILL — OMEPRAZOLE 20 MG CAP: 20 | 30 days supply | Qty: 30 | Fill #1

## 2020-04-12 ENCOUNTER — Ambulatory Visit: Payer: 59 | Admitting: Clinical

## 2020-04-12 DIAGNOSIS — F4323 Adjustment disorder with mixed anxiety and depressed mood: Secondary | ICD-10-CM

## 2020-04-12 DIAGNOSIS — Z658 Other specified problems related to psychosocial circumstances: Secondary | ICD-10-CM

## 2020-04-12 NOTE — Patient Instructions (Signed)
[facility] °[address] °[place], [place] [address] °[phone] (main office) °[phone] (Malakai Schoenherr's office) ° °Behavioral Health Resources:  ° °What if I or someone I know is in crisis? ° °If you are thinking about harming yourself or having thoughts of suicide, or if you know someone who is, seek help right away. ° °Call your doctor or mental health care provider. ° °Call 911 or go to a hospital emergency room to get immediate help, or ask a friend or family member to help you do these things; IF YOU ARE IN [place], YOU MAY GO TO WALK-IN URGENT CARE 24/7 at [facility] (see below) ° °Call the [place] National Suicide Prevention Lifeline’s toll-free, 24-hour hotline at 1-800-273-TALK (1-800-273-8255) or TTY: 1-800-799-4 TTY (1-800-799-4889) to talk to a trained counselor. ° °If you are in crisis, make sure you are not left alone.  ° °If someone else is in crisis, make sure he or she is not left alone ° ° °24 Hour :  ° °[facility]  °[address], [place], [place] [address] [phone] or [phone] °WALK-IN URGENT CARE 24/7 ° °[facility] Mobile Crisis: 1-877-626-1772 ° °[place] National Suicide Hotline: 1-800-273-8255 ° °[facility] Crisis [facility] °(Domestic Violence, Rape & Victim Assistance)  336-273-7273 ° °[facility]  °[address] [place], [place]  [address]   1-855-788-8787 or 336-676-6840  ° °[facility] [place] [facility]: 336-899-1505 (8am-4pm) or 1-866- 261-5769 (after hours)      ° ° °[facility] °24/7 Walk-in Clinic, [address], [place], [place]  [phone] °Fax: [phone] [facility] °*Interpreters available °*Accepts all insurance and uninsured for Urgent Care needs °*Accepts Medicaid and uninsured for outpatient treatment  ° °[facility]   °Mon-Fri: 8am-5pm °[address], [place], [place] [phone](phone); 336-272-9885(fax) [facility]  °*Accepts Medicare ° °[facility] °Mon, Tues, Thurs, Fri: 8am-4pm °[address], [place], [place] [address] °336-292-1510 (phone); 336-292-0679 (fax) °[facility]  °*Accepts Medicare ° °[facility] °Mon-Fri: 9am-5pm  °[address], [place], [place] °336-540-9400 (phone); 336-540-9454  °[facility]  °*Accepts Medicaid ° °[facility] °[address], [place], [place]  °336-274-2040 °http://[facility].com  ° °[facility] °Mon-Fri, 8:30am-12pm/1pm-2:30pm °[address], [place], [place] 336-387-6161 (phone); 336-387-9167 (fax) °www.[facility].org  °*Accepts Medicaid, sliding-scale*Bilingual services available ° °[facility] °Mon-Fri, 8am-7pm °[address], [place], [place]  °336-899-8800(phone); 336-899-8811(fax) °[facility].org  °*Accepts Medicaid *Bilingual services available ° °[facility] °Mon-Fri: 8am-5pm, Saturday by appointment only °[address], [place], [place] °336-294-1349 (phone); 336-292-6711 (fax) °www.[facility]  ° °[facility] °[address], [place], [place] °336-429-5600 °[facility]  °*Free & reduced services for uninsured and underinsured individuals °*Bilingual services for Spanish-speaking clients 21 and under ° °[facility] °24/7 Walk-in Clinic, [address], [place], [place] °336-676-6409(phone); 336-676-6409(fax) °www.[facility]  °*Bring your own interpreter at first visit °*Accepts Medicare and Medicaid ° °[facility] °Mon-Fri: 9am-5:30pm °[address], [place], [place] °336-505-9494 (phone), [phone] (fax) °After hours crisis line:  336-763-1165 °[facility]  °*Accepts Medicare and Medicaid ° °[facility] °Mon-Thurs, 8am-6pm °[address], [place], [place]  °336-288-1484 (phone); 336-288-0738 (fax) °[facility]://[facility]  °*Subsidized costs available ° °[facility] °Mon-Fri: 8am-4pm °[address], ,  °336-834-9664(phone); 336-834-9698(fax) °[facility].com  °*Accepts Medicaid ° °[facility] °Same day access hours: Mon-Fri, 8:30-3pm °Crisis hours: Mon-Fri,   8am-5pm °[address], [place], [place] °(336) [phone] ° °[facility] °Same day access hours: Mon-Fri, 8:30-3pm °Crisis hours: Mon-Fri, 8am-8pm °[address], [place], [place] °336-899-1505 (phone); 336-899-1513 (fax) °www.[facility].org  °*Accepts Medicaid and Medicare ° °[facility] °Mon, Wed, Fri: 9am-9pm °Tues, Thurs: 9am-6pm °[address], [place], [place]  °336-379-7146 (phone); 336-379-7145 (fax) °[facility]://[facility].com  °*(Accepts Medicare and Medicaid; payment plans available)*Bilingual services available ° °[facility] °[address], [place], [place] °336-542-2076 (phone); 336-272-1182 (fax) °[facility]  ° °[facility] °[address], [place], [place]  °336-663-6570  °www.[facility]  °*Bilingual services available ° °[facility] °Mon-Thurs: 8am-8pm °[address], [place], [place] °336-285-7173 (phone); 336-285-7174 (fax) °https://[facility].com/index.html  °*Accepts Medicaid*Bilingual services available ° °[facility] °[address] [place], [place] °336-617-8910 (phone) °https://[facility].com  °*Accepts Medicaid °*Bilingual services available ° °[facility] °Mon-Fri, 9am-4:45pm °[address], [place], [place] °336-288-9190 (phone); 336-450-4318  (fax) °http://[facility].com  °*Accepts Medicare ° °[facility] °Mon-Thurs: 8:30-8pm, Fri: 8:30am-7pm °[address], [place], [place] (3rd floor) °336-334-5662 (phone); 336-334-5754 (fax) °[facility]://[email or web address]/clinic  °*Accepts Medicaid; income-based reduced rates available ° °[facility] °Mon-Fri: 8am-5pm °[address], [place], [place] [address] °336-542-2884 (phone); 336-542-2885 (fax) °http://[facility].com  °*Accepts Medicaid*Bilingual services available ° ° °[facility]  °[address], [place] °336-373-1402 www.[facility]ag.org  °*Provides direct services to individuals in recovery from mental illness, including support groups, recovery skills classes, and one on one peer support ° °[facility] (National [facility] on [facility]) °[place] [facility] helpline: 336-370-4264  °[facility] [place] helpline: 1-800-451-9682 °https://namigu[facility].org  °*A community hub for information relating to local resources and services for the friends and families of individuals living alongside a mental health condition, as well as the individuals themselves. Classes and support groups also provided  ° ° °

## 2020-04-24 ENCOUNTER — Other Ambulatory Visit: Payer: Self-pay

## 2020-04-24 ENCOUNTER — Encounter: Payer: Self-pay | Admitting: *Deleted

## 2020-04-24 ENCOUNTER — Encounter: Payer: Self-pay | Admitting: Internal Medicine

## 2020-04-24 ENCOUNTER — Ambulatory Visit (INDEPENDENT_AMBULATORY_CARE_PROVIDER_SITE_OTHER): Payer: 59

## 2020-04-24 ENCOUNTER — Ambulatory Visit (INDEPENDENT_AMBULATORY_CARE_PROVIDER_SITE_OTHER): Payer: 59 | Admitting: Internal Medicine

## 2020-04-24 VITALS — BP 116/80 | HR 94 | Ht 67.0 in | Wt 172.0 lb

## 2020-04-24 DIAGNOSIS — R079 Chest pain, unspecified: Secondary | ICD-10-CM | POA: Insufficient documentation

## 2020-04-24 DIAGNOSIS — R0609 Other forms of dyspnea: Secondary | ICD-10-CM

## 2020-04-24 DIAGNOSIS — R0600 Dyspnea, unspecified: Secondary | ICD-10-CM

## 2020-04-24 DIAGNOSIS — R002 Palpitations: Secondary | ICD-10-CM | POA: Diagnosis not present

## 2020-04-24 DIAGNOSIS — E7801 Familial hypercholesterolemia: Secondary | ICD-10-CM | POA: Diagnosis not present

## 2020-04-24 NOTE — BH Specialist Note (Deleted)
Pt did not arrive to video visit and did not answer the phone ; Left HIPPA-compliant message to call back Jamie from [facility] at [phone] (main office) or [phone] (Jamie's office).  ; left MyChart message for patient.      

## 2020-04-24 NOTE — Patient Instructions (Addendum)
Medication Instructions:  Your physician recommends that you continue on your current medications as directed. Please refer to the Current Medication list given to you today.  *If you need a refill on your cardiac medications before your next appointment, please call your pharmacy*   Lab Work: IN 1 WEEK: BNP, Fasting lipid panel, and liver function test If you have labs (blood work) drawn today and your tests are completely normal, you will receive your results only by: Marland Kitchen MyChart Message (if you have MyChart) OR . A paper copy in the mail If you have any lab test that is abnormal or we need to change your treatment, we will call you to review the results.   Testing/Procedures: Your physician has requested that you have a Cardiopulmonary Exercise Test.  Your physician has requested that you have an echocardiogram in 5 months. Echocardiography is a painless test that uses sound waves to create images of your heart. It provides your doctor with information about the size and shape of your heart and how well your heart's chambers and valves are working. This procedure takes approximately one hour. There are no restrictions for this procedure.     Follow-Up: At [facility], you and your health needs are our priority.  As part of our continuing mission to provide you with exceptional heart care, we have created designated Provider Care Teams.  These Care Teams include your primary Cardiologist (physician) and Advanced Practice Providers (APPs -  Physician Assistants and Nurse Practitioners) who all work together to provide you with the care you need, when you need it.  We recommend signing up for the patient portal called "MyChart".  Sign up information is provided on this After Visit Summary.  MyChart is used to connect with patients for Virtual Visits (Telemedicine).  Patients are able to view lab/test results, encounter notes, upcoming appointments, etc.  Non-urgent messages can be sent to  your provider as well.   To learn more about what you can do with MyChart, go to [email or web address].    Your next appointment:   6 month(s)  The format for your next appointment:   In Person  Provider:   You may see Gasper Sells, MD or one of the following Advanced Practice Providers on your designated Care Team:    Melina Copa, PA-C  Ermalinda Barrios, PA-C    Other Instructions  Bryn Gulling- Long Term Monitor Instructions   Your physician has requested you wear your ZIO patch monitor___14____days.   This is a single patch monitor.  [employer] supplies one patch monitor per enrollment.  Additional stickers are not available.   Please do not apply patch if you will be having a Nuclear Stress Test, Echocardiogram, Cardiac CT, MRI, or Chest Xray during the time frame you would be wearing the monitor. The patch cannot be worn during these tests.  You cannot remove and re-apply the ZIO XT patch monitor.   Your ZIO patch monitor will be sent USPS Priority mail from [facility] directly to your home address. The monitor may also be mailed to a PO BOX if home delivery is not available.   It may take 3-5 days to receive your monitor after you have been enrolled.   Once you have received you monitor, please review enclosed instructions.  Your monitor has already been registered assigning a specific monitor serial # to you.   Applying the monitor   Shave hair from upper left chest.   Hold abrader disc by orange tab.  Rub abrader  in 40 strokes over left upper chest as indicated in your monitor instructions.   Clean area with 4 enclosed alcohol pads .  Use all pads to assure are is cleaned thoroughly.  Let dry.   Apply patch as indicated in monitor instructions.  Patch will be place under collarbone on left side of chest with arrow pointing upward.   Rub patch adhesive wings for 2 minutes.Remove white label marked "1".  Remove white label marked "2".  Rub patch adhesive wings for 2  additional minutes.   While looking in a mirror, press and release button in center of patch.  A small green light will flash 3-4 times .  This will be your only indicator the monitor has been turned on.     Do not shower for the first 24 hours.  You may shower after the first 24 hours.   Press button if you feel a symptom. You will hear a small click.  Record Date, Time and Symptom in the Patient Log Book.   When you are ready to remove patch, follow instructions on last 2 pages of Patient Log Book.  Stick patch monitor onto last page of Patient Log Book.   Place Patient Log Book in [place] box.  Use locking tab on box and tape box closed securely.  The [employer] and [employer] has [employer] on it.  Please place in mailbox as soon as possible.  Your physician should have your test results approximately 7 days after the monitor has been mailed back to [facility].   Call [place] at [phone] if you have questions regarding your ZIO XT patch monitor.  Call them immediately if you see an orange light blinking on your monitor.   If your monitor falls off in less than 4 days contact our Monitor department at [phone].  If your monitor becomes loose or falls off after 4 days call [employer] at [phone] for suggestions on securing your monitor.

## 2020-04-24 NOTE — Progress Notes (Signed)
Patient ID: Brandy [name], female   DOB: 15-Sep-1970, 50 y.o.   MRN: [id number] Patient enrolled for [employer] to ship a 14 day ZIO XT long term holter monitor to her home.

## 2020-04-24 NOTE — Progress Notes (Signed)
Cardiology Office Note:    Date:  04/24/2020   ID:  Brandy [name], DOB Dec 04, 1970, MRN [id number]  PCP:  Charlott Rakes, MD  [facility] Cardiologist:  No primary care provider on file.  [facility] Electrophysiologist:  None   CC: Chest Pain Consulted for the evaluation of chest pain syndrome at the behest of Charlott Rakes, MD  History of Present Illness:    Brandy [name] is a 50 y.o. female with a hx of HTN, Familial Hypercholesterolemia, Carpal Tunnel Syndrome, who presents for evaluation 02/22/20.  In interim of this visit, patient negative CCTA.  Seen 04/24/20.  Patient notes that she is doing about the same.  Since last visit notes started working out.  Relevant interval testing or therapy include negative CCTA.  There are no interval hospital/ED visit.    Still has sternal chest pain.  This chest pain is in the mornings and the events.  Notes fatigue and cannot do the working out she wanted to.  Breathing is ok except for with activity.  Notes improvement in her exertional funny heart beats (only slightly, maybe).  Ambulatory blood pressure not done.   Past Medical History:  Diagnosis Date  . Anxiety   . Arthritis   . Depression   . Disc degeneration, lumbar 2011  . Hypertension   . IBS (irritable bowel syndrome)     Past Surgical History:  Procedure Laterality Date  . TUBAL LIGATION      Current Medications: Current Meds  Medication Sig  . amLODipine (NORVASC) 2.5 MG tablet TAKE 1 TABLET (2.5 MG TOTAL) BY MOUTH DAILY.  Marland Kitchen aspirin EC 81 MG tablet Take 1 tablet (81 mg total) by mouth daily. Swallow whole.  Marland Kitchen atorvastatin (LIPITOR) 20 MG tablet Take 1 tablet (20 mg total) by mouth daily.  . cyclobenzaprine (FLEXERIL) 10 MG tablet Take 1 tablet (10 mg total) by mouth 2 (two) times daily as needed for muscle spasms.  Marland Kitchen dicyclomine (BENTYL) 10 MG capsule Take 1 capsule (10 mg total) by mouth 3 (three) times daily before meals.  .  hydrochlorothiazide (HYDRODIURIL) 25 MG tablet Take 1 tablet (25 mg total) by mouth daily.  Marland Kitchen ibuprofen (ADVIL) 600 MG tablet Take 1 tablet (600 mg total) by mouth 2 (two) times daily as needed.  Marland Kitchen losartan (COZAAR) 50 MG tablet Take 1 tablet (50 mg total) by mouth daily.  . nitroGLYCERIN (NITROSTAT) 0.4 MG SL tablet Place 1 tablet (0.4 mg total) under the tongue every 5 (five) minutes as needed for chest pain.  Marland Kitchen omeprazole (PRILOSEC) 20 MG capsule Take 1 capsule (20 mg total) by mouth daily.  . ondansetron (ZOFRAN ODT) 4 MG disintegrating tablet Take 1 tablet (4 mg total) by mouth every 8 (eight) hours as needed for nausea or vomiting.  . triamcinolone (NASACORT) 55 MCG/ACT AERO nasal inhaler Place 2 sprays into the nose daily.     Allergies:   Patient has no known allergies.   Social History   Socioeconomic History  . Marital status: Divorced    Spouse name: Not on file  . Number of children: 3  . Years of education: Not on file  . Highest education level: 12th grade  Occupational History  . Occupation: Chartered certified accountant  Tobacco Use  . Smoking status: Never Smoker  . Smokeless tobacco: Never Used  Vaping Use  . Vaping Use: Never used  Substance and Sexual Activity  . Alcohol use: Yes    Comment: social  . Drug use: No  .  Sexual activity: Yes    Partners: Male    Birth control/protection: Surgical    Comment: BTL  Other Topics Concern  . Not on file  Social History Narrative  . Not on file   Social Determinants of Health   Financial Resource Strain: Not on file  Food Insecurity: No Food Insecurity  . Worried About Charity fundraiser in the Last Year: Never true  . Ran Out of Food in the Last Year: Never true  Transportation Needs: No Transportation Needs  . Lack of Transportation (Medical): No  . Lack of Transportation (Non-Medical): No  Physical Activity: Not on file  Stress: Not on file  Social Connections: Not on file    Social:  Presents with her husband; her  brother in law his a CT Transplant Surgeon at [place] History: The patient's family history includes Breast cancer (age of onset: 45) in her maternal aunt; Breast cancer (age of onset: 8) in her mother; Diabetes in her father; Hyperlipidemia in her father and mother; Hypertension in her father and paternal grandfather; Prostate cancer in her paternal grandfather; Thyroid disease in her father and mother. History of coronary artery disease notable for cousin. History of heart failure notable for no members. History of arrhythmia notable for no members. No history of bicuspid aortic valve or aortic aneurysm or dissection.  ROS:   Please see the history of present illness.     All other systems reviewed and are negative.  EKGs/Labs/Other Studies Reviewed:    The following studies were reviewed today:  EKG:   02/01/20: SR rate 87, LAE, borderline evidence for ant. infarct  Transthoracic Echocardiogram: Date:07/09/2016 Results: Left ventricle: The cavity size was normal. Systolic function was  normal. The estimated ejection fraction was in the range of 60%  to 65%. Wall motion was normal; there were no regional wall  motion abnormalities. Left ventricular diastolic function  parameters were normal.  - Aortic valve: Transvalvular velocity was within the normal range.  There was no stenosis. There was no regurgitation.  - Mitral valve: Transvalvular velocity was within the normal range.  There was no evidence for stenosis. There was no regurgitation.  - Right ventricle: The cavity size was normal. Wall thickness was  normal. Systolic function was normal.  - Atrial septum: No defect or patent foramen ovale was identified.  - Tricuspid valve: There was no regurgitation.   CCTA:  Date:03/15/20 Results:  MPRESSION: 1. Coronary calcium score of 0. This was 1st percentile for age, sex, and race matched control.  2. Normal coronary origin with right  dominance.  3. Ascending aorta borderline dilation: 38.5 mm with ascending aortic index 2.3.  4. Tortuous coronary arteries. CAD-RADS 0. No evidence of CAD (0%). Consider non-atherosclerotic causes of chest pain.  Recent Labs: 06/09/2019: Hemoglobin 15.1; Platelets 309.0 02/22/2020: BUN 11; Creatinine, Ser 0.86; NT-Pro BNP 8; Potassium 4.1; Sodium 143  Recent Lipid Panel    Component Value Date/Time   CHOL 301 (H) 02/02/2020 0940   TRIG 140 02/02/2020 0940   HDL 53 02/02/2020 0940   CHOLHDL 5.7 (H) 02/02/2020 0940   CHOLHDL 4.2 09/10/2017 0959   VLDL 26 09/10/2017 0959   LDLCALC 222 (H) 02/02/2020 0940   Risk Assessment/Calculations:     The 10-year ASCVD risk score Mikey Bussing DC Jr., et al., 2013) is: 3.3%   Values used to calculate the score:     Age: 50 years     Sex: Female     Is  Non-Hispanic African American: Yes     Diabetic: No     Tobacco smoker: No     Systolic Blood Pressure: 299 mmHg     Is BP treated: Yes     HDL Cholesterol: 53 mg/dL     Total Cholesterol: 301 mg/dL   Physical Exam:    VS:  BP 116/80   Pulse 94   Ht 5\' 7"  (1.702 m)   Wt 172 lb (78 kg)   SpO2 98%   BMI 26.94 kg/m     Wt Readings from Last 3 Encounters:  04/24/20 172 lb (78 kg)  03/21/20 174 lb 3.2 oz (79 kg)  02/22/20 168 lb 12.8 oz (76.6 kg)    GEN:  Well nourished, well developed in no acute distress HEENT: Normal NECK: No JVD; No carotid bruits LYMPHATICS: No lymphadenopathy CARDIAC: RRR, no murmurs, rubs, gallops RESPIRATORY:  Clear to auscultation without rales, wheezing or rhonchi  ABDOMEN: Soft, non-tender, non-distended MUSCULOSKELETAL:  +1 ankle edema; No deformity, thick Achilles bilaterally without tenderness SKIN: Warm and dry NEUROLOGIC:  Alert and oriented x 3 PSYCHIATRIC:  Normal affect   ASSESSMENT:    No diagnosis found. PLAN:    In order of problems listed above:  Chest Pain Syndrome - Without epicardial disease - ASA, PRN Nitro - will get CPET; if  everything else has been ruled out; will consider either stress CMR or PET MPI for CFR  Hyperlipidemia (familial) -LDL goal less than 100 -continue current statin (atorvastatin 20 mg Po Daily) -Recheck lipid profile LFTs; based on results with increase statin; low threshold to start at lipid clinic - gave education on dietary changes  Essential Hypertension DOE - ambulatory blood pressure at not done, will restart ambulatory BP monitoring; gave education on how to perform ambulatory blood pressure monitoring including the frequency and technique; goal ambulatory blood pressure < 135/85 on average - continue home medications, but if patient notes increasing pressure or if BNP is elevated, will change HCTZ to CTZ with repeat labs in one week - discussed diet (DASH/low sodium), and exercise/weight loss interventions   Borderline Thoracic Aortic Dilation - will repeat echocardiogram in July 2022 to look for dilation  Palpitations - will obtain 14-day non live heart monitor (ZioPatch)  Summer-fall follow up unless new symptoms or abnormal test results warranting change in plan  Would be reasonable for  APP Follow up  Time Spent Directly with Patient:   I have spent a total of 40 minutes with the patient reviewing hospital notes, studies, and examining the patient as well as establishing an assessment and plan that was discussed personally with the patient.  > 50% of time was spent in direct patient care- discussed imaging with patient and husband; answering questions; reviewed images with patient.   Medication Adjustments/Labs and Tests Ordered: Current medicines are reviewed at length with the patient today.  Concerns regarding medicines are outlined above.  No orders of the defined types were placed in this encounter.  No orders of the defined types were placed in this encounter.   There are no Patient Instructions on file for this visit.   Signed, Werner Lean, MD   04/24/2020 3:43 PM    [facility]

## 2020-04-30 DIAGNOSIS — R002 Palpitations: Secondary | ICD-10-CM

## 2020-05-01 ENCOUNTER — Telehealth: Payer: Self-pay | Admitting: Clinical

## 2020-05-01 NOTE — Telephone Encounter (Signed)
Left HIPPA-compliant message to call back Roselyn Reef from [employer] for [employer] at [facility] at [phone] [facility] office). Informed pt I would be sending her a MyChart message.

## 2020-05-03 ENCOUNTER — Other Ambulatory Visit: Payer: 59 | Admitting: *Deleted

## 2020-05-03 ENCOUNTER — Other Ambulatory Visit: Payer: Self-pay

## 2020-05-03 DIAGNOSIS — R0609 Other forms of dyspnea: Secondary | ICD-10-CM

## 2020-05-03 DIAGNOSIS — R0600 Dyspnea, unspecified: Secondary | ICD-10-CM

## 2020-05-03 DIAGNOSIS — E78019 Familial hypercholesterolemia, unspecified: Secondary | ICD-10-CM

## 2020-05-03 DIAGNOSIS — E7801 Familial hypercholesterolemia: Secondary | ICD-10-CM

## 2020-05-04 LAB — HEPATIC FUNCTION PANEL
ALT: 69 IU/L — ABNORMAL HIGH (ref 0–32)
AST: 41 IU/L — ABNORMAL HIGH (ref 0–40)
Albumin: 4.5 g/dL (ref 3.8–4.8)
Alkaline Phosphatase: 92 IU/L (ref 44–121)
Bilirubin Total: 0.5 mg/dL (ref 0.0–1.2)
Bilirubin, Direct: 0.11 mg/dL (ref 0.00–0.40)
Total Protein: 7.6 g/dL (ref 6.0–8.5)

## 2020-05-04 LAB — LIPID PANEL
Chol/HDL Ratio: 4.5 ratio — ABNORMAL HIGH (ref 0.0–4.4)
Cholesterol, Total: 235 mg/dL — ABNORMAL HIGH (ref 100–199)
HDL: 52 mg/dL (ref >39)
LDL Chol Calc (NIH): 150 mg/dL — ABNORMAL HIGH (ref 0–99)
Triglycerides: 184 mg/dL — ABNORMAL HIGH (ref 0–149)
VLDL Cholesterol Cal: 33 mg/dL (ref 5–40)

## 2020-05-04 LAB — PRO B NATRIURETIC PEPTIDE: NT-Pro BNP: <5 pg/mL (ref 0–249)

## 2020-05-08 ENCOUNTER — Other Ambulatory Visit: Payer: Self-pay

## 2020-05-08 ENCOUNTER — Telehealth: Payer: Self-pay

## 2020-05-08 DIAGNOSIS — E785 Hyperlipidemia, unspecified: Secondary | ICD-10-CM

## 2020-05-08 NOTE — Progress Notes (Signed)
Note written to excuse her from driving children until all cardiac testing and care is complete.

## 2020-05-08 NOTE — Progress Notes (Signed)
Placed order for LFT due in 6-8 weeks.

## 2020-05-08 NOTE — Telephone Encounter (Signed)
-----   Message from Werner Lean, MD sent at 05/07/2020 12:05 PM EDT ----- Results: Mild increase in AST, LFTs without transaminitis Plan: Will send to lipid clinic Will repeat in 6-8 weeks   Werner Lean, MD

## 2020-05-08 NOTE — Telephone Encounter (Signed)
Placed an order for Lipid clinic referral per Dr. Gasper Sells recommendations.

## 2020-05-10 NOTE — Progress Notes (Signed)
Patient ID: Brandy [name]                 DOB: 11-01-1970                    MRN: [id number]     HPI: Brandy [name] is a 50 y.o. female patient referred to lipid clinic by Dr. Gasper Sells. PMH is significant for HTN, Familial Hypercholesterolemia, Carpal Tunnel Syndrome. Last visit with Dr. Gasper Sells 04/24/20 for follow up of chest pain. Cardiac CTA 02/2020 showed coronary calcium score of 0. LDL was 150 on atorvastatin 20 mg daily. Given slight AST/ALT elevation, continued current medication and referred to lipid clinic.    Today, patient arrives in good spirits. Denies family history of hyperlipidemia, heart disease, MI, stroke. No physical signs of familial hypercholesterolemia. She used to take atorvastatin 40 mg daily but reports her PCP discontinued this once her lipids had improved. Upon lab recheck 01/2020, LDL had increased and she was restarted on atorvastatin at 20 mg daily. Denies adverse effects or previous intolerances with atorvastatin. Denies excessive alcohol or Tylenol use prior to recent liver function tests. Reports making positive changes in her diet including reducing Dr. Malachi Bonds intake to once per day and eating more grilled food/salads.   Current Medications: atorvastatin 20 mg daily Previous Tried: atorvastatin 40 mg daily (tolerated well) Intolerances: None Risk Factors: HTN, baseline LDL > 190 LDL goal: <100 mg/dL   Diet: grilled meats, salads, rice, drinks 1 Dr. Malachi Bonds a day (down from 2-3 per day), coffee with sugar  Exercise: limited by shortness of breath  Family History: Diabetes in her father; Hyperlipidemia in her father and mother; Hypertension in her father and paternal grandfather  Social History: Never smoker  Labs: 05/03/20: TC 235, TG 184, HDL 52, LDL 150; AST 41, ALT 69 02/22/20: Lp(a) 43 02/02/20: TC 301, TG 140, HDL 53, LDL 222  Past Medical History:  Diagnosis Date  . Anxiety   . Arthritis   . Depression   . Disc  degeneration, lumbar 2011  . Hypertension   . IBS (irritable bowel syndrome)     Current Outpatient Medications on File Prior to Visit  Medication Sig Dispense Refill  . amLODipine (NORVASC) 2.5 MG tablet TAKE 1 TABLET (2.5 MG TOTAL) BY MOUTH DAILY. 30 tablet 0  . aspirin EC 81 MG tablet Take 1 tablet (81 mg total) by mouth daily. Swallow whole. 30 tablet 11  . atorvastatin (LIPITOR) 20 MG tablet Take 1 tablet (20 mg total) by mouth daily. 30 tablet 6  . cyclobenzaprine (FLEXERIL) 10 MG tablet Take 1 tablet (10 mg total) by mouth 2 (two) times daily as needed for muscle spasms. 60 tablet 2  . dicyclomine (BENTYL) 10 MG capsule Take 1 capsule (10 mg total) by mouth 3 (three) times daily before meals. 90 capsule 3  . hydrochlorothiazide (HYDRODIURIL) 25 MG tablet Take 1 tablet (25 mg total) by mouth daily. 30 tablet 6  . ibuprofen (ADVIL) 600 MG tablet Take 1 tablet (600 mg total) by mouth 2 (two) times daily as needed. 60 tablet 3  . losartan (COZAAR) 50 MG tablet Take 1 tablet (50 mg total) by mouth daily. 30 tablet 6  . nitroGLYCERIN (NITROSTAT) 0.4 MG SL tablet Place 1 tablet (0.4 mg total) under the tongue every 5 (five) minutes as needed for chest pain. 25 tablet 3  . omeprazole (PRILOSEC) 20 MG capsule Take 1 capsule (20 mg total) by mouth daily. 30 capsule 3  .  ondansetron (ZOFRAN ODT) 4 MG disintegrating tablet Take 1 tablet (4 mg total) by mouth every 8 (eight) hours as needed for nausea or vomiting. 20 tablet 0  . triamcinolone (NASACORT) 55 MCG/ACT AERO nasal inhaler Place 2 sprays into the nose daily. 1 Inhaler 0   No current facility-administered medications on file prior to visit.    No Known Allergies  Assessment/Plan:  1. Hyperlipidemia - Most recent LDL of 150 mg/dL is above goal <100 mg/dL. LDL was reduced by 32% by addition of atorvastatin 20 mg daily (moderate intensity statin), however has had mild increase in liver enzymes. Since the patient denies alcohol/Tylenol use  prior to lab work, suspect this is transient effect from atorvastatin. Rosuvastatin is less likely to impact LFTs. She also requires additional LDL lowering to reach her goal. Will switch atorvastatin 20 mg daily to rosuvastatin 20 mg daily, which is a high intensity statin and would expect to see greater reductions in LDL compared to atorvastatin 20 mg daily. However, do not expect this change to fully reduce LDL to goal. Will also add ezetimibe 10 mg daily. Recheck lipid panel and LFTs in 6 weeks. If LFTs normalize and LDL still not at goal despite adherence to new regimen, could consider increasing rosuvastatin to 40 mg daily if tolerated by patient.   Rebbeca Paul, PharmD PGY1 Pharmacy Resident 05/11/2020 11:47 AM

## 2020-05-11 ENCOUNTER — Other Ambulatory Visit: Payer: Self-pay | Admitting: Internal Medicine

## 2020-05-11 ENCOUNTER — Other Ambulatory Visit: Payer: Self-pay

## 2020-05-11 ENCOUNTER — Ambulatory Visit (INDEPENDENT_AMBULATORY_CARE_PROVIDER_SITE_OTHER): Payer: 59 | Admitting: Student-PharmD

## 2020-05-11 DIAGNOSIS — E785 Hyperlipidemia, unspecified: Secondary | ICD-10-CM

## 2020-05-11 MED ORDER — ROSUVASTATIN CALCIUM 20 MG PO TABS
20.0000 mg | ORAL_TABLET | Freq: Every day | ORAL | 11 refills | Status: DC
  Filled 2020-06-19: qty 30, 30d supply, fill #0
  Filled 2020-07-24: qty 30, 30d supply, fill #1
  Filled 2020-08-28: qty 30, 30d supply, fill #2
  Filled 2020-09-25: qty 30, 30d supply, fill #3
  Filled 2020-11-06: qty 30, 30d supply, fill #4
  Filled 2020-11-21 – 2020-11-27 (×3): qty 30, 30d supply, fill #5

## 2020-05-11 MED ORDER — EZETIMIBE 10 MG PO TABS
10.0000 mg | ORAL_TABLET | Freq: Every day | ORAL | 11 refills | Status: DC
  Filled 2020-06-19: qty 30, 30d supply, fill #0
  Filled 2020-07-24: qty 30, 30d supply, fill #1
  Filled 2020-08-28: qty 30, 30d supply, fill #2
  Filled 2020-09-25: qty 30, 30d supply, fill #3
  Filled 2020-11-01: qty 30, 30d supply, fill #4
  Filled 2020-11-27: qty 30, 30d supply, fill #5

## 2020-05-11 MED FILL — HYDROCHLOROTHIAZIDE 25 MG T: 25 | 30 days supply | Qty: 30 | Fill #3

## 2020-05-11 MED FILL — ROSUVASTATIN CALCIUM 20 MG: 20 | 30 days supply | Qty: 30 | Fill #0

## 2020-05-11 MED FILL — LOSARTAN POTASSIUM 50 MG TA: 50 | 30 days supply | Qty: 30 | Fill #3

## 2020-05-11 MED FILL — EZETIMIBE 10 MG TABS: 10 | 30 days supply | Qty: 30 | Fill #0

## 2020-05-11 NOTE — Patient Instructions (Signed)
Nice to see you today!  Keep up the good work with diet and exercise. Aim for a diet full of vegetables, fruit and lean meats (chicken, [place], fish). Try to limit carbs (bread, pasta, sugar, rice) and red meat consumption.  Your goal LDL is less than 100 mg/dL, you're currently at 150 mg/dL  Medication Changes: STOP taking atorvastatin  START taking rosuvastatin 20 mg daily and ezetimibe 10 mg daily  Follow up fasting lab work in 6 weeks: May 5th   Please give [place] a call at [phone] with any questions or concerns.

## 2020-05-22 ENCOUNTER — Ambulatory Visit (HOSPITAL_COMMUNITY): Payer: 59 | Attending: Internal Medicine

## 2020-05-22 ENCOUNTER — Other Ambulatory Visit: Payer: Self-pay

## 2020-05-22 ENCOUNTER — Telehealth (HOSPITAL_COMMUNITY): Payer: Self-pay

## 2020-05-22 DIAGNOSIS — E7801 Familial hypercholesterolemia: Secondary | ICD-10-CM | POA: Insufficient documentation

## 2020-05-22 DIAGNOSIS — R0609 Other forms of dyspnea: Secondary | ICD-10-CM

## 2020-05-22 DIAGNOSIS — I209 Angina pectoris, unspecified: Secondary | ICD-10-CM | POA: Diagnosis not present

## 2020-05-22 DIAGNOSIS — I10 Essential (primary) hypertension: Secondary | ICD-10-CM | POA: Insufficient documentation

## 2020-05-22 DIAGNOSIS — R0600 Dyspnea, unspecified: Secondary | ICD-10-CM | POA: Insufficient documentation

## 2020-05-22 NOTE — Telephone Encounter (Signed)
letter

## 2020-05-25 ENCOUNTER — Telehealth: Payer: Self-pay | Admitting: Clinical

## 2020-05-25 NOTE — Telephone Encounter (Signed)
Intern left HIPPA-compliant message to call back Roselyn Reef from [employer] for [employer] at [facility] at [phone] (main office) or [phone] ([place] office).   Gertha Calkin (Supervisor: Vesta Mixer)

## 2020-05-28 ENCOUNTER — Telehealth: Payer: Self-pay | Admitting: Internal Medicine

## 2020-05-28 NOTE — Telephone Encounter (Signed)
Called Patient to discuss results of CPET and Exercise RHC.  Patient and husband amenable to [place]; worried that this may also not give them an answer to SOB.    Risks and benefits of cardiac catheterization have been discussed with the patient.  These include bleeding, infection, kidney damage, stroke, heart attack, death.  The patient understands these risks and is willing to proceed.  Will plan for Exercise RHC with Dr. Jeffie Pollock.  Can you let me know when it is scheduled; I will then place the RHC order set.  Thanks,  Rudean Haskell, MD [address] [place], [place] [address] [phone]  4:41 PM

## 2020-06-02 ENCOUNTER — Telehealth: Payer: Self-pay

## 2020-06-02 NOTE — Telephone Encounter (Signed)
Called patient to inform her that exercise right heart cath could be done on 06/12/20.  She is agreeable to this date.  I also let her know that it has been past 30 days since last OV so I made an appointment for 06/07/20 with Dr. Gasper Sells.   Exercise right heart cath is scheduled for 06/12/20 at 11 am with Dr. Haroldine Laws.

## 2020-06-07 ENCOUNTER — Encounter: Payer: Self-pay | Admitting: Internal Medicine

## 2020-06-07 ENCOUNTER — Other Ambulatory Visit: Payer: Self-pay

## 2020-06-07 ENCOUNTER — Ambulatory Visit (INDEPENDENT_AMBULATORY_CARE_PROVIDER_SITE_OTHER): Payer: 59 | Admitting: Internal Medicine

## 2020-06-07 VITALS — BP 118/82 | HR 69 | Ht 67.0 in | Wt 172.0 lb

## 2020-06-07 DIAGNOSIS — R0600 Dyspnea, unspecified: Secondary | ICD-10-CM

## 2020-06-07 DIAGNOSIS — R0609 Other forms of dyspnea: Secondary | ICD-10-CM

## 2020-06-07 DIAGNOSIS — I10 Essential (primary) hypertension: Secondary | ICD-10-CM | POA: Diagnosis not present

## 2020-06-07 DIAGNOSIS — R002 Palpitations: Secondary | ICD-10-CM

## 2020-06-07 DIAGNOSIS — E7801 Familial hypercholesterolemia: Secondary | ICD-10-CM | POA: Diagnosis not present

## 2020-06-07 LAB — CBC
Hematocrit: 45.1 % (ref 34.0–46.6)
Hemoglobin: 15.2 g/dL (ref 11.1–15.9)
MCH: 29.8 pg (ref 26.6–33.0)
MCHC: 33.7 g/dL (ref 31.5–35.7)
MCV: 88 fL (ref 79–97)
Platelets: 264 10*3/uL (ref 150–450)
RBC: 5.1 x10E6/uL (ref 3.77–5.28)
RDW: 14.5 % (ref 11.7–15.4)
WBC: 5.8 10*3/uL (ref 3.4–10.8)

## 2020-06-07 LAB — BASIC METABOLIC PANEL
BUN/Creatinine Ratio: 16 (ref 9–23)
BUN: 14 mg/dL (ref 6–24)
CO2: 28 mmol/L (ref 20–29)
Calcium: 9.7 mg/dL (ref 8.7–10.2)
Chloride: 103 mmol/L (ref 96–106)
Creatinine, Ser: 0.86 mg/dL (ref 0.57–1.00)
Glucose: 92 mg/dL (ref 65–99)
Potassium: 3.8 mmol/L (ref 3.5–5.2)
Sodium: 141 mmol/L (ref 134–144)
eGFR: 83 mL/min/{1.73_m2} (ref >59)

## 2020-06-07 NOTE — Progress Notes (Addendum)
Cardiology Office Note:    Date:  06/07/2020   ID:  Brandy [name], DOB [birth year], MRN [id number]  PCP:  Charlott Rakes, MD  [facility] Cardiologist:  Rudean Haskell MD [place] Electrophysiologist:  None   CC: CPET and SOB follow up  History of Present Illness:    Brandy [name] is a 50 y.o. female with a hx of HTN, Familial Hypercholesterolemia, Carpal Tunnel Syndrome, who presents for evaluation 02/22/20.  In interim of this visit, patient negative CCTA.  Seen 04/24/20.  In interim of this visit, patient abnormal CPET.  Patient notes that she is doing about the same.  Since last visit notes no changes.  Relevant interval testing or therapy include transitioning to rosuvastation.  There are no interval hospital/ED visit.    No chest pain or pressure.  Still has DOE and bendopnyea.  With long period of talking. No weight gain or leg swelling outside of ankle.  No change in palpitations .   Past Medical History:  Diagnosis Date  . Anxiety   . Arthritis   . Depression   . Disc degeneration, lumbar 2011  . Hypertension   . IBS (irritable bowel syndrome)     Past Surgical History:  Procedure Laterality Date  . TUBAL LIGATION      Current Medications: Current Meds  Medication Sig  . amLODipine (NORVASC) 2.5 MG tablet TAKE 1 TABLET (2.5 MG TOTAL) BY MOUTH DAILY.  Marland Kitchen aspirin EC 81 MG tablet Take 1 tablet (81 mg total) by mouth daily. Swallow whole.  . cyclobenzaprine (FLEXERIL) 10 MG tablet Take 1 tablet (10 mg total) by mouth 2 (two) times daily as needed for muscle spasms.  Marland Kitchen dicyclomine (BENTYL) 10 MG capsule Take 1 capsule (10 mg total) by mouth 3 (three) times daily before meals.  Marland Kitchen ezetimibe (ZETIA) 10 MG tablet Take 1 tablet (10 mg total) by mouth daily.  . hydrochlorothiazide (HYDRODIURIL) 25 MG tablet Take 1 tablet (25 mg total) by mouth daily.  Marland Kitchen ibuprofen (ADVIL) 600 MG tablet Take 1 tablet (600 mg total) by mouth 2 (two) times daily as  needed.  Marland Kitchen losartan (COZAAR) 50 MG tablet Take 1 tablet (50 mg total) by mouth daily.  . metoprolol tartrate (LOPRESSOR) 100 MG tablet TAKE ONE TABLET BY MOUTH 2 HOURS PRIOR TO CARDIAC CT.  . nitroGLYCERIN (NITROSTAT) 0.4 MG SL tablet Place 1 tablet (0.4 mg total) under the tongue every 5 (five) minutes as needed for chest pain.  Marland Kitchen omeprazole (PRILOSEC) 20 MG capsule Take 1 capsule (20 mg total) by mouth daily.  . ondansetron (ZOFRAN ODT) 4 MG disintegrating tablet Take 1 tablet (4 mg total) by mouth every 8 (eight) hours as needed for nausea or vomiting.  . rosuvastatin (CRESTOR) 20 MG tablet Take 1 tablet (20 mg total) by mouth daily.  Marland Kitchen triamcinolone (NASACORT) 55 MCG/ACT AERO nasal inhaler Place 2 sprays into the nose daily.     Allergies:   Patient has no known allergies.   Social History   Socioeconomic History  . Marital status: Divorced    Spouse name: Not on file  . Number of children: 3  . Years of education: Not on file  . Highest education level: 12th grade  Occupational History  . Occupation: Chartered certified accountant  Tobacco Use  . Smoking status: Never Smoker  . Smokeless tobacco: Never Used  Vaping Use  . Vaping Use: Never used  Substance and Sexual Activity  . Alcohol use: Yes    Comment:  social  . Drug use: No  . Sexual activity: Yes    Partners: Male    Birth control/protection: Surgical    Comment: BTL  Other Topics Concern  . Not on file  Social History Narrative  . Not on file   Social Determinants of Health   Financial Resource Strain: Not on file  Food Insecurity: No Food Insecurity  . Worried About Charity fundraiser in the Last Year: Never true  . Ran Out of Food in the Last Year: Never true  Transportation Needs: No Transportation Needs  . Lack of Transportation (Medical): No  . Lack of Transportation (Non-Medical): No  Physical Activity: Not on file  Stress: Not on file  Social Connections: Not on file    Social:  Presents with her husband; her  brother in law his a CT Transplant Surgeon at [employer] History: The patient's family history includes Breast cancer (age of onset: 44) in her maternal aunt; Breast cancer (age of onset: 36) in her mother; Diabetes in her father; Hyperlipidemia in her father and mother; Hypertension in her father and paternal grandfather; Prostate cancer in her paternal grandfather; Thyroid disease in her father and mother. History of coronary artery disease notable for cousin. History of heart failure notable for no members. History of arrhythmia notable for no members. No history of bicuspid aortic valve or aortic aneurysm or dissection.  ROS:   Please see the history of present illness.     All other systems reviewed and are negative.  EKGs/Labs/Other Studies Reviewed:    The following studies were reviewed today:  EKG:   06/07/20: SR rate 69 1st HB PR 4/20 with occasional PAC 02/01/20: SR rate 87, LAE, borderline evidence for ant. infarct  Transthoracic Echocardiogram: Date:07/09/2016 Results: Left ventricle: The cavity size was normal. Systolic function was  normal. The estimated ejection fraction was in the range of 60%  to 65%. Wall motion was normal; there were no regional wall  motion abnormalities. Left ventricular diastolic function  parameters were normal.  - Aortic valve: Transvalvular velocity was within the normal range.  There was no stenosis. There was no regurgitation.  - Mitral valve: Transvalvular velocity was within the normal range.  There was no evidence for stenosis. There was no regurgitation.  - Right ventricle: The cavity size was normal. Wall thickness was  normal. Systolic function was normal.  - Atrial septum: No defect or patent foramen ovale was identified.  - Tricuspid valve: There was no regurgitation.   Cardiopulmonary Exercise Testing: Date: 05/23/18 Results: Exercise testing with gas exchange demonstrates moderate functional impairment  when compared to matched sedentary norms. Given several abnormal responses (reduced PVO2, chronotropic incompetence, poor OUES, and severely elevated VE/VCO2), this could indicate increased pulmonary pressures during exercise, and furthermore reflecting cardiovascular limitations with recommendation to clinically correlate.   CCTA:  Date:03/15/20 Results:  MPRESSION: 1. Coronary calcium score of 0. This was 1st percentile for age, sex, and race matched control.  2. Normal coronary origin with right dominance.  3. Ascending aorta borderline dilation: 38.5 mm with ascending aortic index 2.3.  4. Tortuous coronary arteries. CAD-RADS 0. No evidence of CAD (0%). Consider non-atherosclerotic causes of chest pain.  Recent Labs: 06/09/2019: Hemoglobin 15.1; Platelets 309.0 02/22/2020: BUN 11; Creatinine, Ser 0.86; Potassium 4.1; Sodium 143 05/03/2020: ALT 69; NT-Pro BNP <5  Recent Lipid Panel    Component Value Date/Time   CHOL 235 (H) 05/03/2020 0735   TRIG 184 (H) 05/03/2020 6314  HDL 52 05/03/2020 0735   CHOLHDL 4.5 (H) 05/03/2020 0735   CHOLHDL 4.2 09/10/2017 0959   VLDL 26 09/10/2017 0959   LDLCALC 150 (H) 05/03/2020 0735   Risk Assessment/Calculations:     The 10-year ASCVD risk score Mikey Bussing DC Brooke Bonito., et al., 2013) is: 2.9%   Values used to calculate the score:     Age: 45 years     Sex: Female     Is Non-Hispanic African American: Yes     Diabetic: No     Tobacco smoker: No     Systolic Blood Pressure: 093 mmHg     Is BP treated: Yes     HDL Cholesterol: 52 mg/dL     Total Cholesterol: 235 mg/dL   Physical Exam:    VS:  BP 118/82   Pulse 69   Ht 5\' 7"  (1.702 m)   Wt 172 lb (78 kg)   SpO2 98%   BMI 26.94 kg/m     Wt Readings from Last 3 Encounters:  06/07/20 172 lb (78 kg)  04/24/20 172 lb (78 kg)  03/21/20 174 lb 3.2 oz (79 kg)    GEN:  Well nourished, well developed in no acute distress HEENT: Normal NECK: No JVD; No carotid bruits LYMPHATICS: No  lymphadenopathy CARDIAC: RRR, no murmurs, rubs, gallops No RV heave RESPIRATORY:  Clear to auscultation without rales, wheezing or rhonchi  ABDOMEN: Soft, non-tender, non-distended MUSCULOSKELETAL:  +1 ankle edema; No deformity, tSKIN: Warm and dry NEUROLOGIC:  Alert and oriented x 3 PSYCHIATRIC:  Normal affect   ASSESSMENT:    1. Palpitations   2. DOE (dyspnea on exertion)   3. Essential hypertension   4. Familial hypercholesterolemia    PLAN:    In order of problems listed above:  Chest Pain Syndrome DOE Essential Hypertension Question of Exercise associated diastolic dysfunction - Atypical CPET:  Will get Exercise RHC with Dr. Jeffie Pollock - Risks and benefits of cardiac catheterization have been discussed with the patient.  These include bleeding, infection, kidney damage, stroke, heart attack, death.  The patient understands these risks and is willing to proceed. - if no other findings will get PFTs and possibly stress CMR or [facility] PET imaging  Hyperlipidemia (familial) -no CAC -LDL goal less than 70 - rosuvastatin and ezetimibe - seeing Lipid clinic  Borderline Thoracic Aortic Dilation - will repeat echocardiogram or Stress CMR for evaluation depend  Time Spent Directly with Patient:   I have spent a total of 40 minutes with the patient reviewing notes, imaging, EKGs, labs and examining the patient as well as establishing an assessment and plan that was discussed personally with the patient.  > 50% of time was spent in direct patient care talking with patient, her husband, and her brother in Sports coach Sales promotion account executive at [employer]). .   Medication Adjustments/Labs and Tests Ordered: Current medicines are reviewed at length with the patient today.  Concerns regarding medicines are outlined above.  Orders Placed This Encounter  Procedures  . CBC  . Basic metabolic panel   No orders of the defined types were placed in this encounter.   Patient Instructions  Medication  Instructions:  Your physician recommends that you continue on your current medications as directed. Please refer to the Current Medication list given to you today.  *If you need a refill on your cardiac medications before your next appointment, please call your pharmacy*   Lab Work: TODAY: CBC and BMET If you have labs (blood work) drawn today and  your tests are completely normal, you will receive your results only by: Marland Kitchen MyChart Message (if you have MyChart) OR . A paper copy in the mail If you have any lab test that is abnormal or we need to change your treatment, we will call you to review the results.   Testing/Procedures: Your physician has requested that you have a right cardiac catheterization. Cardiac catheterization is used to diagnose and/or treat various heart conditions. Doctors may recommend this procedure for a number of different reasons. The most common reason is to evaluate chest pain. Chest pain can be a symptom of coronary artery disease (CAD), and cardiac catheterization can show whether plaque is narrowing or blocking your heart's arteries. This procedure is also used to evaluate the valves, as well as measure the blood flow and oxygen levels in different parts of your heart.  Please follow instruction sheet, as given.        COVID TEST--____4/22/22 at 1:20PM_______-- You will go to [facility]. [place], [place] [address]  for your Covid testing.   This is a drive thru test site.   Be sure to share with the first checkpoint that you are there for pre-procedure/surgery testing. Stay in your car and the nurse team will come to your car to test you.  After you are tested please go home and self-quarantine until the day of your procedure on June 12, 2020.      Follow-Up: After cath At [facility], you and your health needs are our priority.  As part of our continuing mission to provide you with exceptional heart care, we have created designated Provider Care Teams.   These Care Teams include your primary Cardiologist (physician) and Advanced Practice Providers (APPs -  Physician Assistants and Nurse Practitioners) who all work together to provide you with the care you need, when you need it.     The format for your next appointment:   In Person  Provider:   You may see Rudean Haskell, MD or one of the following Advanced Practice Providers on your designated Care Team:    Melina Copa, PA-C  Ermalinda Barrios, PA-C    Other Instructions See heart cath instruction sheet     Signed, Werner Lean, MD  06/07/2020 10:39 AM    [place]

## 2020-06-07 NOTE — H&P (View-Only) (Signed)
Cardiology Office Note:    Date:  06/07/2020   ID:  Brandy [name], DOB [birth year], MRN [id number]  PCP:  Charlott Rakes, MD  [facility] Cardiologist:  Rudean Haskell MD [place] Electrophysiologist:  None   CC: CPET and SOB follow up  History of Present Illness:    Brandy [name] is a 50 y.o. female with a hx of HTN, Familial Hypercholesterolemia, Carpal Tunnel Syndrome, who presents for evaluation 02/22/20.  In interim of this visit, patient negative CCTA.  Seen 04/24/20.  In interim of this visit, patient abnormal CPET.  Patient notes that she is doing about the same.  Since last visit notes no changes.  Relevant interval testing or therapy include transitioning to rosuvastation.  There are no interval hospital/ED visit.    No chest pain or pressure.  Still has DOE and bendopnyea.  With long period of talking. No weight gain or leg swelling outside of ankle.  No change in palpitations .   Past Medical History:  Diagnosis Date  . Anxiety   . Arthritis   . Depression   . Disc degeneration, lumbar 2011  . Hypertension   . IBS (irritable bowel syndrome)     Past Surgical History:  Procedure Laterality Date  . TUBAL LIGATION      Current Medications: Current Meds  Medication Sig  . amLODipine (NORVASC) 2.5 MG tablet TAKE 1 TABLET (2.5 MG TOTAL) BY MOUTH DAILY.  Marland Kitchen aspirin EC 81 MG tablet Take 1 tablet (81 mg total) by mouth daily. Swallow whole.  . cyclobenzaprine (FLEXERIL) 10 MG tablet Take 1 tablet (10 mg total) by mouth 2 (two) times daily as needed for muscle spasms.  Marland Kitchen dicyclomine (BENTYL) 10 MG capsule Take 1 capsule (10 mg total) by mouth 3 (three) times daily before meals.  Marland Kitchen ezetimibe (ZETIA) 10 MG tablet Take 1 tablet (10 mg total) by mouth daily.  . hydrochlorothiazide (HYDRODIURIL) 25 MG tablet Take 1 tablet (25 mg total) by mouth daily.  Marland Kitchen ibuprofen (ADVIL) 600 MG tablet Take 1 tablet (600 mg total) by mouth 2 (two) times daily as  needed.  Marland Kitchen losartan (COZAAR) 50 MG tablet Take 1 tablet (50 mg total) by mouth daily.  . metoprolol tartrate (LOPRESSOR) 100 MG tablet TAKE ONE TABLET BY MOUTH 2 HOURS PRIOR TO CARDIAC CT.  . nitroGLYCERIN (NITROSTAT) 0.4 MG SL tablet Place 1 tablet (0.4 mg total) under the tongue every 5 (five) minutes as needed for chest pain.  Marland Kitchen omeprazole (PRILOSEC) 20 MG capsule Take 1 capsule (20 mg total) by mouth daily.  . ondansetron (ZOFRAN ODT) 4 MG disintegrating tablet Take 1 tablet (4 mg total) by mouth every 8 (eight) hours as needed for nausea or vomiting.  . rosuvastatin (CRESTOR) 20 MG tablet Take 1 tablet (20 mg total) by mouth daily.  Marland Kitchen triamcinolone (NASACORT) 55 MCG/ACT AERO nasal inhaler Place 2 sprays into the nose daily.     Allergies:   Patient has no known allergies.   Social History   Socioeconomic History  . Marital status: Divorced    Spouse name: Not on file  . Number of children: 3  . Years of education: Not on file  . Highest education level: 12th grade  Occupational History  . Occupation: Chartered certified accountant  Tobacco Use  . Smoking status: Never Smoker  . Smokeless tobacco: Never Used  Vaping Use  . Vaping Use: Never used  Substance and Sexual Activity  . Alcohol use: Yes    Comment:  social  . Drug use: No  . Sexual activity: Yes    Partners: Male    Birth control/protection: Surgical    Comment: BTL  Other Topics Concern  . Not on file  Social History Narrative  . Not on file   Social Determinants of Health   Financial Resource Strain: Not on file  Food Insecurity: No Food Insecurity  . Worried About Charity fundraiser in the Last Year: Never true  . Ran Out of Food in the Last Year: Never true  Transportation Needs: No Transportation Needs  . Lack of Transportation (Medical): No  . Lack of Transportation (Non-Medical): No  Physical Activity: Not on file  Stress: Not on file  Social Connections: Not on file    Social:  Presents with her husband; her  brother in law his a CT Transplant Surgeon at [employer] History: The patient's family history includes Breast cancer (age of onset: 34) in her maternal aunt; Breast cancer (age of onset: 64) in her mother; Diabetes in her father; Hyperlipidemia in her father and mother; Hypertension in her father and paternal grandfather; Prostate cancer in her paternal grandfather; Thyroid disease in her father and mother. History of coronary artery disease notable for cousin. History of heart failure notable for no members. History of arrhythmia notable for no members. No history of bicuspid aortic valve or aortic aneurysm or dissection.  ROS:   Please see the history of present illness.     All other systems reviewed and are negative.  EKGs/Labs/Other Studies Reviewed:    The following studies were reviewed today:  EKG:   06/07/20: SR rate 69 1st HB PR 4/20 with occasional PAC 02/01/20: SR rate 87, LAE, borderline evidence for ant. infarct  Transthoracic Echocardiogram: Date:07/09/2016 Results: Left ventricle: The cavity size was normal. Systolic function was  normal. The estimated ejection fraction was in the range of 60%  to 65%. Wall motion was normal; there were no regional wall  motion abnormalities. Left ventricular diastolic function  parameters were normal.  - Aortic valve: Transvalvular velocity was within the normal range.  There was no stenosis. There was no regurgitation.  - Mitral valve: Transvalvular velocity was within the normal range.  There was no evidence for stenosis. There was no regurgitation.  - Right ventricle: The cavity size was normal. Wall thickness was  normal. Systolic function was normal.  - Atrial septum: No defect or patent foramen ovale was identified.  - Tricuspid valve: There was no regurgitation.   Cardiopulmonary Exercise Testing: Date: 05/23/18 Results: Exercise testing with gas exchange demonstrates moderate functional impairment  when compared to matched sedentary norms. Given several abnormal responses (reduced PVO2, chronotropic incompetence, poor OUES, and severely elevated VE/VCO2), this could indicate increased pulmonary pressures during exercise, and furthermore reflecting cardiovascular limitations with recommendation to clinically correlate.   CCTA:  Date:03/15/20 Results:  MPRESSION: 1. Coronary calcium score of 0. This was 1st percentile for age, sex, and race matched control.  2. Normal coronary origin with right dominance.  3. Ascending aorta borderline dilation: 38.5 mm with ascending aortic index 2.3.  4. Tortuous coronary arteries. CAD-RADS 0. No evidence of CAD (0%). Consider non-atherosclerotic causes of chest pain.  Recent Labs: 06/09/2019: Hemoglobin 15.1; Platelets 309.0 02/22/2020: BUN 11; Creatinine, Ser 0.86; Potassium 4.1; Sodium 143 05/03/2020: ALT 69; NT-Pro BNP <5  Recent Lipid Panel    Component Value Date/Time   CHOL 235 (H) 05/03/2020 0735   TRIG 184 (H) 05/03/2020 4008  HDL 52 05/03/2020 0735   CHOLHDL 4.5 (H) 05/03/2020 0735   CHOLHDL 4.2 09/10/2017 0959   VLDL 26 09/10/2017 0959   LDLCALC 150 (H) 05/03/2020 0735   Risk Assessment/Calculations:     The 10-year ASCVD risk score Mikey Bussing DC Brooke Bonito., et al., 2013) is: 2.9%   Values used to calculate the score:     Age: 53 years     Sex: Female     Is Non-Hispanic African American: Yes     Diabetic: No     Tobacco smoker: No     Systolic Blood Pressure: 742 mmHg     Is BP treated: Yes     HDL Cholesterol: 52 mg/dL     Total Cholesterol: 235 mg/dL   Physical Exam:    VS:  BP 118/82   Pulse 69   Ht 5\' 7"  (1.702 m)   Wt 172 lb (78 kg)   SpO2 98%   BMI 26.94 kg/m     Wt Readings from Last 3 Encounters:  06/07/20 172 lb (78 kg)  04/24/20 172 lb (78 kg)  03/21/20 174 lb 3.2 oz (79 kg)    GEN:  Well nourished, well developed in no acute distress HEENT: Normal NECK: No JVD; No carotid bruits LYMPHATICS: No  lymphadenopathy CARDIAC: RRR, no murmurs, rubs, gallops No RV heave RESPIRATORY:  Clear to auscultation without rales, wheezing or rhonchi  ABDOMEN: Soft, non-tender, non-distended MUSCULOSKELETAL:  +1 ankle edema; No deformity, tSKIN: Warm and dry NEUROLOGIC:  Alert and oriented x 3 PSYCHIATRIC:  Normal affect   ASSESSMENT:    1. Palpitations   2. DOE (dyspnea on exertion)   3. Essential hypertension   4. Familial hypercholesterolemia    PLAN:    In order of problems listed above:  Chest Pain Syndrome DOE Essential Hypertension Question of Exercise associated diastolic dysfunction - Atypical CPET:  Will get Exercise RHC with Dr. Jeffie Pollock - Risks and benefits of cardiac catheterization have been discussed with the patient.  These include bleeding, infection, kidney damage, stroke, heart attack, death.  The patient understands these risks and is willing to proceed. - if no other findings will get PFTs and possibly stress CMR or [facility] PET imaging  Hyperlipidemia (familial) -no CAC -LDL goal less than 70 - rosuvastatin and ezetimibe - seeing Lipid clinic  Borderline Thoracic Aortic Dilation - will repeat echocardiogram or Stress CMR for evaluation depend  Time Spent Directly with Patient:   I have spent a total of 40 minutes with the patient reviewing notes, imaging, EKGs, labs and examining the patient as well as establishing an assessment and plan that was discussed personally with the patient.  > 50% of time was spent in direct patient care talking with patient, her husband, and her brother in Sports coach Sales promotion account executive at [employer]). .   Medication Adjustments/Labs and Tests Ordered: Current medicines are reviewed at length with the patient today.  Concerns regarding medicines are outlined above.  Orders Placed This Encounter  Procedures  . CBC  . Basic metabolic panel   No orders of the defined types were placed in this encounter.   Patient Instructions  Medication  Instructions:  Your physician recommends that you continue on your current medications as directed. Please refer to the Current Medication list given to you today.  *If you need a refill on your cardiac medications before your next appointment, please call your pharmacy*   Lab Work: TODAY: CBC and BMET If you have labs (blood work) drawn today and  your tests are completely normal, you will receive your results only by: Marland Kitchen MyChart Message (if you have MyChart) OR . A paper copy in the mail If you have any lab test that is abnormal or we need to change your treatment, we will call you to review the results.   Testing/Procedures: Your physician has requested that you have a right cardiac catheterization. Cardiac catheterization is used to diagnose and/or treat various heart conditions. Doctors may recommend this procedure for a number of different reasons. The most common reason is to evaluate chest pain. Chest pain can be a symptom of coronary artery disease (CAD), and cardiac catheterization can show whether plaque is narrowing or blocking your heart's arteries. This procedure is also used to evaluate the valves, as well as measure the blood flow and oxygen levels in different parts of your heart.  Please follow instruction sheet, as given.        COVID TEST--____4/22/22 at 1:20PM_______-- You will go to [address] [place], [place] [address]  for your Covid testing.   This is a drive thru test site.   Be sure to share with the first checkpoint that you are there for pre-procedure/surgery testing. Stay in your car and the nurse team will come to your car to test you.  After you are tested please go home and self-quarantine until the day of your procedure on June 12, 2020.      Follow-Up: After cath At [facility], you and your health needs are our priority.  As part of our continuing mission to provide you with exceptional heart care, we have created designated Provider Care Teams.   These Care Teams include your primary Cardiologist (physician) and Advanced Practice Providers (APPs -  Physician Assistants and Nurse Practitioners) who all work together to provide you with the care you need, when you need it.     The format for your next appointment:   In Person  Provider:   You may see Rudean Haskell, MD or one of the following Advanced Practice Providers on your designated Care Team:    Melina Copa, PA-C  Ermalinda Barrios, PA-C    Other Instructions See heart cath instruction sheet     Signed, Werner Lean, MD  06/07/2020 10:39 AM    [place]

## 2020-06-07 NOTE — Patient Instructions (Addendum)
Medication Instructions:  Your physician recommends that you continue on your current medications as directed. Please refer to the Current Medication list given to you today.  *If you need a refill on your cardiac medications before your next appointment, please call your pharmacy*   Lab Work: TODAY: CBC and BMET If you have labs (blood work) drawn today and your tests are completely normal, you will receive your results only by: Marland Kitchen MyChart Message (if you have MyChart) OR . A paper copy in the mail If you have any lab test that is abnormal or we need to change your treatment, we will call you to review the results.   Testing/Procedures: Your physician has requested that you have a right cardiac catheterization. Cardiac catheterization is used to diagnose and/or treat various heart conditions. Doctors may recommend this procedure for a number of different reasons. The most common reason is to evaluate chest pain. Chest pain can be a symptom of coronary artery disease (CAD), and cardiac catheterization can show whether plaque is narrowing or blocking your heart's arteries. This procedure is also used to evaluate the valves, as well as measure the blood flow and oxygen levels in different parts of your heart.  Please follow instruction sheet, as given.        COVID TEST--____4/22/22 at 1:20PM_______-- You will go to [address]. [place], [place] [address]  for your Covid testing.   This is a drive thru test site.   Be sure to share with the first checkpoint that you are there for pre-procedure/surgery testing. Stay in your car and the nurse team will come to your car to test you.  After you are tested please go home and self-quarantine until the day of your procedure on June 12, 2020.      Follow-Up: After cath At [facility], you and your health needs are our priority.  As part of our continuing mission to provide you with exceptional heart care, we have created designated Provider Care  Teams.  These Care Teams include your primary Cardiologist (physician) and Advanced Practice Providers (APPs -  Physician Assistants and Nurse Practitioners) who all work together to provide you with the care you need, when you need it.     The format for your next appointment:   In Person  Provider:   You may see Rudean Haskell, MD or one of the following Advanced Practice Providers on your designated Care Team:    Melina Copa, PA-C  Ermalinda Barrios, PA-C    Other Instructions See heart cath instruction sheet

## 2020-06-08 ENCOUNTER — Telehealth: Payer: Self-pay

## 2020-06-08 NOTE — Telephone Encounter (Signed)
LMTCB  Exercise RHC with Dr. Haroldine Laws   Pt contacted pre-catheterization scheduled at [facility] for: 06/12/20 Verified arrival time and place: [facility]) at: 9:00 am.    No solid food after midnight prior to cath, clear liquids until 5 AM day of procedure.  AM meds can be  taken pre-cath with sips of water including: ASA 81 mg  Confirmed patient has responsible adult to drive home post procedure and be with patient first 24 hours after arriving home:  You are allowed ONE visitor in the waiting room during the time you are at the hospital for your procedure. Both you and your visitor must wear a mask once you enter the hospital.

## 2020-06-08 NOTE — Addendum Note (Signed)
Addended by: Rudean Haskell A on: 06/08/2020 01:14 PM   Modules accepted: Orders, SmartSet

## 2020-06-09 ENCOUNTER — Other Ambulatory Visit (HOSPITAL_COMMUNITY)
Admission: RE | Admit: 2020-06-09 | Discharge: 2020-06-09 | Disposition: A | Discharge status: Home / Self Care | Payer: 59 | Source: Ambulatory Visit | Attending: Internal Medicine | Admitting: Internal Medicine

## 2020-06-09 DIAGNOSIS — Z20822 Contact with and (suspected) exposure to covid-19: Secondary | ICD-10-CM | POA: Insufficient documentation

## 2020-06-09 DIAGNOSIS — Z01812 Encounter for preprocedural laboratory examination: Secondary | ICD-10-CM | POA: Insufficient documentation

## 2020-06-10 LAB — SARS CORONAVIRUS 2 (TAT 6-24 HRS): SARS Coronavirus 2: NEGATIVE

## 2020-06-12 ENCOUNTER — Encounter (HOSPITAL_COMMUNITY)
Admission: RE | Disposition: A | Discharge status: Home / Self Care | Payer: Self-pay | Source: Home / Self Care | Attending: Internal Medicine

## 2020-06-12 ENCOUNTER — Ambulatory Visit (HOSPITAL_COMMUNITY)
Admission: RE | Admit: 2020-06-12 | Discharge: 2020-06-12 | Disposition: A | Discharge status: Home / Self Care | Payer: 59 | Attending: Internal Medicine | Admitting: Internal Medicine

## 2020-06-12 ENCOUNTER — Other Ambulatory Visit: Payer: Self-pay

## 2020-06-12 DIAGNOSIS — R0609 Other forms of dyspnea: Secondary | ICD-10-CM | POA: Insufficient documentation

## 2020-06-12 DIAGNOSIS — E785 Hyperlipidemia, unspecified: Secondary | ICD-10-CM | POA: Diagnosis not present

## 2020-06-12 DIAGNOSIS — Z7982 Long term (current) use of aspirin: Secondary | ICD-10-CM | POA: Diagnosis not present

## 2020-06-12 DIAGNOSIS — I10 Essential (primary) hypertension: Secondary | ICD-10-CM | POA: Insufficient documentation

## 2020-06-12 DIAGNOSIS — Z79899 Other long term (current) drug therapy: Secondary | ICD-10-CM | POA: Diagnosis not present

## 2020-06-12 DIAGNOSIS — R002 Palpitations: Secondary | ICD-10-CM | POA: Diagnosis not present

## 2020-06-12 DIAGNOSIS — E7801 Familial hypercholesterolemia: Secondary | ICD-10-CM | POA: Insufficient documentation

## 2020-06-12 DIAGNOSIS — R079 Chest pain, unspecified: Secondary | ICD-10-CM | POA: Insufficient documentation

## 2020-06-12 DIAGNOSIS — R0600 Dyspnea, unspecified: Secondary | ICD-10-CM | POA: Diagnosis not present

## 2020-06-12 HISTORY — PX: RIGHT HEART CATH: CATH118263

## 2020-06-12 LAB — POCT I-STAT EG7
Acid-Base Excess: 0 mmol/L (ref 0.0–2.0)
Acid-Base Excess: 1 mmol/L (ref 0.0–2.0)
Acid-Base Excess: 1 mmol/L (ref 0.0–2.0)
Acid-Base Excess: 1 mmol/L (ref 0.0–2.0)
Acid-Base Excess: 2 mmol/L (ref 0.0–2.0)
Bicarbonate: 25 mmol/L (ref 20.0–28.0)
Bicarbonate: 25.1 mmol/L (ref 20.0–28.0)
Bicarbonate: 26.3 mmol/L (ref 20.0–28.0)
Bicarbonate: 26.8 mmol/L (ref 20.0–28.0)
Bicarbonate: 27.9 mmol/L (ref 20.0–28.0)
Calcium, Ion: 1.07 mmol/L — ABNORMAL LOW (ref 1.15–1.40)
Calcium, Ion: 1.12 mmol/L — ABNORMAL LOW (ref 1.15–1.40)
Calcium, Ion: 1.17 mmol/L (ref 1.15–1.40)
Calcium, Ion: 1.19 mmol/L (ref 1.15–1.40)
Calcium, Ion: 1.2 mmol/L (ref 1.15–1.40)
HCT: 41 % (ref 36.0–46.0)
HCT: 42 % (ref 36.0–46.0)
HCT: 42 % (ref 36.0–46.0)
HCT: 43 % (ref 36.0–46.0)
HCT: 43 % (ref 36.0–46.0)
Hemoglobin: 13.9 g/dL (ref 12.0–15.0)
Hemoglobin: 14.3 g/dL (ref 12.0–15.0)
Hemoglobin: 14.3 g/dL (ref 12.0–15.0)
Hemoglobin: 14.6 g/dL (ref 12.0–15.0)
Hemoglobin: 14.6 g/dL (ref 12.0–15.0)
O2 Saturation: 77 %
O2 Saturation: 80 %
O2 Saturation: 81 %
O2 Saturation: 81 %
O2 Saturation: 82 %
Potassium: 3.3 mmol/L — ABNORMAL LOW (ref 3.5–5.1)
Potassium: 3.5 mmol/L (ref 3.5–5.1)
Potassium: 3.6 mmol/L (ref 3.5–5.1)
Potassium: 3.7 mmol/L (ref 3.5–5.1)
Potassium: 3.7 mmol/L (ref 3.5–5.1)
Sodium: 144 mmol/L (ref 135–145)
Sodium: 145 mmol/L (ref 135–145)
Sodium: 145 mmol/L (ref 135–145)
Sodium: 145 mmol/L (ref 135–145)
Sodium: 147 mmol/L — ABNORMAL HIGH (ref 135–145)
TCO2: 26 mmol/L (ref 22–32)
TCO2: 26 mmol/L (ref 22–32)
TCO2: 28 mmol/L (ref 22–32)
TCO2: 28 mmol/L (ref 22–32)
TCO2: 29 mmol/L (ref 22–32)
pCO2, Ven: 38.8 mmHg — ABNORMAL LOW (ref 44.0–60.0)
pCO2, Ven: 41.2 mmHg — ABNORMAL LOW (ref 44.0–60.0)
pCO2, Ven: 44.4 mmHg (ref 44.0–60.0)
pCO2, Ven: 45.7 mmHg (ref 44.0–60.0)
pCO2, Ven: 46.1 mmHg (ref 44.0–60.0)
pH, Ven: 7.376 (ref 7.250–7.430)
pH, Ven: 7.381 (ref 7.250–7.430)
pH, Ven: 7.39 (ref 7.250–7.430)
pH, Ven: 7.392 (ref 7.250–7.430)
pH, Ven: 7.418 (ref 7.250–7.430)
pO2, Ven: 43 mmHg (ref 32.0–45.0)
pO2, Ven: 45 mmHg (ref 32.0–45.0)
pO2, Ven: 45 mmHg (ref 32.0–45.0)
pO2, Ven: 46 mmHg — ABNORMAL HIGH (ref 32.0–45.0)
pO2, Ven: 47 mmHg — ABNORMAL HIGH (ref 32.0–45.0)

## 2020-06-12 SURGERY — RIGHT HEART CATH
Anesthesia: LOCAL

## 2020-06-12 MED ORDER — SODIUM CHLORIDE 0.9% FLUSH: 3.0000 mL | INTRAVENOUS | Status: DC | PRN

## 2020-06-12 MED ORDER — HEPARIN (PORCINE) IN NACL 1000-0.9 UT/500ML-% IV SOLN
INTRAVENOUS | Status: AC
  Filled 2020-06-12: qty 500

## 2020-06-12 MED ORDER — SODIUM CHLORIDE 0.9 % IV SOLN: 250.0000 mL | INTRAVENOUS | Status: DC | PRN

## 2020-06-12 MED ORDER — LABETALOL HCL 5 MG/ML IV SOLN: 10.0000 mg | INTRAVENOUS | Status: DC | PRN

## 2020-06-12 MED ORDER — ASPIRIN 81 MG PO CHEW: 81.0000 mg | CHEWABLE_TABLET | ORAL | Status: DC

## 2020-06-12 MED ORDER — LIDOCAINE HCL (PF) 1 % IJ SOLN
INTRAMUSCULAR | Status: DC | PRN
  Administered 2020-06-12: 2 mL

## 2020-06-12 MED ORDER — HYDRALAZINE HCL 20 MG/ML IJ SOLN
10.0000 mg | INTRAMUSCULAR | Status: DC | PRN
Start: 2020-06-12 — End: 2020-06-12

## 2020-06-12 MED ORDER — SODIUM CHLORIDE 0.9 % IV SOLN: INTRAVENOUS | Status: DC

## 2020-06-12 MED ORDER — SODIUM CHLORIDE 0.9% FLUSH: 3.0000 mL | Freq: Two times a day (BID) | INTRAVENOUS | Status: DC

## 2020-06-12 MED ORDER — HEPARIN (PORCINE) IN NACL 1000-0.9 UT/500ML-% IV SOLN
INTRAVENOUS | Status: DC | PRN
  Administered 2020-06-12: 500 mL

## 2020-06-12 MED ORDER — ACETAMINOPHEN 325 MG PO TABS: 650.0000 mg | ORAL_TABLET | ORAL | Status: DC | PRN

## 2020-06-12 MED ORDER — LIDOCAINE HCL (PF) 1 % IJ SOLN
INTRAMUSCULAR | Status: AC
  Filled 2020-06-12: qty 30

## 2020-06-12 MED ORDER — ONDANSETRON HCL 4 MG/2ML IJ SOLN: 4.0000 mg | Freq: Four times a day (QID) | INTRAMUSCULAR | Status: DC | PRN

## 2020-06-12 SURGICAL SUPPLY — 6 items
CATH SWAN GANZ 7F STRAIGHT (CATHETERS) ×1 IMPLANT
GLIDESHEATH SLENDER 7FR .021G (SHEATH) ×1 IMPLANT
PACK CARDIAC CATHETERIZATION (CUSTOM PROCEDURE TRAY) ×2 IMPLANT
TRANSDUCER W/STOPCOCK (MISCELLANEOUS) ×2 IMPLANT
TUBING ART PRESS 72  MALE/FEM (TUBING) ×2
TUBING ART PRESS 72 MALE/FEM (TUBING) IMPLANT

## 2020-06-12 NOTE — Addendum Note (Signed)
Addended by: Precious Gilding on: 06/12/2020 10:11 AM   Modules accepted: Orders

## 2020-06-12 NOTE — Interval H&P Note (Signed)
History and Physical Interval Note:  06/12/2020 12:02 PM  Brandy [name]  has presented today for surgery, with the diagnosis of chf.  The various methods of treatment have been discussed with the patient and family. After consideration of risks, benefits and other options for treatment, the patient has consented to  Procedure(s): RIGHT HEART CATH (N/A) with exercise as a surgical intervention.  The patient's history has been reviewed, patient examined, no change in status, stable for surgery.  I have reviewed the patient's chart and labs.  Questions were answered to the patient's satisfaction.     [name]

## 2020-06-13 ENCOUNTER — Encounter (HOSPITAL_COMMUNITY): Payer: Self-pay | Admitting: Internal Medicine

## 2020-06-15 ENCOUNTER — Other Ambulatory Visit: Payer: Self-pay | Admitting: Family Medicine

## 2020-06-15 DIAGNOSIS — I10 Essential (primary) hypertension: Secondary | ICD-10-CM

## 2020-06-15 MED ORDER — HYDROCHLOROTHIAZIDE 25 MG PO TABS
25.0000 mg | ORAL_TABLET | Freq: Every day | ORAL | 3 refills | Status: DC
  Filled 2020-06-15: qty 30, 30d supply, fill #0
  Filled 2020-07-24: qty 30, 30d supply, fill #1
  Filled 2020-08-28: qty 30, 30d supply, fill #2
  Filled 2020-10-18: qty 30, 30d supply, fill #3

## 2020-06-15 MED ORDER — LOSARTAN POTASSIUM 50 MG PO TABS
50.0000 mg | ORAL_TABLET | Freq: Every day | ORAL | 3 refills | Status: DC
  Filled 2020-06-15: qty 30, 30d supply, fill #0
  Filled 2020-07-24: qty 30, 30d supply, fill #1
  Filled 2020-08-28: qty 30, 30d supply, fill #2
  Filled 2020-11-01: qty 30, 30d supply, fill #3

## 2020-06-15 NOTE — Telephone Encounter (Signed)
Requested medication (s) are due for refill today: yes  Requested medication (s) are on the active medication list: yes   Last refill:  05/11/20-08/09/20 #30 11 refills   Future visit scheduled: no  Notes to clinic:  meds ordered by Rudean Haskell, MD. Do you want to refill Rx?     Requested Prescriptions  Pending Prescriptions Disp Refills   rosuvastatin (CRESTOR) 20 MG tablet 30 tablet 11    Sig: Take 1 tablet (20 mg total) by mouth daily.      Cardiovascular:  Antilipid - Statins Failed - 06/15/2020  5:24 PM      Failed - Total Cholesterol in normal range and within 360 days    Cholesterol, Total  Date Value Ref Range Status  05/03/2020 235 (H) 100 - 199 mg/dL Final          Failed - LDL in normal range and within 360 days    LDL Chol Calc (NIH)  Date Value Ref Range Status  05/03/2020 150 (H) 0 - 99 mg/dL Final          Failed - Triglycerides in normal range and within 360 days    Triglycerides  Date Value Ref Range Status  05/03/2020 184 (H) 0 - 149 mg/dL Final          Passed - HDL in normal range and within 360 days    HDL  Date Value Ref Range Status  05/03/2020 52 >39 mg/dL Final          Passed - Patient is not pregnant      Passed - Valid encounter within last 12 months    Recent Outpatient Visits           3 months ago Angina pectoris ([place])   [place], [place], MD   4 months ago Nausea and vomiting, intractability of vomiting not specified, unspecified vomiting type   [place], [place], MD   1 year ago Irritable bowel syndrome with diarrhea   [place], [place], [place]   1 year ago Sciatica of right side   [place], [place], MD   1 year ago Sciatica of right side   [place], [place], MD                  ezetimibe (ZETIA) 10 MG tablet 30  tablet 11    Sig: Take 1 tablet (10 mg total) by mouth daily.      Cardiovascular:  Antilipid - Sterol Transport Inhibitors Failed - 06/15/2020  5:24 PM      Failed - Total Cholesterol in normal range and within 360 days    Cholesterol, Total  Date Value Ref Range Status  05/03/2020 235 (H) 100 - 199 mg/dL Final          Failed - LDL in normal range and within 360 days    LDL Chol Calc (NIH)  Date Value Ref Range Status  05/03/2020 150 (H) 0 - 99 mg/dL Final          Failed - Triglycerides in normal range and within 360 days    Triglycerides  Date Value Ref Range Status  05/03/2020 184 (H) 0 - 149 mg/dL Final          Passed - HDL in normal range and within 360 days    HDL  Date Value  Ref Range Status  05/03/2020 52 >39 mg/dL Final          Passed - Valid encounter within last 12 months    Recent Outpatient Visits           3 months ago Angina pectoris [facility])   [place], [place], MD   4 months ago Nausea and vomiting, intractability of vomiting not specified, unspecified vomiting type   [place], [place], MD   1 year ago Irritable bowel syndrome with diarrhea   [place], [place]   1 year ago Sciatica of right side   [place], [place], MD   1 year ago Sciatica of right side   [place], Charlane Ferretti, MD                 Signed Prescriptions Disp Refills   losartan (COZAAR) 50 MG tablet 30 tablet 3    Sig: Take 1 tablet (50 mg total) by mouth daily.      Cardiovascular:  Angiotensin Receptor Blockers Passed - 06/15/2020  5:24 PM      Passed - Cr in normal range and within 180 days    Creatinine, Ser  Date Value Ref Range Status  06/07/2020 0.86 0.57 - 1.00 mg/dL Final          Passed - K in normal range and within 180 days    Potassium  Date Value Ref Range  Status  06/12/2020 3.7 3.5 - 5.1 mmol/L Final          Passed - Patient is not pregnant      Passed - Last BP in normal range    BP Readings from Last 1 Encounters:  06/12/20 115/88          Passed - Valid encounter within last 6 months    Recent Outpatient Visits           3 months ago Angina pectoris [facility])   [place], [place], MD   4 months ago Nausea and vomiting, intractability of vomiting not specified, unspecified vomiting type   [place], [place], MD   1 year ago Irritable bowel syndrome with diarrhea   [place], [place], [place]   1 year ago Sciatica of right side   [place], [place], MD   1 year ago Sciatica of right side   [place], [place], MD                  hydrochlorothiazide (HYDRODIURIL) 25 MG tablet 30 tablet 3    Sig: Take 1 tablet (25 mg total) by mouth daily.      Cardiovascular: Diuretics - Thiazide Passed - 06/15/2020  5:24 PM      Passed - Ca in normal range and within 360 days    Calcium  Date Value Ref Range Status  06/07/2020 9.7 8.7 - 10.2 mg/dL Final   Calcium, Ion  Date Value Ref Range Status  06/12/2020 1.19 1.15 - 1.40 mmol/L Final          Passed - Cr in normal range and within 360 days    Creatinine, Ser  Date Value Ref Range Status  06/07/2020 0.86 0.57 - 1.00 mg/dL Final  Passed - K in normal range and within 360 days    Potassium  Date Value Ref Range Status  06/12/2020 3.7 3.5 - 5.1 mmol/L Final          Passed - Na in normal range and within 360 days    Sodium  Date Value Ref Range Status  06/12/2020 145 135 - 145 mmol/L Final  06/07/2020 141 134 - 144 mmol/L Final          Passed - Last BP in normal range    BP Readings from Last 1 Encounters:  06/12/20 115/88          Passed - Valid encounter  within last 6 months    Recent Outpatient Visits           3 months ago Angina pectoris [facility])   [place], [place], MD   4 months ago Nausea and vomiting, intractability of vomiting not specified, unspecified vomiting type   [place], Enobong, MD   1 year ago Irritable bowel syndrome with diarrhea   [place], [place]   1 year ago Sciatica of right side   [place], Charlane Ferretti, MD   1 year ago Sciatica of right side   [facility] Charlott Rakes, MD

## 2020-06-15 NOTE — Telephone Encounter (Signed)
No future visit

## 2020-06-15 NOTE — Telephone Encounter (Signed)
Copied from [place] [id number]. Topic: Quick Communication - Rx Refill/Question >> Jun 15, 2020  4:56 PM Lenon Curt, [name] wrote: Medication: losartan (COZAAR) 50 MG tablet  hydrochlorothiazide (HYDRODIURIL) 25 MG tablet  rosuvastatin (CRESTOR) 20 MG tablet  ezetimibe (ZETIA) 10 MG tablet   Has the patient contacted their pharmacy? Yes. Patient has attempted to contact their pharmacy via phone and been unsuccessful.   Preferred Pharmacy (with phone number or street name): [facility]  Phone:  [phone] Fax:  [phone]  Agent: Please be advised that RX refills may take up to 3 business days. We ask that you follow-up with your pharmacy.

## 2020-06-16 ENCOUNTER — Other Ambulatory Visit: Payer: Self-pay

## 2020-06-19 ENCOUNTER — Other Ambulatory Visit: Payer: Self-pay

## 2020-06-20 ENCOUNTER — Other Ambulatory Visit: Payer: Self-pay

## 2020-06-21 ENCOUNTER — Telehealth: Payer: Self-pay | Admitting: Family Medicine

## 2020-06-21 NOTE — Telephone Encounter (Signed)
Patient called to ask the nurse to call her regarding getting a suggestion from the doctor for sleep meds. And pre menopause.  Patient stated she had spoke to the doctor before regarding this but the appts. Are so far out right now.  Please advise and call to discuss at [phone]

## 2020-06-22 ENCOUNTER — Other Ambulatory Visit: Payer: Self-pay

## 2020-06-22 ENCOUNTER — Encounter: Payer: Self-pay | Admitting: Family Medicine

## 2020-06-22 ENCOUNTER — Other Ambulatory Visit: Payer: 59

## 2020-06-22 ENCOUNTER — Ambulatory Visit: Payer: 59 | Attending: Family Medicine | Admitting: Family Medicine

## 2020-06-22 VITALS — BP 107/75 | HR 82 | Ht 67.0 in | Wt 168.0 lb

## 2020-06-22 DIAGNOSIS — K219 Gastro-esophageal reflux disease without esophagitis: Secondary | ICD-10-CM

## 2020-06-22 DIAGNOSIS — R0609 Other forms of dyspnea: Secondary | ICD-10-CM

## 2020-06-22 DIAGNOSIS — F32A Depression, unspecified: Secondary | ICD-10-CM | POA: Diagnosis not present

## 2020-06-22 DIAGNOSIS — E785 Hyperlipidemia, unspecified: Secondary | ICD-10-CM

## 2020-06-22 DIAGNOSIS — F419 Anxiety disorder, unspecified: Secondary | ICD-10-CM

## 2020-06-22 DIAGNOSIS — N951 Menopausal and female climacteric states: Secondary | ICD-10-CM

## 2020-06-22 DIAGNOSIS — G4709 Other insomnia: Secondary | ICD-10-CM

## 2020-06-22 DIAGNOSIS — I10 Essential (primary) hypertension: Secondary | ICD-10-CM

## 2020-06-22 LAB — LIPID PANEL
Chol/HDL Ratio: 3 ratio (ref 0.0–4.4)
Cholesterol, Total: 127 mg/dL (ref 100–199)
HDL: 43 mg/dL (ref >39)
LDL Chol Calc (NIH): 62 mg/dL (ref 0–99)
Triglycerides: 123 mg/dL (ref 0–149)
VLDL Cholesterol Cal: 22 mg/dL (ref 5–40)

## 2020-06-22 LAB — HEPATIC FUNCTION PANEL
ALT: 85 IU/L — ABNORMAL HIGH (ref 0–32)
AST: 30 IU/L (ref 0–40)
Albumin: 4.6 g/dL (ref 3.8–4.8)
Alkaline Phosphatase: 92 IU/L (ref 44–121)
Bilirubin Total: 0.4 mg/dL (ref 0.0–1.2)
Bilirubin, Direct: 0.13 mg/dL (ref 0.00–0.40)
Total Protein: 7.3 g/dL (ref 6.0–8.5)

## 2020-06-22 MED ORDER — VENLAFAXINE HCL ER 75 MG PO CP24
75.0000 mg | ORAL_CAPSULE | Freq: Every day | ORAL | 3 refills | Status: DC
  Filled 2020-06-22: qty 30, 30d supply, fill #0

## 2020-06-22 MED ORDER — HYDROXYZINE HCL 25 MG PO TABS
25.0000 mg | ORAL_TABLET | Freq: Every evening | ORAL | 1 refills | Status: DC | PRN
  Filled 2020-06-22: qty 30, 30d supply, fill #0

## 2020-06-22 MED ORDER — OMEPRAZOLE 20 MG PO CPDR
20.0000 mg | DELAYED_RELEASE_CAPSULE | Freq: Every day | ORAL | 3 refills | Status: DC
  Filled 2020-06-22: qty 30, 30d supply, fill #0

## 2020-06-22 NOTE — Patient Instructions (Signed)
Menopause and Hormone Replacement Therapy Menopause is a normal time of life when menstrual periods stop completely and the ovaries stop producing the female hormones estrogen and progesterone. Low levels of these hormones can affect your health and cause symptoms. Hormone replacement therapy (HRT) can relieve some of those symptoms. HRT is the use of artificial (synthetic) hormones to replace hormones that your body has stopped producing because you have reached menopause. Types of HRT HRT may consist of the synthetic hormones estrogen and progestin, or it may consist of estrogen-only therapy. You and your health care provider will decide which form of HRT is best for you. If you choose to be on HRT and you have a uterus, estrogen and progestin are usually prescribed. Estrogen-only therapy is used for women who do not have a uterus. Possible options for taking HRT include:  Pills.  Patches.  Gels.  Sprays.  Vaginal cream.  Vaginal rings.  Vaginal inserts. The amount of hormones that you take and how long you take them varies according to your health. It is important to:  Begin HRT with the lowest possible dosage.  Stop HRT as soon as your health care provider tells you to stop.  Work with your health care provider so that you feel informed and comfortable with your decisions.   Tell a health care provider about:  Any allergies you have.  Whether you have had blood clots or know of any risk factors you may have for blood clots.  Whether you or family members have had cancer, especially cancer of the breasts, ovaries, or uterus.  Any surgeries you have had.  All medicines you are taking, including vitamins, herbs, eye drops, creams, and over-the-counter medicines.  Whether you are pregnant or may be pregnant.  Any medical conditions you have. What are the benefits? HRT can reduce the frequency and severity of menopausal symptoms. Benefits of HRT vary according to the kind  of symptoms that you have, how severe they are, and your overall health. HRT may help to improve the following symptoms of menopause:  Hot flashes and night sweats. These are sudden feelings of heat that spread over the face and body. The skin may turn red, like a blush. Night sweats are hot flashes that happen while you are sleeping or trying to sleep.  Bone loss (osteoporosis). The body loses calcium more quickly after menopause, causing the bones to become weaker. This can increase the risk for bone breaks (fractures).  Vaginal dryness. The lining of the vagina can become thin and dry, which can cause pain during sex or cause infection, burning, or itching.  Urinary tract infections.  Urinary incontinence. This is the inability to control when you urinate.  Irritability.  Short-term memory problems. What are the risks? Risks of HRT vary depending on your individual health and medical history. Risks of HRT also depend on whether you receive both estrogen and progestin or you receive estrogen only. HRT may increase the risk of:  Spotting. This is when a small amount of blood leaks from the vagina unexpectedly.  Endometrial cancer. This cancer is in the lining of the uterus (endometrium).  Breast cancer.  Increased density of breast tissue. This can make it harder to find breast cancer on a breast X-ray (mammogram).  Stroke.  Heart disease.  Blood clots.  Gallbladder disease or liver disease. Risks of HRT can increase if you have any of the following conditions:  Endometrial cancer.  Liver disease.  Heart disease.  Breast cancer.    History of blood clots.  History of stroke. Follow these instructions at home: Pap tests  Have Pap tests done as often as told by your health care provider. A Pap test is sometimes called a Pap smear. It is a screening test that is used to check for signs of cancer of the cervix and vagina. A Pap test can also identify the presence of  infection or precancerous changes. Pap tests may be done: ? Every 3 years, starting at age 50. ? Every 5 years, starting after age 50, in combination with testing for human papillomavirus (HPV). ? More often or less often depending on other medical conditions you have, your age, and other risk factors.  It is up to you to get the results of your Pap test. Ask your health care provider, or the department that is doing the test, when your results will be ready. General instructions  Take over-the-counter and prescription medicines only as told by your health care provider.  Do not use any products that contain nicotine or tobacco. These products include cigarettes, chewing tobacco, and vaping devices, such as e-cigarettes. If you need help quitting, ask your health care provider.  Get mammograms, pelvic exams, and medical checkups as often as told by your health care provider.  Keep all follow-up visits. This is important. Contact a health care provider if you have:  Pain or swelling in your legs.  Lumps or changes in your breasts or armpits.  Pain, burning, or bleeding when you urinate.  Unusual vaginal bleeding.  Dizziness or headaches.  Pain in your abdomen. Get help right away if you have:  Shortness of breath.  Chest pain.  Slurred speech.  Weakness or numbness in any part of your arms or legs. These symptoms may represent a serious problem that is an emergency. Do not wait to see if the symptoms will go away. Get medical help right away. Call your local emergency services (911 in the [place] Do not drive yourself to the hospital. Summary  Menopause is a normal time of life when menstrual periods stop completely and the ovaries stop producing the female hormones estrogen and progesterone.  HRT can reduce the frequency and severity of menopausal symptoms.  Risks of HRT vary depending on your individual health and medical history. This information is not intended to  replace advice given to you by your health care provider. Make sure you discuss any questions you have with your health care provider. Document Revised: 08/09/2019 Document Reviewed: 08/09/2019 Elsevier Patient Education  [place].

## 2020-06-22 NOTE — Progress Notes (Signed)
Not sleeping Menopause.

## 2020-06-22 NOTE — Progress Notes (Signed)
Subjective:  Patient ID: Brandy [name], female    DOB: 01/08/1971  Age: 50 y.o. MRN: [id number]  CC: Insomnia   HPI Brandy [name] is a 50 year old female with a history of hypertension, hyperlipidemia anxiety and depressionhere fora follow up visit. Her anxiety and depression has worsened since her last visit. She has spoken with the LCSW for Psychotherapy sessions and is open to medications. She has noticed becoming easily irritable and this occurs for most days of the week. She has had insomnia for a month or two. She also has hot flashes which cause her to wake up at night. She wakes up and has trouble falling asleep. Also has vaginal dryness which has not improved with lubricants. Also has low libido. Symptoms worsened after she had her IUD removed.  Followed by Cardiology for angina and dyspnea and angina and work up so far including Echo and RHC have been negative except for dilated aortic root. Bending over to tie her shoe laces, talking gets her dyspneic and she sometimes has to stop talking to catch her breath.She denies history of smoking. Does not have a cough and does not wheeze.  Compliant with her antihypertensive and statin. Past Medical History:  Diagnosis Date  . Anxiety   . Arthritis   . Depression   . Disc degeneration, lumbar 2011  . Hypertension   . IBS (irritable bowel syndrome)     Past Surgical History:  Procedure Laterality Date  . RIGHT HEART CATH N/A 06/12/2020   Procedure: RIGHT HEART CATH;  Surgeon: Jolaine Artist, MD;  Location: [facility];  Service: Cardiovascular;  Laterality: N/A;  . TUBAL LIGATION      Family History  Problem Relation Age of Onset  . Hypertension Paternal Grandfather   . Prostate cancer Paternal Grandfather   . Breast cancer Mother 33  . Thyroid disease Mother   . Hyperlipidemia Mother   . Diabetes Father   . Hypertension Father        and aunt and uncles  . Thyroid disease Father   .  Hyperlipidemia Father   . Breast cancer Maternal Aunt 43    Allergies  Allergen Reactions  . Apple Hives    Outpatient Medications Prior to Visit  Medication Sig Dispense Refill  . aspirin EC 81 MG tablet Take 1 tablet (81 mg total) by mouth daily. Swallow whole. 30 tablet 11  . cyclobenzaprine (FLEXERIL) 10 MG tablet Take 1 tablet (10 mg total) by mouth 2 (two) times daily as needed for muscle spasms. 60 tablet 2  . ezetimibe (ZETIA) 10 MG tablet Take 1 tablet (10 mg total) by mouth daily. (Patient taking differently: Take 10 mg by mouth at bedtime.) 30 tablet 11  . hydrochlorothiazide (HYDRODIURIL) 25 MG tablet Take 1 tablet (25 mg total) by mouth daily. 30 tablet 3  . ibuprofen (ADVIL) 600 MG tablet Take 1 tablet (600 mg total) by mouth 2 (two) times daily as needed. (Patient taking differently: Take 600 mg by mouth 2 (two) times daily as needed for moderate pain or mild pain.) 60 tablet 3  . losartan (COZAAR) 50 MG tablet Take 1 tablet (50 mg total) by mouth daily. 30 tablet 3  . nitroGLYCERIN (NITROSTAT) 0.4 MG SL tablet Place 1 tablet (0.4 mg total) under the tongue every 5 (five) minutes as needed for chest pain. 25 tablet 3  . ondansetron (ZOFRAN ODT) 4 MG disintegrating tablet Take 1 tablet (4 mg total) by mouth every 8 (eight)  hours as needed for nausea or vomiting. 20 tablet 0  . Polyethyl Glycol-Propyl Glycol (SYSTANE) 0.4-0.3 % SOLN Place 1 drop into both eyes daily as needed (Dry eye).    . rosuvastatin (CRESTOR) 20 MG tablet Take 1 tablet (20 mg total) by mouth daily. (Patient taking differently: Take 20 mg by mouth at bedtime.) 30 tablet 11  . triamcinolone (NASACORT) 55 MCG/ACT AERO nasal inhaler Place 2 sprays into the nose daily. (Patient taking differently: Place 2 sprays into the nose daily as needed (Allergies).) 1 Inhaler 0  . omeprazole (PRILOSEC) 20 MG capsule Take 1 capsule (20 mg total) by mouth daily. 30 capsule 3  . amLODipine (NORVASC) 2.5 MG tablet TAKE 1 TABLET  (2.5 MG TOTAL) BY MOUTH DAILY. (Patient not taking: No sig reported) 30 tablet 0   No facility-administered medications prior to visit.     ROS Review of Systems  Constitutional: Negative for activity change, appetite change and fatigue.  HENT: Negative for congestion, sinus pressure and sore throat.   Eyes: Negative for visual disturbance.  Respiratory: Positive for shortness of breath. Negative for cough, chest tightness and wheezing.   Cardiovascular: Negative for chest pain and palpitations.  Gastrointestinal: Negative for abdominal distention, abdominal pain and constipation.  Endocrine: Negative for polydipsia.  Genitourinary: Negative for dysuria and frequency.  Musculoskeletal: Negative for arthralgias and back pain.  Skin: Negative for rash.  Neurological: Negative for tremors, light-headedness and numbness.  Hematological: Does not bruise/bleed easily.  Psychiatric/Behavioral: Positive for sleep disturbance. Negative for agitation and behavioral problems.    Objective:  BP 107/75   Pulse 82   Ht 5\' 7"  (1.702 m)   Wt 168 lb (76.2 kg)   SpO2 99%   BMI 26.31 kg/m   BP/Weight 06/22/2020 06/12/2020 02/20/7251  Systolic BP 664 403 474  Diastolic BP 75 88 82  Wt. (Lbs) 168 172 172  BMI 26.31 26.94 26.94      Physical Exam Constitutional:      Appearance: She is well-developed.  Neck:     Vascular: No JVD.  Cardiovascular:     Rate and Rhythm: Normal rate.     Heart sounds: Normal heart sounds. No murmur heard.   Pulmonary:     Effort: Pulmonary effort is normal.     Breath sounds: Normal breath sounds. No wheezing or rales.  Chest:     Chest wall: No tenderness.  Abdominal:     General: Bowel sounds are normal. There is no distension.     Palpations: Abdomen is soft. There is no mass.     Tenderness: There is no abdominal tenderness.  Musculoskeletal:        General: Normal range of motion.     Right lower leg: No edema.     Left lower leg: No edema.   Neurological:     Mental Status: She is alert and oriented to person, place, and time.  Psychiatric:        Mood and Affect: Mood normal.     CMP Latest Ref Rng & Units 06/22/2020 06/12/2020 06/12/2020  Glucose 65 - 99 mg/dL - - -  BUN 6 - 24 mg/dL - - -  Creatinine 0.57 - 1.00 mg/dL - - -  Sodium 135 - 145 mmol/L - 145 144  Potassium 3.5 - 5.1 mmol/L - 3.7 3.7  Chloride 96 - 106 mmol/L - - -  CO2 20 - 29 mmol/L - - -  Calcium 8.7 - 10.2 mg/dL - - -  Total Protein 6.0 - 8.5 g/dL 7.3 - -  Total Bilirubin 0.0 - 1.2 mg/dL 0.4 - -  Alkaline Phos 44 - 121 IU/L 92 - -  AST 0 - 40 IU/L 30 - -  ALT 0 - 32 IU/L 85(H) - -    Lipid Panel     Component Value Date/Time   CHOL 127 06/22/2020 0802   TRIG 123 06/22/2020 0802   HDL 43 06/22/2020 0802   CHOLHDL 3.0 06/22/2020 0802   CHOLHDL 4.2 09/10/2017 0959   VLDL 26 09/10/2017 0959   LDLCALC 62 06/22/2020 0802    CBC    Component Value Date/Time   WBC 5.8 06/07/2020 1040   WBC 6.6 06/09/2019 1232   RBC 5.10 06/07/2020 1040   RBC 4.97 06/09/2019 1232   HGB 14.6 06/12/2020 1319   HGB 15.2 06/07/2020 1040   HCT 43.0 06/12/2020 1319   HCT 45.1 06/07/2020 1040   PLT 264 06/07/2020 1040   MCV 88 06/07/2020 1040   MCH 29.8 06/07/2020 1040   MCH 30.6 05/14/2016 1418   MCHC 33.7 06/07/2020 1040   MCHC 33.3 06/09/2019 1232   RDW 14.5 06/07/2020 1040   LYMPHSABS 1.5 06/09/2019 1232   LYMPHSABS 1.7 06/26/2016 1623   MONOABS 0.4 06/09/2019 1232   EOSABS 0.0 06/09/2019 1232   EOSABS 0.0 06/26/2016 1623   BASOSABS 0.1 06/09/2019 1232   BASOSABS 0.0 06/26/2016 1623    Lab Results  Component Value Date   HGBA1C 5.4 09/10/2017    Assessment & Plan:  1. Gastroesophageal reflux disease without esophagitis Stable - omeprazole (PRILOSEC) 20 MG capsule; Take 1 capsule (20 mg total) by mouth daily.  Dispense: 30 capsule; Refill: 3  2. Anxiety and depression Uncontrolled Mood disorder associated with Menopause contributing Initiate  Effexor - venlafaxine XR (EFFEXOR XR) 75 MG 24 hr capsule; Take 1 capsule (75 mg total) by mouth daily with breakfast.  Dispense: 30 capsule; Refill: 3  3. Essential hypertension Controlled Counseled on blood pressure goal of less than 130/80, low-sodium, DASH diet, medication compliance, 150 minutes of moderate intensity exercise per week. Discussed medication compliance, adverse effects.   4. Menopausal vasomotor syndrome Discussed risks and benefits of HRT including recommended use for shortest possible duration Offered to commence vaginal estrogen but she would rather have the pill due to systemic effects Has a FHx of breast ca in mom Will refer to GYN to discuss - Ambulatory referral to Gynecology - venlafaxine XR (EFFEXOR XR) 75 MG 24 hr capsule; Take 1 capsule (75 mg total) by mouth daily with breakfast.  Dispense: 30 capsule; Refill: 3  5. Other insomnia Vasomotor symptom of Menopause Will hold off on Clonidine since she is being referred to GYN - hydrOXYzine (ATARAX/VISTARIL) 25 MG tablet; Take 1 tablet (25 mg total) by mouth at bedtime as needed.  Dispense: 30 tablet; Refill: 1  6. Other form of dyspnea Cardiac work up so far unrevealing If chest xray is negative consider Pulmonary referral - DG Chest 2 View; Future    Meds ordered this encounter  Medications  . venlafaxine XR (EFFEXOR XR) 75 MG 24 hr capsule    Sig: Take 1 capsule (75 mg total) by mouth daily with breakfast.    Dispense:  30 capsule    Refill:  3  . hydrOXYzine (ATARAX/VISTARIL) 25 MG tablet    Sig: Take 1 tablet (25 mg total) by mouth at bedtime as needed.    Dispense:  30 tablet    Refill:  1  . omeprazole (PRILOSEC) 20 MG capsule    Sig: Take 1 capsule (20 mg total) by mouth daily.    Dispense:  30 capsule    Refill:  3    Follow-up: Return in about 3 months (around 09/22/2020) for Chronic disease management.       Charlott Rakes, MD, FAAFP. [facility] and [place], [place]   06/22/2020, 4:45 PM

## 2020-06-27 ENCOUNTER — Encounter (HOSPITAL_BASED_OUTPATIENT_CLINIC_OR_DEPARTMENT_OTHER): Payer: Self-pay | Admitting: Obstetrics & Gynecology

## 2020-06-27 ENCOUNTER — Other Ambulatory Visit (HOSPITAL_BASED_OUTPATIENT_CLINIC_OR_DEPARTMENT_OTHER): Payer: Self-pay

## 2020-06-27 ENCOUNTER — Other Ambulatory Visit: Payer: Self-pay

## 2020-06-27 ENCOUNTER — Ambulatory Visit (INDEPENDENT_AMBULATORY_CARE_PROVIDER_SITE_OTHER): Payer: 59 | Admitting: Obstetrics & Gynecology

## 2020-06-27 ENCOUNTER — Other Ambulatory Visit (HOSPITAL_BASED_OUTPATIENT_CLINIC_OR_DEPARTMENT_OTHER)
Admission: RE | Admit: 2020-06-27 | Discharge: 2020-06-27 | Disposition: A | Discharge status: Home / Self Care | Payer: 59 | Source: Ambulatory Visit | Attending: Obstetrics & Gynecology | Admitting: Obstetrics & Gynecology

## 2020-06-27 VITALS — BP 113/82 | HR 81 | Ht 66.75 in | Wt 169.0 lb

## 2020-06-27 DIAGNOSIS — N951 Menopausal and female climacteric states: Secondary | ICD-10-CM | POA: Diagnosis not present

## 2020-06-27 DIAGNOSIS — N912 Amenorrhea, unspecified: Secondary | ICD-10-CM

## 2020-06-27 DIAGNOSIS — Z1231 Encounter for screening mammogram for malignant neoplasm of breast: Secondary | ICD-10-CM

## 2020-06-27 MED ORDER — GABAPENTIN 100 MG PO CAPS
ORAL_CAPSULE | ORAL | 0 refills | Status: DC
  Filled 2020-06-27: qty 60, 15d supply, fill #0

## 2020-06-27 NOTE — Progress Notes (Signed)
GYNECOLOGY  VISIT  CC:   Possible menopausal symptoms   HPI: 50 y.o. G32P2003 Divorced Black or [place] American female here for discussion of menopausal symptoms and possible treatment options.  She is referred by Dr. Margarita Rana, her PCP.  Pt reports she had a Mirena IUD about 7 years ago but didn't cycle during that time  Had it taken out about two years ago and never had a cycle after removal.  She's not had any hormonal testing.  Initially after removal, she didn't have any other symptoms but has recently had more issues with hot flashes, swelling, insomnia, irritability, vaginal dryness and decreased libido.  States she thinks if I could fix the insomnia and decreased libido, then she could handle the other issues.  The mood changes have been a bit surprising to her as she didn't realize this could be part of menopause as well.  Separately, she's been experiencing issues with dyspnea on exertion.  She is currently undergoing both cardiac and pulmonary evaluation for this.  Dr. Gasper Sells is her cardiologist.  She's had a coronary CT with coronary calcium score of 0.  Cardiac echo did show a borderline dilated ascending aorta.  She had a negative cardiac catheterization with Dr. Haroldine Laws on 06/12/2020.  There was no pulmonary artery hypertension either as this was questionable with prior cardiology testing.  She is on atorvastatin for elevated colterol levels.  She is now awaiting her pulmonary evaluation to continue evaluation for dyspnea.    GYNECOLOGIC HISTORY: No LMP recorded. Patient has had an ablation.  Patient Active Problem List   Diagnosis Date Noted  . Palpitations 04/24/2020  . Chest pain of uncertain etiology 81/85/6314  . Dyspareunia in female 03/21/2020  . DOE (dyspnea on exertion) 02/22/2020  . Familial hypercholesterolemia 02/22/2020  . Generalized abdominal pain 06/09/2019  . N&V (nausea and vomiting) 06/09/2019  . Essential hypertension 04/09/2019  . HLD (hyperlipidemia)  04/09/2019  . Irritable bowel syndrome (IBS) 04/09/2019  . Screening breast examination 01/19/2019  . Carpal tunnel syndrome 10/16/2017  . Anxiety and depression 10/16/2017    Past Medical History:  Diagnosis Date  . Anxiety   . Arthritis   . Depression   . Disc degeneration, lumbar 2011  . Hypertension   . IBS (irritable bowel syndrome)     Past Surgical History:  Procedure Laterality Date  . RIGHT HEART CATH N/A 06/12/2020   Procedure: RIGHT HEART CATH;  Surgeon: Jolaine Artist, MD;  Location: [facility];  Service: Cardiovascular;  Laterality: N/A;  . TUBAL LIGATION      MEDS:   Current Outpatient Medications on File Prior to Visit  Medication Sig Dispense Refill  . aspirin EC 81 MG tablet Take 1 tablet (81 mg total) by mouth daily. Swallow whole. 30 tablet 11  . cyclobenzaprine (FLEXERIL) 10 MG tablet Take 1 tablet (10 mg total) by mouth 2 (two) times daily as needed for muscle spasms. 60 tablet 2  . ezetimibe (ZETIA) 10 MG tablet Take 1 tablet (10 mg total) by mouth daily. (Patient taking differently: Take 10 mg by mouth at bedtime.) 30 tablet 11  . hydrochlorothiazide (HYDRODIURIL) 25 MG tablet Take 1 tablet (25 mg total) by mouth daily. 30 tablet 3  . hydrOXYzine (ATARAX/VISTARIL) 25 MG tablet Take 1 tablet (25 mg total) by mouth at bedtime as needed. 30 tablet 1  . ibuprofen (ADVIL) 600 MG tablet Take 1 tablet (600 mg total) by mouth 2 (two) times daily as needed. (Patient taking differently:  Take 600 mg by mouth 2 (two) times daily as needed for moderate pain or mild pain.) 60 tablet 3  . losartan (COZAAR) 50 MG tablet Take 1 tablet (50 mg total) by mouth daily. 30 tablet 3  . nitroGLYCERIN (NITROSTAT) 0.4 MG SL tablet Place 1 tablet (0.4 mg total) under the tongue every 5 (five) minutes as needed for chest pain. 25 tablet 3  . omeprazole (PRILOSEC) 20 MG capsule Take 1 capsule (20 mg total) by mouth daily. 30 capsule 3  . ondansetron (ZOFRAN ODT) 4 MG  disintegrating tablet Take 1 tablet (4 mg total) by mouth every 8 (eight) hours as needed for nausea or vomiting. 20 tablet 0  . Polyethyl Glycol-Propyl Glycol (SYSTANE) 0.4-0.3 % SOLN Place 1 drop into both eyes daily as needed (Dry eye).    . rosuvastatin (CRESTOR) 20 MG tablet Take 1 tablet (20 mg total) by mouth daily. (Patient taking differently: Take 20 mg by mouth at bedtime.) 30 tablet 11  . triamcinolone (NASACORT) 55 MCG/ACT AERO nasal inhaler Place 2 sprays into the nose daily. (Patient taking differently: Place 2 sprays into the nose daily as needed (Allergies).) 1 Inhaler 0  . venlafaxine XR (EFFEXOR XR) 75 MG 24 hr capsule Take 1 capsule (75 mg total) by mouth daily with breakfast. 30 capsule 3   No current facility-administered medications on file prior to visit.    ALLERGIES: Apple  Family History  Problem Relation Age of Onset  . Hypertension Paternal Grandfather   . Prostate cancer Paternal Grandfather   . Breast cancer Mother 63  . Thyroid disease Mother   . Hyperlipidemia Mother   . Diabetes Father   . Hypertension Father        and aunt and uncles  . Thyroid disease Father   . Hyperlipidemia Father   . Breast cancer Maternal Aunt 26    SH:  Divorced, non smoker  Review of Systems  Constitutional:       Hot flashes, insomnia  Psychiatric/Behavioral:       Mood changes   PHYSICAL EXAMINATION:    BP 113/82   Pulse 81   Ht 5' 6.75" (1.695 m)   Wt 169 lb (76.7 kg)   BMI 26.67 kg/m     Physical Exam Constitutional:      Appearance: Normal appearance.  Neurological:     General: No focal deficit present.     Mental Status: She is alert.  Psychiatric:        Mood and Affect: Mood normal.    Assessment/Plan: 1. Amenorrhea - will check [place] to ensure she is menopausal.  If this is normal, she will need to return for additional blood work. - [place]; Future  2. Encounter for screening mammogram for malignant neoplasm of breast - last MMG was 2019.  Pt  aware this is overdue.  - MM 3D SCREEN BREAST BILATERAL; Future  3. Menopausal symptoms - give ongoing evaluation for cardio/pulmonary causes of her shortness of breath, I am concerned about starting HRT at this time.  Would like for evaluation to be complete before considering any estrogen given DVT/PE, MI risks.  D/w pt and she voices understanding. - OTC options for vaginal dryness reviewed - Alternative options reviewed.  Will start with gabpentin.  Dosing and increasing dosages discussed.  Pt will give update in about 2 weeks. - gabapentin (NEURONTIN) 100 MG capsule; Take 1 capsule by mouth nightly for 3 nights, then increase every 3 nights up to 4 capsules.  Dispense: 60 capsule; Refill: 0

## 2020-06-27 NOTE — Patient Instructions (Signed)
[place]

## 2020-06-28 ENCOUNTER — Telehealth: Payer: Self-pay

## 2020-06-28 DIAGNOSIS — Z006 Encounter for examination for normal comparison and control in clinical research program: Secondary | ICD-10-CM

## 2020-06-28 LAB — FOLLICLE STIMULATING HORMONE: FSH: 122 m[IU]/mL

## 2020-06-28 NOTE — Telephone Encounter (Signed)
Called patient for 90 day Identify phone call, patient stated she is still having some of the same cardiac symptoms that brought her in, she also stated that she has followed up with her doctor regarding these symptoms on 06/07/2020 and was advised to have a diagnostic heart catheterization, which was done on 06/12/2020. She is feeling better as of now. Lastly I informed her we would be calling her one once more for a year follow up call regarding a follow up in the study

## 2020-07-02 ENCOUNTER — Encounter (HOSPITAL_BASED_OUTPATIENT_CLINIC_OR_DEPARTMENT_OTHER): Payer: Self-pay | Admitting: Obstetrics & Gynecology

## 2020-07-04 ENCOUNTER — Ambulatory Visit (HOSPITAL_BASED_OUTPATIENT_CLINIC_OR_DEPARTMENT_OTHER)
Admission: RE | Admit: 2020-07-04 | Discharge: 2020-07-04 | Disposition: A | Discharge status: Home / Self Care | Payer: 59 | Source: Ambulatory Visit | Attending: Obstetrics & Gynecology | Admitting: Obstetrics & Gynecology

## 2020-07-04 ENCOUNTER — Other Ambulatory Visit: Payer: Self-pay

## 2020-07-04 ENCOUNTER — Encounter (HOSPITAL_BASED_OUTPATIENT_CLINIC_OR_DEPARTMENT_OTHER): Payer: Self-pay

## 2020-07-04 DIAGNOSIS — Z1231 Encounter for screening mammogram for malignant neoplasm of breast: Secondary | ICD-10-CM | POA: Diagnosis not present

## 2020-07-05 NOTE — Telephone Encounter (Signed)
Please advise. tbw

## 2020-07-06 ENCOUNTER — Other Ambulatory Visit (HOSPITAL_BASED_OUTPATIENT_CLINIC_OR_DEPARTMENT_OTHER): Payer: Self-pay | Admitting: Obstetrics & Gynecology

## 2020-07-06 ENCOUNTER — Telehealth (HOSPITAL_BASED_OUTPATIENT_CLINIC_OR_DEPARTMENT_OTHER): Payer: Self-pay | Admitting: *Deleted

## 2020-07-06 MED ORDER — NONFORMULARY OR COMPOUNDED ITEM: 3 refills | Status: DC

## 2020-07-06 NOTE — Telephone Encounter (Signed)
DOB verified. Pt called in to follow up on her myChart message. She is requesting a cream for vaginal dryness to be sent to her pharmacy. Advised that I would send her request to the provider and let her know the recommendations later today. Pt verbalized understanding.

## 2020-07-06 NOTE — Progress Notes (Signed)
rx

## 2020-07-07 NOTE — Telephone Encounter (Signed)
Called pt 5/19.  Rx for vit E cream was sent to [facility].  Pt has questions about OTC supplement.  I looked it up and it contains progesterone, testosterone, DEHA.  Do not recommend she take this as SOB has not been resolved and pulmonology evaluation has not been completed.  We discussed my concern about any product that could increase risk for pulmonary emboli, DVT unless cardiovascular and pulmonary evaluation completed.  Pt voiced clear understanding.

## 2020-07-07 NOTE — Telephone Encounter (Signed)
Rx has been sent to pharmacy for Vit E vaginal cream.  Rx to [facility].  Pt aware.  Additional questions about supplements answered.  This is documented in separate note.

## 2020-07-24 ENCOUNTER — Other Ambulatory Visit: Payer: Self-pay

## 2020-07-25 ENCOUNTER — Other Ambulatory Visit: Payer: Self-pay

## 2020-08-03 ENCOUNTER — Other Ambulatory Visit (HOSPITAL_BASED_OUTPATIENT_CLINIC_OR_DEPARTMENT_OTHER): Payer: Self-pay | Admitting: Obstetrics & Gynecology

## 2020-08-03 DIAGNOSIS — N951 Menopausal and female climacteric states: Secondary | ICD-10-CM

## 2020-08-04 ENCOUNTER — Other Ambulatory Visit (HOSPITAL_BASED_OUTPATIENT_CLINIC_OR_DEPARTMENT_OTHER): Payer: Self-pay

## 2020-08-04 ENCOUNTER — Encounter (HOSPITAL_BASED_OUTPATIENT_CLINIC_OR_DEPARTMENT_OTHER): Payer: Self-pay

## 2020-08-04 MED ORDER — GABAPENTIN 100 MG PO CAPS
ORAL_CAPSULE | ORAL | 0 refills | Status: DC
  Filled 2020-08-04 – 2020-08-09 (×2): qty 90, 30d supply, fill #0

## 2020-08-08 ENCOUNTER — Other Ambulatory Visit: Payer: Self-pay

## 2020-08-09 ENCOUNTER — Other Ambulatory Visit: Payer: Self-pay

## 2020-08-09 ENCOUNTER — Other Ambulatory Visit (HOSPITAL_BASED_OUTPATIENT_CLINIC_OR_DEPARTMENT_OTHER): Payer: Self-pay

## 2020-08-09 ENCOUNTER — Encounter: Payer: Self-pay | Admitting: Family Medicine

## 2020-08-10 ENCOUNTER — Encounter (HOSPITAL_BASED_OUTPATIENT_CLINIC_OR_DEPARTMENT_OTHER): Payer: Self-pay

## 2020-08-10 NOTE — Telephone Encounter (Signed)
DPR reviewed. Called and LMOM at 3:40 letting patient know that we have received her message. However, Dr. Sabra Heck is out of the office this week and will not return until Monday, August 14, 2020. I will forward the message to Dr. Sabra Heck. We will contact her and let her know per Dr. Sabra Heck response. tbw

## 2020-08-14 ENCOUNTER — Other Ambulatory Visit: Payer: Self-pay

## 2020-08-14 ENCOUNTER — Other Ambulatory Visit (HOSPITAL_BASED_OUTPATIENT_CLINIC_OR_DEPARTMENT_OTHER): Payer: Self-pay | Admitting: Obstetrics & Gynecology

## 2020-08-14 MED ORDER — PROGESTERONE 200 MG PO CAPS
ORAL_CAPSULE | ORAL | 3 refills | Status: DC
  Filled 2020-08-14: qty 15, 30d supply, fill #0
  Filled 2020-08-28 – 2020-09-06 (×3): qty 15, 30d supply, fill #1
  Filled 2020-10-18: qty 15, 30d supply, fill #2
  Filled 2020-11-16: qty 15, 30d supply, fill #3

## 2020-08-14 MED ORDER — ESTRADIOL 0.05 MG/24HR TD PTTW
1.0000 | MEDICATED_PATCH | TRANSDERMAL | 3 refills | Status: DC
  Filled 2020-08-14: qty 8, 28d supply, fill #0
  Filled 2020-08-28 – 2020-09-02 (×2): qty 8, 28d supply, fill #1
  Filled 2020-09-25: qty 8, 28d supply, fill #2
  Filled 2020-11-01: qty 8, 28d supply, fill #3

## 2020-08-14 NOTE — Progress Notes (Signed)
Spoke with pt.  She has communicated with cardiologist and has been okayed to start HRT.  Risks reviewed including DVT, PE, MI, breast cancer.  Mother and aunt with hx of breast cancer.  Has updated MMG.  Tyrer cusick model done with pt.  23% lifetime risk.  Breast MRI discussed.  Pt just wants to try HRT to see if will help as she is very symptomatic.  Estradiol 0.05% patch to skin twice weekly and Prometrium 200mg  days 1-15 each month to pharmacy.  She will give update in 4-6 weeks.

## 2020-08-15 ENCOUNTER — Other Ambulatory Visit: Payer: Self-pay

## 2020-08-16 ENCOUNTER — Other Ambulatory Visit: Payer: Self-pay

## 2020-08-29 ENCOUNTER — Other Ambulatory Visit: Payer: Self-pay

## 2020-09-04 ENCOUNTER — Other Ambulatory Visit: Payer: Self-pay

## 2020-09-05 ENCOUNTER — Other Ambulatory Visit: Payer: Self-pay

## 2020-09-06 ENCOUNTER — Other Ambulatory Visit: Payer: Self-pay

## 2020-09-12 ENCOUNTER — Other Ambulatory Visit: Payer: Self-pay

## 2020-09-12 ENCOUNTER — Encounter (HOSPITAL_BASED_OUTPATIENT_CLINIC_OR_DEPARTMENT_OTHER): Payer: Self-pay | Admitting: Obstetrics & Gynecology

## 2020-09-12 ENCOUNTER — Ambulatory Visit (INDEPENDENT_AMBULATORY_CARE_PROVIDER_SITE_OTHER): Payer: 59 | Admitting: *Deleted

## 2020-09-12 ENCOUNTER — Other Ambulatory Visit (HOSPITAL_BASED_OUTPATIENT_CLINIC_OR_DEPARTMENT_OTHER): Payer: Self-pay | Admitting: Obstetrics & Gynecology

## 2020-09-12 ENCOUNTER — Other Ambulatory Visit (HOSPITAL_COMMUNITY)
Admission: RE | Admit: 2020-09-12 | Discharge: 2020-09-12 | Disposition: A | Discharge status: Home / Self Care | Payer: 59 | Source: Ambulatory Visit | Attending: Obstetrics & Gynecology | Admitting: Obstetrics & Gynecology

## 2020-09-12 DIAGNOSIS — N898 Other specified noninflammatory disorders of vagina: Secondary | ICD-10-CM | POA: Insufficient documentation

## 2020-09-12 NOTE — Progress Notes (Signed)
Patient came in today with complaints of vaginal discharge and itching. Patient states it has been unbearable. Per Dr. Sabra Heck patient did a self swab aptima sample. It has been sent to lab to be processed. tbw

## 2020-09-13 ENCOUNTER — Telehealth (HOSPITAL_BASED_OUTPATIENT_CLINIC_OR_DEPARTMENT_OTHER): Payer: Self-pay

## 2020-09-13 ENCOUNTER — Encounter (HOSPITAL_BASED_OUTPATIENT_CLINIC_OR_DEPARTMENT_OTHER): Payer: Self-pay

## 2020-09-13 ENCOUNTER — Other Ambulatory Visit: Payer: Self-pay

## 2020-09-13 ENCOUNTER — Other Ambulatory Visit (HOSPITAL_BASED_OUTPATIENT_CLINIC_OR_DEPARTMENT_OTHER): Payer: Self-pay | Admitting: Obstetrics & Gynecology

## 2020-09-13 LAB — CERVICOVAGINAL ANCILLARY ONLY
Bacterial Vaginitis (gardnerella): POSITIVE — AB
Candida Glabrata: NEGATIVE
Candida Vaginitis: POSITIVE — AB
Comment: NEGATIVE
Comment: NEGATIVE
Comment: NEGATIVE

## 2020-09-13 MED ORDER — METRONIDAZOLE 500 MG PO TABS
500.0000 mg | ORAL_TABLET | Freq: Two times a day (BID) | ORAL | 0 refills | Status: DC
  Filled 2020-09-13: qty 14, 7d supply, fill #0

## 2020-09-13 MED ORDER — TERCONAZOLE 0.4 % VA CREA
1.0000 | TOPICAL_CREAM | Freq: Every day | VAGINAL | 0 refills | Status: DC
  Filled 2020-09-13: qty 45, 7d supply, fill #0

## 2020-09-13 NOTE — Progress Notes (Signed)
Rx for flagyl and terazol sent to pharmacy on file.

## 2020-09-18 ENCOUNTER — Ambulatory Visit: Payer: 59 | Admitting: Family Medicine

## 2020-09-18 NOTE — Telephone Encounter (Signed)
Opened in error

## 2020-09-18 NOTE — Telephone Encounter (Signed)
Entered in error. tbw

## 2020-09-20 ENCOUNTER — Other Ambulatory Visit (HOSPITAL_COMMUNITY): Payer: 59

## 2020-09-25 ENCOUNTER — Other Ambulatory Visit: Payer: Self-pay

## 2020-09-26 ENCOUNTER — Other Ambulatory Visit: Payer: Self-pay

## 2020-09-27 ENCOUNTER — Other Ambulatory Visit: Payer: Self-pay

## 2020-10-18 ENCOUNTER — Other Ambulatory Visit: Payer: Self-pay

## 2020-10-19 ENCOUNTER — Other Ambulatory Visit: Payer: Self-pay

## 2020-11-01 ENCOUNTER — Other Ambulatory Visit (HOSPITAL_BASED_OUTPATIENT_CLINIC_OR_DEPARTMENT_OTHER): Payer: Self-pay | Admitting: Obstetrics & Gynecology

## 2020-11-01 ENCOUNTER — Other Ambulatory Visit: Payer: Self-pay

## 2020-11-01 DIAGNOSIS — N951 Menopausal and female climacteric states: Secondary | ICD-10-CM

## 2020-11-02 ENCOUNTER — Other Ambulatory Visit: Payer: Self-pay

## 2020-11-06 ENCOUNTER — Other Ambulatory Visit: Payer: Self-pay

## 2020-11-06 MED ORDER — GABAPENTIN 100 MG PO CAPS
ORAL_CAPSULE | ORAL | 0 refills | Status: DC
  Filled 2020-11-06: qty 90, 30d supply, fill #0

## 2020-11-07 ENCOUNTER — Other Ambulatory Visit: Payer: Self-pay

## 2020-11-16 ENCOUNTER — Other Ambulatory Visit: Payer: Self-pay

## 2020-11-21 ENCOUNTER — Other Ambulatory Visit: Payer: Self-pay | Admitting: Family Medicine

## 2020-11-21 ENCOUNTER — Other Ambulatory Visit (HOSPITAL_BASED_OUTPATIENT_CLINIC_OR_DEPARTMENT_OTHER): Payer: Self-pay | Admitting: Obstetrics & Gynecology

## 2020-11-21 ENCOUNTER — Other Ambulatory Visit: Payer: Self-pay

## 2020-11-21 DIAGNOSIS — I10 Essential (primary) hypertension: Secondary | ICD-10-CM

## 2020-11-22 ENCOUNTER — Other Ambulatory Visit: Payer: Self-pay

## 2020-11-22 MED ORDER — HYDROCHLOROTHIAZIDE 25 MG PO TABS
25.0000 mg | ORAL_TABLET | Freq: Every day | ORAL | 3 refills | Status: DC
  Filled 2020-11-22: qty 30, 30d supply, fill #0

## 2020-11-23 ENCOUNTER — Other Ambulatory Visit: Payer: Self-pay

## 2020-11-23 MED ORDER — ESTRADIOL 0.05 MG/24HR TD PTTW
1.0000 | MEDICATED_PATCH | TRANSDERMAL | 3 refills | Status: DC
Start: 2020-11-23 — End: 2021-03-31
  Filled 2020-11-23: qty 8, 28d supply, fill #0

## 2020-11-27 ENCOUNTER — Other Ambulatory Visit: Payer: Self-pay

## 2020-12-18 ENCOUNTER — Other Ambulatory Visit: Payer: Self-pay

## 2020-12-19 ENCOUNTER — Other Ambulatory Visit (HOSPITAL_BASED_OUTPATIENT_CLINIC_OR_DEPARTMENT_OTHER): Payer: Self-pay | Admitting: Obstetrics & Gynecology

## 2020-12-19 ENCOUNTER — Other Ambulatory Visit (HOSPITAL_BASED_OUTPATIENT_CLINIC_OR_DEPARTMENT_OTHER): Payer: Self-pay

## 2020-12-19 MED ORDER — PROGESTERONE 200 MG PO CAPS: ORAL_CAPSULE | ORAL | 6 refills | Status: DC

## 2020-12-21 ENCOUNTER — Other Ambulatory Visit: Payer: Self-pay | Admitting: Family Medicine

## 2020-12-21 DIAGNOSIS — I10 Essential (primary) hypertension: Secondary | ICD-10-CM

## 2020-12-21 NOTE — Telephone Encounter (Signed)
Patient called, left VM to return the call to the office for scheduling an appt for medication refill request.

## 2020-12-21 NOTE — Telephone Encounter (Signed)
Requested medication (s) are due for refill today: Yes  Requested medication (s) are on the active medication list: Yes  Last refill:  06/15/2020  Future visit scheduled: no  Notes to clinic:  Unable to refill per protocol, appointment needed. Pt called and left VM to call back to schedule appt     Requested Prescriptions  Pending Prescriptions Disp Refills   losartan (COZAAR) 50 MG tablet 30 tablet 3    Sig: Take 1 tablet (50 mg total) by mouth daily.     Cardiovascular:  Angiotensin Receptor Blockers Failed - 12/21/2020  1:13 PM      Failed - Cr in normal range and within 180 days    Creatinine, Ser  Date Value Ref Range Status  06/07/2020 0.86 0.57 - 1.00 mg/dL Final          Failed - K in normal range and within 180 days    Potassium  Date Value Ref Range Status  06/12/2020 3.7 3.5 - 5.1 mmol/L Final          Failed - Valid encounter within last 6 months    Recent Outpatient Visits           6 months ago Anxiety and depression   [place], Charlane Ferretti, MD   10 months ago Angina pectoris [facility])   [place], Charlane Ferretti, MD   10 months ago Nausea and vomiting, intractability of vomiting not specified, unspecified vomiting type   [place], Enobong, MD   1 year ago Irritable bowel syndrome with diarrhea   [place], [place]   2 years ago Sciatica of right side   [place], [place]              Passed - Patient is not pregnant      Passed - Last BP in normal range    BP Readings from Last 1 Encounters:  06/27/20 113/82

## 2020-12-21 NOTE — Telephone Encounter (Signed)
Medication Refill - Medication: Losartin 50 mg  Has the patient contacted their pharmacy? Yes.   (Agent: If no, request that the patient contact the pharmacy for the refill. If patient does not wish to contact the pharmacy document the reason why and proceed with request.) (Agent: If yes, when and what did the pharmacy advise?)  Preferred Pharmacy (with phone number or street name): [facility] Has the patient been seen for an appointment in the last year OR does the patient have an upcoming appointment? yes  Agent: Please be advised that RX refills may take up to 3 business days. We ask that you follow-up with your pharmacy.

## 2020-12-22 MED ORDER — LOSARTAN POTASSIUM 50 MG PO TABS: 50.0000 mg | ORAL_TABLET | Freq: Every day | ORAL | 0 refills | Status: DC

## 2021-01-03 ENCOUNTER — Encounter: Payer: Self-pay | Admitting: Family Medicine

## 2021-01-03 ENCOUNTER — Other Ambulatory Visit: Payer: Self-pay

## 2021-01-03 ENCOUNTER — Ambulatory Visit: Payer: 59 | Attending: Family Medicine | Admitting: Family Medicine

## 2021-01-03 VITALS — BP 129/89 | HR 80 | Ht 67.0 in | Wt 176.2 lb

## 2021-01-03 DIAGNOSIS — F32A Depression, unspecified: Secondary | ICD-10-CM

## 2021-01-03 DIAGNOSIS — L819 Disorder of pigmentation, unspecified: Secondary | ICD-10-CM | POA: Diagnosis not present

## 2021-01-03 DIAGNOSIS — M5431 Sciatica, right side: Secondary | ICD-10-CM

## 2021-01-03 DIAGNOSIS — F419 Anxiety disorder, unspecified: Secondary | ICD-10-CM

## 2021-01-03 DIAGNOSIS — R7303 Prediabetes: Secondary | ICD-10-CM | POA: Diagnosis not present

## 2021-01-03 DIAGNOSIS — K219 Gastro-esophageal reflux disease without esophagitis: Secondary | ICD-10-CM

## 2021-01-03 DIAGNOSIS — I10 Essential (primary) hypertension: Secondary | ICD-10-CM

## 2021-01-03 DIAGNOSIS — R0789 Other chest pain: Secondary | ICD-10-CM

## 2021-01-03 DIAGNOSIS — Z1211 Encounter for screening for malignant neoplasm of colon: Secondary | ICD-10-CM

## 2021-01-03 LAB — POCT GLYCOSYLATED HEMOGLOBIN (HGB A1C): HbA1c, POC (prediabetic range): 6 % (ref 5.7–6.4)

## 2021-01-03 MED ORDER — HYDROCHLOROTHIAZIDE 25 MG PO TABS: 25.0000 mg | ORAL_TABLET | Freq: Every day | ORAL | 6 refills | Status: DC

## 2021-01-03 MED ORDER — SERTRALINE HCL 50 MG PO TABS: 50.0000 mg | ORAL_TABLET | Freq: Every day | ORAL | 6 refills | Status: DC

## 2021-01-03 MED ORDER — IBUPROFEN 600 MG PO TABS: 600.0000 mg | ORAL_TABLET | Freq: Two times a day (BID) | ORAL | 3 refills | Status: DC | PRN

## 2021-01-03 MED ORDER — LOSARTAN POTASSIUM 50 MG PO TABS: 50.0000 mg | ORAL_TABLET | Freq: Every day | ORAL | 6 refills | Status: DC

## 2021-01-03 MED ORDER — OMEPRAZOLE 20 MG PO CPDR: 20.0000 mg | DELAYED_RELEASE_CAPSULE | Freq: Every day | ORAL | 3 refills | Status: DC

## 2021-01-03 NOTE — Progress Notes (Signed)
Subjective:  Patient ID: Brandy [name], female    DOB: [birth year]  Age: 50 y.o. MRN: [id number]  CC: Hypertension   HPI Brandy [name] is a 50 y.o. year old female with a history of hypertension, hyperlipidemia, anxiety and depression here for a follow up visit.  Interval History: She has been on HRT and has not noticed any difference in her menopause symptoms and her cycles which had previously stopped have returned (returned in 09/2020) Her HRT was prescribed by GYN-Dr. Sabra Heck.  Continues to experience low motivation, low energy and was on Effexor which she discontinued as she thought it made her sleepy. Got laid off 3 months ago and is currently jobhunting.  She noticed  hypopigmentation of her L arm and inner thighs over the last year; states her sister and her dad have similar lesions Noticed hyperpigmentation which look like bruises on her R side, chin, toes but she denies history of trauma.  Out of the blues she does have chest tightness which is sometimes reproducible and lasts a few minutes and cardiac work-up so far has been negative.  She was prescribed PPI but she states she does not think this is related to reflux. Past Medical History:  Diagnosis Date   Anxiety    Arthritis    Depression    Disc degeneration, lumbar 2011   Hypertension    IBS (irritable bowel syndrome)     Past Surgical History:  Procedure Laterality Date   RIGHT HEART CATH N/A 06/12/2020   Procedure: RIGHT HEART CATH;  Surgeon: Jolaine Artist, MD;  Location: [facility];  Service: Cardiovascular;  Laterality: N/A;   TUBAL LIGATION      Family History  Problem Relation Age of Onset   Breast cancer Mother 30       ER+   Thyroid disease Mother    Hyperlipidemia Mother    Diabetes Father    Hypertension Father        and aunt and uncles   Thyroid disease Father    Hyperlipidemia Father    Hypertension Paternal Grandfather    Prostate cancer Paternal Grandfather     Breast cancer Maternal Aunt 33    Allergies  Allergen Reactions   Apple Hives    Outpatient Medications Prior to Visit  Medication Sig Dispense Refill   aspirin EC 81 MG tablet Take 1 tablet (81 mg total) by mouth daily. Swallow whole. 30 tablet 11   cyclobenzaprine (FLEXERIL) 10 MG tablet Take 1 tablet (10 mg total) by mouth 2 (two) times daily as needed for muscle spasms. 60 tablet 2   estradiol (VIVELLE-DOT) 0.05 MG/24HR patch Place 1 patch (0.05 mg total) onto the skin 2 (two) times a week. 8 patch 3   gabapentin (NEURONTIN) 100 MG capsule Take up to 3 capsules by mouth nightly. 90 capsule 0   nitroGLYCERIN (NITROSTAT) 0.4 MG SL tablet Place 1 tablet (0.4 mg total) under the tongue every 5 (five) minutes as needed for chest pain. 25 tablet 3   NONFORMULARY OR COMPOUNDED ITEM Vitamin E vaginal cream 200u/ml.  One ml pv three times weekly. 36 each 3   Polyethyl Glycol-Propyl Glycol (SYSTANE) 0.4-0.3 % SOLN Place 1 drop into both eyes daily as needed (Dry eye).     progesterone (PROMETRIUM) 200 MG capsule Take 1 capsule daily (at night) days 1-15 each month. 15 capsule 6   terconazole (TERAZOL 7) 0.4 % vaginal cream Place 1 applicator vaginally daily. Use for 7 days  45 g 0   triamcinolone (NASACORT) 55 MCG/ACT AERO nasal inhaler Place 2 sprays into the nose daily. (Patient taking differently: Place 2 sprays into the nose daily as needed (Allergies).) 1 Inhaler 0   hydrochlorothiazide (HYDRODIURIL) 25 MG tablet Take 1 tablet (25 mg total) by mouth daily. 30 tablet 3   hydrOXYzine (ATARAX/VISTARIL) 25 MG tablet Take 1 tablet (25 mg total) by mouth at bedtime as needed. 30 tablet 1   ibuprofen (ADVIL) 600 MG tablet Take 1 tablet (600 mg total) by mouth 2 (two) times daily as needed. (Patient taking differently: Take 600 mg by mouth 2 (two) times daily as needed for moderate pain or mild pain.) 60 tablet 3   losartan (COZAAR) 50 MG tablet Take 1 tablet (50 mg total) by mouth daily. 30 tablet 0    omeprazole (PRILOSEC) 20 MG capsule Take 1 capsule (20 mg total) by mouth daily. 30 capsule 3   venlafaxine XR (EFFEXOR XR) 75 MG 24 hr capsule Take 1 capsule (75 mg total) by mouth daily with breakfast. 30 capsule 3   ezetimibe (ZETIA) 10 MG tablet Take 1 tablet (10 mg total) by mouth daily. (Patient taking differently: Take 10 mg by mouth at bedtime.) 30 tablet 11   metroNIDAZOLE (FLAGYL) 500 MG tablet Take 1 tablet (500 mg total) by mouth 2 (two) times daily. (Patient not taking: Reported on 01/03/2021) 14 tablet 0   ondansetron (ZOFRAN ODT) 4 MG disintegrating tablet Take 1 tablet (4 mg total) by mouth every 8 (eight) hours as needed for nausea or vomiting. (Patient not taking: Reported on 01/03/2021) 20 tablet 0   rosuvastatin (CRESTOR) 20 MG tablet Take 1 tablet (20 mg total) by mouth daily. (Patient taking differently: Take 20 mg by mouth at bedtime.) 30 tablet 11   No facility-administered medications prior to visit.     ROS Review of Systems  Constitutional:  Negative for activity change, appetite change and fatigue.  HENT:  Negative for congestion, sinus pressure and sore throat.   Eyes:  Negative for visual disturbance.  Respiratory:  Negative for cough, chest tightness, shortness of breath and wheezing.   Cardiovascular:  Positive for chest pain. Negative for palpitations.  Gastrointestinal:  Negative for abdominal distention, abdominal pain and constipation.  Endocrine: Negative for polydipsia.  Genitourinary:  Negative for dysuria and frequency.  Musculoskeletal:  Negative for arthralgias and back pain.  Skin:  Positive for rash.  Neurological:  Negative for tremors, light-headedness and numbness.  Hematological:  Does not bruise/bleed easily.  Psychiatric/Behavioral:  Positive for dysphoric mood. Negative for agitation and behavioral problems.    Objective:  BP 129/89   Pulse 80   Ht 5\' 7"  (1.702 m)   Wt 176 lb 3.2 oz (79.9 kg)   SpO2 99%   BMI 27.60 kg/m    BP/Weight 01/03/2021 3/81/0175 1/0/2585  Systolic BP 277 824 235  Diastolic BP 89 82 75  Wt. (Lbs) 176.2 169 168  BMI 27.6 26.67 26.31      Physical Exam Constitutional:      Appearance: She is well-developed.  Cardiovascular:     Rate and Rhythm: Normal rate.     Heart sounds: Normal heart sounds. No murmur heard. Pulmonary:     Effort: Pulmonary effort is normal.     Breath sounds: Normal breath sounds. No wheezing or rales.  Chest:     Chest wall: No tenderness.  Abdominal:     General: Bowel sounds are normal. There is no distension.  Palpations: Abdomen is soft. There is no mass.     Tenderness: There is no abdominal tenderness.  Musculoskeletal:        General: Normal range of motion.     Right lower leg: No edema.     Left lower leg: No edema.  Skin:    Comments: Hypopigmented patches on inner aspect of left forearm, medial aspect of bilateral thighs Hyperpigmented patches on dorsum of bilateral feet, chin, right side, left side  Neurological:     Mental Status: She is alert and oriented to person, place, and time.  Psychiatric:        Mood and Affect: Mood normal.    CMP Latest Ref Rng & Units 06/22/2020 06/12/2020 06/12/2020  Glucose 65 - 99 mg/dL - - -  BUN 6 - 24 mg/dL - - -  Creatinine 0.57 - 1.00 mg/dL - - -  Sodium 135 - 145 mmol/L - 145 144  Potassium 3.5 - 5.1 mmol/L - 3.7 3.7  Chloride 96 - 106 mmol/L - - -  CO2 20 - 29 mmol/L - - -  Calcium 8.7 - 10.2 mg/dL - - -  Total Protein 6.0 - 8.5 g/dL 7.3 - -  Total Bilirubin 0.0 - 1.2 mg/dL 0.4 - -  Alkaline Phos 44 - 121 IU/L 92 - -  AST 0 - 40 IU/L 30 - -  ALT 0 - 32 IU/L 85(H) - -    Lipid Panel     Component Value Date/Time   CHOL 127 06/22/2020 0802   TRIG 123 06/22/2020 0802   HDL 43 06/22/2020 0802   CHOLHDL 3.0 06/22/2020 0802   CHOLHDL 4.2 09/10/2017 0959   VLDL 26 09/10/2017 0959   LDLCALC 62 06/22/2020 0802    CBC    Component Value Date/Time   WBC 5.8 06/07/2020 1040   WBC  6.6 06/09/2019 1232   RBC 5.10 06/07/2020 1040   RBC 4.97 06/09/2019 1232   HGB 14.6 06/12/2020 1319   HGB 15.2 06/07/2020 1040   HCT 43.0 06/12/2020 1319   HCT 45.1 06/07/2020 1040   PLT 264 06/07/2020 1040   MCV 88 06/07/2020 1040   MCH 29.8 06/07/2020 1040   MCH 30.6 05/14/2016 1418   MCHC 33.7 06/07/2020 1040   MCHC 33.3 06/09/2019 1232   RDW 14.5 06/07/2020 1040   LYMPHSABS 1.5 06/09/2019 1232   LYMPHSABS 1.7 06/26/2016 1623   MONOABS 0.4 06/09/2019 1232   EOSABS 0.0 06/09/2019 1232   EOSABS 0.0 06/26/2016 1623   BASOSABS 0.1 06/09/2019 1232   BASOSABS 0.0 06/26/2016 1623    Lab Results  Component Value Date   HGBA1C 6.0 01/03/2021    Assessment & Plan:  1. Essential hypertension Controlled Counseled on blood pressure goal of less than 130/80, low-sodium, DASH diet, medication compliance, 150 minutes of moderate intensity exercise per week. Discussed medication compliance, adverse effects.  - hydrochlorothiazide (HYDRODIURIL) 25 MG tablet; Take 1 tablet (25 mg total) by mouth daily.  Dispense: 30 tablet; Refill: 6 - losartan (COZAAR) 50 MG tablet; Take 1 tablet (50 mg total) by mouth daily.  Dispense: 30 tablet; Refill: 6 - Basic Metabolic Panel  2. Gastroesophageal reflux disease without esophagitis Stable - omeprazole (PRILOSEC) 20 MG capsule; Take 1 capsule (20 mg total) by mouth daily.  Dispense: 30 capsule; Refill: 3  3. Sciatica of right side Controlled - ibuprofen (ADVIL) 600 MG tablet; Take 1 tablet (600 mg total) by mouth 2 (two) times daily as needed.  Dispense: 60  tablet; Refill: 3  4. Hyperpigmentation Unknown etiology Will check PT and PTT - Ambulatory referral to Dermatology - PT AND PTT - CBC with Differential/Platelet  5. Costochondral chest pain Cardiac work-up so far has been negative Advised to use NSAID or topical Voltaren gel if symptoms occur  6. Screening for colon cancer - Ambulatory referral to Gastroenterology  7.  Prediabetes A1c of 6.0 Counseled on lifestyle modifications to prevent progression to type 2 diabetes mellitus - POCT glycosylated hemoglobin (Hb A1C)  8. Anxiety and depression Uncontrolled Underlying menopausal symptoms could be contributing Given sedation with Effexor I have switched her to Zoloft after shared decision making - sertraline (ZOLOFT) 50 MG tablet; Take 1 tablet (50 mg total) by mouth daily.  Dispense: 30 tablet; Refill: 6   Meds ordered this encounter  Medications   sertraline (ZOLOFT) 50 MG tablet    Sig: Take 1 tablet (50 mg total) by mouth daily.    Dispense:  30 tablet    Refill:  6    Discontinue effexor   hydrochlorothiazide (HYDRODIURIL) 25 MG tablet    Sig: Take 1 tablet (25 mg total) by mouth daily.    Dispense:  30 tablet    Refill:  6   losartan (COZAAR) 50 MG tablet    Sig: Take 1 tablet (50 mg total) by mouth daily.    Dispense:  30 tablet    Refill:  6   omeprazole (PRILOSEC) 20 MG capsule    Sig: Take 1 capsule (20 mg total) by mouth daily.    Dispense:  30 capsule    Refill:  3   ibuprofen (ADVIL) 600 MG tablet    Sig: Take 1 tablet (600 mg total) by mouth 2 (two) times daily as needed.    Dispense:  60 tablet    Refill:  3    Follow-up: Return in about 6 months (around 07/03/2021) for Chronic medical conditions.       Charlott Rakes, MD, FAAFP. [facility] [place], [place]   01/03/2021, 4:47 PM

## 2021-01-03 NOTE — Patient Instructions (Signed)
Costochondritis Costochondritis is irritation and swelling (inflammation) of the tissue that connects the ribs to the breastbone (sternum). This tissue is called cartilage. Costochondritis causes pain in the front of the chest. Usually, the pain: Starts slowly. Is in more than one rib. What are the causes? The exact cause of this condition is not always known. It results from stress on the tissue in the affected area. The cause of this stress could be: Chest injury. Exercise or activity, such as lifting. Very bad coughing. What increases the risk? You are more likely to develop this condition if you: Are female. Are 50-17 years old. Recently started a new exercise or work activity. Have low levels of vitamin D. Have a condition that makes you cough often. What are the signs or symptoms? The main symptom of this condition is chest pain. The pain: Usually starts slowly and can be sharp or dull. Gets worse with deep breathing, coughing, or exercise. Gets better with rest. May be worse when you press on the affected area of your ribs and breastbone. How is this treated? This condition usually goes away on its own over time. Your doctor may prescribe an NSAID, such as ibuprofen. This can help reduce pain and inflammation. Treatment may also include: Resting and avoiding activities that make pain worse. Putting heat or ice on the painful area. Doing exercises to stretch your chest muscles. If these treatments do not help, your doctor may inject a numbing medicine to help relieve the pain. Follow these instructions at home: Managing pain, stiffness, and swelling   If told, put ice on the painful area. To do this: Put ice in a plastic bag. Place a towel between your skin and the bag. Leave the ice on for 20 minutes, 2-3 times a day. If told, put heat on the affected area. Do this as often as told by your doctor. Use the heat source that your doctor recommends, such as a moist heat pack or  a heating pad. Place a towel between your skin and the heat source. Leave the heat on for 20-30 minutes. Take off the heat if your skin turns bright red. This is very important if you cannot feel pain, heat, or cold. You may have a greater risk of getting burned. Activity Rest as told by your doctor. Do not do anything that makes your pain worse. This includes any activities that use chest, belly (abdomen), and side muscles. Do not lift anything that is heavier than 10 lb (4.5 kg), or the limit that you are told, until your doctor says that it is safe. Return to your normal activities as told by your doctor. Ask your doctor what activities are safe for you. General instructions Take over-the-counter and prescription medicines only as told by your doctor. Keep all follow-up visits as told by your doctor. This is important. Contact a doctor if: You have chills or a fever. Your pain does not go away or it gets worse. You have a cough that does not go away. Get help right away if: You are short of breath. You have very bad chest pain that is not helped by medicines, heat, or ice. These symptoms may be an emergency. Do not wait to see if the symptoms will go away. Get medical help right away. Call your local emergency services (911 in the [place] Do not drive yourself to the hospital. Summary Costochondritis is irritation and swelling (inflammation) of the tissue that connects the ribs to the breastbone (sternum). This condition  causes pain in the front of the chest. Treatment may include medicines, rest, heat or ice, and exercises. This information is not intended to replace advice given to you by your health care provider. Make sure you discuss any questions you have with your health care provider. Document Revised: 12/18/2018 Document Reviewed: 12/18/2018 Elsevier Patient Education  2022 [employer].

## 2021-01-04 LAB — CBC WITH DIFFERENTIAL/PLATELET
Basophils Absolute: 0 10*3/uL (ref 0.0–0.2)
Basos: 0 %
EOS (ABSOLUTE): 0 10*3/uL (ref 0.0–0.4)
Eos: 1 %
Hematocrit: 46.5 % (ref 34.0–46.6)
Hemoglobin: 15.7 g/dL (ref 11.1–15.9)
Immature Grans (Abs): 0 10*3/uL (ref 0.0–0.1)
Immature Granulocytes: 0 %
Lymphocytes Absolute: 1.9 10*3/uL (ref 0.7–3.1)
Lymphs: 26 %
MCH: 29.5 pg (ref 26.6–33.0)
MCHC: 33.8 g/dL (ref 31.5–35.7)
MCV: 87 fL (ref 79–97)
Monocytes Absolute: 0.5 10*3/uL (ref 0.1–0.9)
Monocytes: 7 %
Neutrophils Absolute: 4.8 10*3/uL (ref 1.4–7.0)
Neutrophils: 66 %
Platelets: 297 10*3/uL (ref 150–450)
RBC: 5.33 x10E6/uL — ABNORMAL HIGH (ref 3.77–5.28)
RDW: 13.8 % (ref 11.7–15.4)
WBC: 7.2 10*3/uL (ref 3.4–10.8)

## 2021-01-04 LAB — PT AND PTT
INR: 1 (ref 0.9–1.2)
Prothrombin Time: 10.5 s (ref 9.1–12.0)
aPTT: 28 s (ref 24–33)

## 2021-01-04 LAB — BASIC METABOLIC PANEL
BUN/Creatinine Ratio: 12 (ref 9–23)
BUN: 12 mg/dL (ref 6–24)
CO2: 25 mmol/L (ref 20–29)
Calcium: 9.6 mg/dL (ref 8.7–10.2)
Chloride: 96 mmol/L (ref 96–106)
Creatinine, Ser: 1.02 mg/dL — ABNORMAL HIGH (ref 0.57–1.00)
Glucose: 93 mg/dL (ref 70–99)
Potassium: 3.3 mmol/L — ABNORMAL LOW (ref 3.5–5.2)
Sodium: 136 mmol/L (ref 134–144)
eGFR: 67 mL/min/{1.73_m2} (ref >59)

## 2021-03-13 ENCOUNTER — Ambulatory Visit: Payer: 59 | Admitting: Family Medicine

## 2021-03-30 ENCOUNTER — Encounter (HOSPITAL_BASED_OUTPATIENT_CLINIC_OR_DEPARTMENT_OTHER): Payer: Self-pay | Admitting: Obstetrics & Gynecology

## 2021-03-31 ENCOUNTER — Other Ambulatory Visit (HOSPITAL_BASED_OUTPATIENT_CLINIC_OR_DEPARTMENT_OTHER): Payer: Self-pay | Admitting: Obstetrics & Gynecology

## 2021-03-31 MED ORDER — PROGESTERONE 200 MG PO CAPS: ORAL_CAPSULE | ORAL | 2 refills | Status: DC

## 2021-03-31 MED ORDER — ESTRADIOL 0.05 MG/24HR TD PTTW: 1.0000 | MEDICATED_PATCH | TRANSDERMAL | 2 refills | Status: DC

## 2021-04-16 ENCOUNTER — Encounter: Payer: Self-pay | Admitting: Gastroenterology

## 2021-04-23 ENCOUNTER — Other Ambulatory Visit: Payer: Self-pay

## 2021-04-23 ENCOUNTER — Ambulatory Visit (AMBULATORY_SURGERY_CENTER): Payer: 59 | Admitting: *Deleted

## 2021-04-23 VITALS — Ht 67.0 in | Wt 177.0 lb

## 2021-04-23 DIAGNOSIS — Z1211 Encounter for screening for malignant neoplasm of colon: Secondary | ICD-10-CM

## 2021-04-23 MED ORDER — PEG 3350-KCL-NA BICARB-NACL 420 G PO SOLR: 4000.0000 mL | Freq: Once | ORAL | 0 refills | Status: AC

## 2021-04-23 NOTE — Progress Notes (Signed)

## 2021-04-24 ENCOUNTER — Ambulatory Visit (INDEPENDENT_AMBULATORY_CARE_PROVIDER_SITE_OTHER): Payer: 59 | Admitting: Obstetrics & Gynecology

## 2021-04-24 ENCOUNTER — Other Ambulatory Visit (HOSPITAL_COMMUNITY)
Admission: RE | Admit: 2021-04-24 | Discharge: 2021-04-24 | Disposition: A | Discharge status: Home / Self Care | Payer: 59 | Source: Ambulatory Visit | Attending: Obstetrics & Gynecology | Admitting: Obstetrics & Gynecology

## 2021-04-24 ENCOUNTER — Encounter (HOSPITAL_BASED_OUTPATIENT_CLINIC_OR_DEPARTMENT_OTHER): Payer: Self-pay | Admitting: Obstetrics & Gynecology

## 2021-04-24 VITALS — BP 124/99 | HR 94 | Ht 67.0 in | Wt 177.8 lb

## 2021-04-24 DIAGNOSIS — Z124 Encounter for screening for malignant neoplasm of cervix: Secondary | ICD-10-CM | POA: Insufficient documentation

## 2021-04-24 DIAGNOSIS — N951 Menopausal and female climacteric states: Secondary | ICD-10-CM

## 2021-04-24 DIAGNOSIS — Z1231 Encounter for screening mammogram for malignant neoplasm of breast: Secondary | ICD-10-CM

## 2021-04-24 DIAGNOSIS — R6882 Decreased libido: Secondary | ICD-10-CM

## 2021-04-24 DIAGNOSIS — Z01419 Encounter for gynecological examination (general) (routine) without abnormal findings: Secondary | ICD-10-CM

## 2021-04-24 DIAGNOSIS — I10 Essential (primary) hypertension: Secondary | ICD-10-CM

## 2021-04-24 MED ORDER — PROGESTERONE 200 MG PO CAPS: ORAL_CAPSULE | ORAL | 4 refills | Status: DC

## 2021-04-24 MED ORDER — EST ESTROGENS-METHYLTEST 0.625-1.25 MG PO TABS: 1.0000 | ORAL_TABLET | Freq: Every day | ORAL | 5 refills | Status: DC

## 2021-04-24 NOTE — Progress Notes (Signed)
51 y.o. G45P2003 Divorced Black or [place] American female here for annual exam.  Denies vaginal bleeding.  Hot flashes are better but still has some at night.  Vaginal dryness still present.  Reports libido is low.  Would like this to improved.  Has done a full cardiac work up.  Coronary CT with score of zero.  Had cardiac cath as well due to symptoms.  Significant other is with pt today as well.  Does have a little spotting after progesterone for a day or two.  Nothing heavy and always after progesterone days 1-15 of the month. ? ?No LMP recorded. Patient has had an ablation.          ?Sexually active: Yes.    ?The current method of family planning is tubal ligation.    ?Smoker:  no ? ?Health Maintenance: ?Pap:  04/22/2018 Negative ?MMG:  07/04/2020 Negative ?Colonoscopy:  scheduled 05/08/2021 ?Screening Labs: does with Dr. Margarita Rana ? ? reports that she has never smoked. She has never been exposed to tobacco smoke. She has never used smokeless tobacco. She reports current alcohol use. She reports that she does not use drugs. ? ?Past Medical History:  ?Diagnosis Date  ? Allergy   ? SEASONAL  ? Anxiety   ? Arthritis   ? Depression   ? Disc degeneration, lumbar 2011  ? Heartburn   ? Hyperlipidemia   ? Hypertension   ? IBS (irritable bowel syndrome)   ? ? ?Past Surgical History:  ?Procedure Laterality Date  ? RIGHT HEART CATH N/A 06/12/2020  ? Procedure: RIGHT HEART CATH;  Surgeon: Jolaine Artist, MD;  Location: [facility];  Service: Cardiovascular;  Laterality: N/A;  ? TUBAL LIGATION    ? ? ?Current Outpatient Medications  ?Medication Sig Dispense Refill  ? cyclobenzaprine (FLEXERIL) 10 MG tablet Take 1 tablet (10 mg total) by mouth 2 (two) times daily as needed for muscle spasms. 60 tablet 2  ? ezetimibe (ZETIA) 10 MG tablet Take 10 mg by mouth daily.    ? gabapentin (NEURONTIN) 100 MG capsule Take up to 3 capsules by mouth nightly. (Patient taking differently: as needed. Take up to 3 capsules by mouth  nightly.) 90 capsule 0  ? hydrochlorothiazide (HYDRODIURIL) 25 MG tablet Take 1 tablet (25 mg total) by mouth daily. 30 tablet 6  ? ibuprofen (ADVIL) 600 MG tablet Take 1 tablet (600 mg total) by mouth 2 (two) times daily as needed. 60 tablet 3  ? losartan (COZAAR) 50 MG tablet Take 1 tablet (50 mg total) by mouth daily. 30 tablet 6  ? NONFORMULARY OR COMPOUNDED ITEM Vitamin E vaginal cream 200u/ml.  One ml pv three times weekly. 36 each 3  ? omeprazole (PRILOSEC) 20 MG capsule Take 1 capsule (20 mg total) by mouth daily. (Patient taking differently: Take 20 mg by mouth as needed.) 30 capsule 3  ? OVER THE COUNTER MEDICATION daily. Pre-vitalize supplement 2 capsules daily    ? OVER THE COUNTER MEDICATION daily. Pro-vitalize 2 capsules daily    ? Polyethyl Glycol-Propyl Glycol (SYSTANE) 0.4-0.3 % SOLN Place 1 drop into both eyes daily as needed (Dry eye).    ? progesterone (PROMETRIUM) 200 MG capsule Take days 1-15 each month 45 capsule 4  ? rosuvastatin (CRESTOR) 20 MG tablet Take 20 mg by mouth daily.    ? SODIUM FLUORIDE 5000 PPM 1.1 % PSTE See admin instructions.    ? triamcinolone (NASACORT) 55 MCG/ACT AERO nasal inhaler Place 2 sprays into the nose  daily. (Patient taking differently: Place 2 sprays into the nose daily as needed (Allergies).) 1 Inhaler 0  ? estrogen-methylTESTOSTERone 0.625-1.25 MG tablet Take 1 tablet by mouth daily. 30 tablet 5  ? ezetimibe (ZETIA) 10 MG tablet Take 1 tablet (10 mg total) by mouth daily. (Patient taking differently: Take 10 mg by mouth at bedtime.) 30 tablet 11  ? nitroGLYCERIN (NITROSTAT) 0.4 MG SL tablet Place 1 tablet (0.4 mg total) under the tongue every 5 (five) minutes as needed for chest pain. (Patient not taking: Reported on 04/24/2021) 25 tablet 3  ? ondansetron (ZOFRAN ODT) 4 MG disintegrating tablet Take 1 tablet (4 mg total) by mouth every 8 (eight) hours as needed for nausea or vomiting. (Patient not taking: Reported on 01/03/2021) 20 tablet 0  ? rosuvastatin  (CRESTOR) 20 MG tablet Take 1 tablet (20 mg total) by mouth daily. (Patient taking differently: Take 20 mg by mouth at bedtime.) 30 tablet 11  ? ?No current facility-administered medications for this visit.  ? ? ?Family History  ?Problem Relation Age of Onset  ? Breast cancer Mother 49  ?     ER+  ? Thyroid disease Mother   ? Hyperlipidemia Mother   ? Diabetes Father   ? Hypertension Father   ?     and aunt and uncles  ? Thyroid disease Father   ? Hyperlipidemia Father   ? Breast cancer Maternal Aunt 47  ? Hypertension Paternal Grandfather   ? Prostate cancer Paternal Grandfather   ? Stomach cancer Cousin   ? Colon cancer Neg Hx   ? Colon polyps Neg Hx   ? Esophageal cancer Neg Hx   ? Rectal cancer Neg Hx   ? ? ?Review of Systems  ?Constitutional: Negative.   ?Genitourinary:   ?     Vaginal dryness ?Hot flashes ?Decrease libido  ? ?Exam:   ?BP (!) 124/99 (BP Location: Right Arm, Patient Position: Sitting, Cuff Size: Large)   Pulse 94   Ht '5\' 7"'$  (1.702 m) Comment: Reported  Wt 177 lb 12.8 oz (80.6 kg)   BMI 27.85 kg/m?   Height: '5\' 7"'$  (170.2 cm) (Reported) ? ?General appearance: alert, cooperative and appears stated age ?Head: Normocephalic, without obvious abnormality, atraumatic ?Neck: no adenopathy, supple, symmetrical, trachea midline and thyroid normal to inspection and palpation ?Lungs: clear to auscultation bilaterally ?Breasts: normal appearance, no masses or tenderness ?Heart: regular rate and rhythm ?Abdomen: soft, non-tender; bowel sounds normal; no masses,  no organomegaly ?Extremities: extremities normal, atraumatic, no cyanosis or edema ?Skin: Skin color, texture, turgor normal. No rashes or lesions ?Lymph nodes: Cervical, supraclavicular, and axillary nodes normal. ?No abnormal inguinal nodes palpated ?Neurologic: Grossly normal ? ? ?Pelvic: External genitalia:  no lesions ?             Urethra:  normal appearing urethra with no masses, tenderness or lesions ?             Bartholins and Skenes:  normal    ?             Vagina: normal appearing vagina with normal color and no discharge, no lesions ?             Cervix: no lesions ?             Pap taken: Yes.   ?Bimanual Exam:  Uterus:  normal size, contour, position, consistency, mobility, non-tender ?             Adnexa: normal adnexa and  no mass, fullness, tenderness ?              Rectovaginal: Confirms ?              Anus:  normal sphincter tone, no lesions ? ?Chaperone, Octaviano Batty, CMA, was present for exam. ? ?Assessment/Plan: ?1. Well woman exam with routine gynecological exam ?- Pap and HR HPV obtained today ?- MMG 06/2020.  Order placed for this year ?- colonoscopy 04/2021 ?- Plan BMD around age 75 ?- lab work done with Dr. Margarita Rana ? ?2. Encounter for screening mammogram for malignant neoplasm of breast ?- MM 3D SCREEN BREAST BILATERAL; Future ? ?3. Cervical cancer screening ?- Cytology - PAP( [place]) ? ?4. Low libido ?- will add testosterone to HRT she is using and switch to estratest generic ? ?5. Vasomotor symptoms due to menopause ?- rx for estratest HS to pharmacy as well as progesterone '200mg'$  days 1-15 each month ?- risks and benefits reviewed.  Pt desires to continue.  Feel given negative cardiac evaluation, this is safe as well ? ?6. Essential hypertension ? ? ? ?

## 2021-04-25 ENCOUNTER — Encounter (HOSPITAL_BASED_OUTPATIENT_CLINIC_OR_DEPARTMENT_OTHER): Payer: Self-pay | Admitting: Obstetrics & Gynecology

## 2021-04-25 MED ORDER — EST ESTROGENS-METHYLTEST 0.625-1.25 MG PO TABS: 1.0000 | ORAL_TABLET | Freq: Every day | ORAL | 5 refills | Status: DC

## 2021-04-26 LAB — CYTOLOGY - PAP
Comment: NEGATIVE
Diagnosis: NEGATIVE
High risk HPV: NEGATIVE

## 2021-05-02 ENCOUNTER — Encounter: Payer: Self-pay | Admitting: Gastroenterology

## 2021-05-08 ENCOUNTER — Encounter: Payer: Self-pay | Admitting: Gastroenterology

## 2021-05-08 ENCOUNTER — Other Ambulatory Visit: Payer: Self-pay

## 2021-05-08 ENCOUNTER — Ambulatory Visit (AMBULATORY_SURGERY_CENTER): Payer: 59 | Admitting: Gastroenterology

## 2021-05-08 VITALS — BP 126/89 | HR 82 | Temp 97.1°F | Resp 11 | Ht 67.0 in | Wt 177.0 lb

## 2021-05-08 DIAGNOSIS — Z1211 Encounter for screening for malignant neoplasm of colon: Secondary | ICD-10-CM | POA: Diagnosis present

## 2021-05-08 MED ORDER — SODIUM CHLORIDE 0.9 % IV SOLN: 500.0000 mL | Freq: Once | INTRAVENOUS | Status: DC

## 2021-05-08 NOTE — Progress Notes (Signed)
Report given to PACU, vss 

## 2021-05-08 NOTE — Progress Notes (Signed)
[facility] History and Physical ? ? ?Primary Care Physician:  Charlott Rakes, MD ? ? ?Reason for Procedure:  Colorectal cancer screening ? ?Plan:    Screening colonoscopy with possible interventions as needed ? ? ? ? ?HPI: Brandy [name] is a very pleasant 51 y.o. female here for screening colonoscopy. ?Denies any nausea, vomiting, abdominal pain, melena or bright red blood per rectum ? ?The risks and benefits as well as alternatives of endoscopic procedure(s) have been discussed and reviewed. All questions answered. The patient agrees to proceed. ? ? ? ?Past Medical History:  ?Diagnosis Date  ? Allergy   ? SEASONAL  ? Anxiety   ? Arthritis   ? Depression   ? Disc degeneration, lumbar 2011  ? Heartburn   ? Hyperlipidemia   ? Hypertension   ? IBS (irritable bowel syndrome)   ? ? ?Past Surgical History:  ?Procedure Laterality Date  ? RIGHT HEART CATH N/A 06/12/2020  ? Procedure: RIGHT HEART CATH;  Surgeon: Jolaine Artist, MD;  Location: [facility];  Service: Cardiovascular;  Laterality: N/A;  ? TUBAL LIGATION    ? ? ?Prior to Admission medications   ?Medication Sig Start Date End Date Taking? Authorizing Provider  ?estrogen-methylTESTOSTERone 0.625-1.25 MG tablet Take 1 tablet by mouth daily. 04/25/21  Yes Megan Salon, MD  ?ezetimibe (ZETIA) 10 MG tablet Take 10 mg by mouth daily.   Yes [provider]  ?hydrochlorothiazide (HYDRODIURIL) 25 MG tablet Take 1 tablet (25 mg total) by mouth daily. 01/03/21  Yes Charlott Rakes, MD  ?losartan (COZAAR) 50 MG tablet Take 1 tablet (50 mg total) by mouth daily. 01/03/21  Yes Charlott Rakes, MD  ?omeprazole (PRILOSEC) 20 MG capsule Take 1 capsule (20 mg total) by mouth daily. ?Patient taking differently: Take 20 mg by mouth as needed. 01/03/21  Yes Charlott Rakes, MD  ?Polyethyl Glycol-Propyl Glycol (SYSTANE) 0.4-0.3 % SOLN Place 1 drop into both eyes daily as needed (Dry eye).   Yes [provider]  ?progesterone  (PROMETRIUM) 200 MG capsule Take days 1-15 each month 04/24/21  Yes Megan Salon, MD  ?rosuvastatin (CRESTOR) 20 MG tablet Take 20 mg by mouth daily.   Yes [provider]  ?triamcinolone (NASACORT) 55 MCG/ACT AERO nasal inhaler Place 2 sprays into the nose daily. ?Patient taking differently: Place 2 sprays into the nose daily as needed (Allergies). 04/09/19  Yes Raylene Everts, MD  ?cyclobenzaprine (FLEXERIL) 10 MG tablet Take 1 tablet (10 mg total) by mouth 2 (two) times daily as needed for muscle spasms. 02/01/20   Charlott Rakes, MD  ?ezetimibe (ZETIA) 10 MG tablet Take 1 tablet (10 mg total) by mouth daily. ?Patient taking differently: Take 10 mg by mouth at bedtime. 05/11/20 01/01/21  Werner Lean, MD  ?gabapentin (NEURONTIN) 100 MG capsule Take up to 3 capsules by mouth nightly. ?Patient taking differently: as needed. Take up to 3 capsules by mouth nightly. 11/06/20   Megan Salon, MD  ?ibuprofen (ADVIL) 600 MG tablet Take 1 tablet (600 mg total) by mouth 2 (two) times daily as needed. 01/03/21   Charlott Rakes, MD  ?nitroGLYCERIN (NITROSTAT) 0.4 MG SL tablet Place 1 tablet (0.4 mg total) under the tongue every 5 (five) minutes as needed for chest pain. ?Patient not taking: Reported on 04/24/2021 02/22/20   Werner Lean, MD  ?NONFORMULARY OR COMPOUNDED ITEM Vitamin E vaginal cream 200u/ml.  One ml pv three times weekly. 07/06/20   Megan Salon, MD  ?  ondansetron (ZOFRAN ODT) 4 MG disintegrating tablet Take 1 tablet (4 mg total) by mouth every 8 (eight) hours as needed for nausea or vomiting. ?Patient not taking: Reported on 01/03/2021 05/27/19   Mayers, Loraine Grip, PA-C  ?OVER THE COUNTER MEDICATION daily. Pre-vitalize supplement 2 capsules daily    [provider]  ?OVER THE COUNTER MEDICATION daily. Pro-vitalize 2 capsules daily    [provider]  ?rosuvastatin (CRESTOR) 20 MG tablet Take 1 tablet (20 mg total) by mouth daily. ?Patient taking differently: Take 20  mg by mouth at bedtime. 05/11/20 12/07/20  Rudean Haskell A, MD  ?SODIUM FLUORIDE 5000 PPM 1.1 % PSTE See admin instructions. 04/09/21   [provider]  ? ? ?Current Outpatient Medications  ?Medication Sig Dispense Refill  ? estrogen-methylTESTOSTERone 0.625-1.25 MG tablet Take 1 tablet by mouth daily. 30 tablet 5  ? ezetimibe (ZETIA) 10 MG tablet Take 10 mg by mouth daily.    ? hydrochlorothiazide (HYDRODIURIL) 25 MG tablet Take 1 tablet (25 mg total) by mouth daily. 30 tablet 6  ? losartan (COZAAR) 50 MG tablet Take 1 tablet (50 mg total) by mouth daily. 30 tablet 6  ? omeprazole (PRILOSEC) 20 MG capsule Take 1 capsule (20 mg total) by mouth daily. (Patient taking differently: Take 20 mg by mouth as needed.) 30 capsule 3  ? Polyethyl Glycol-Propyl Glycol (SYSTANE) 0.4-0.3 % SOLN Place 1 drop into both eyes daily as needed (Dry eye).    ? progesterone (PROMETRIUM) 200 MG capsule Take days 1-15 each month 45 capsule 4  ? rosuvastatin (CRESTOR) 20 MG tablet Take 20 mg by mouth daily.    ? triamcinolone (NASACORT) 55 MCG/ACT AERO nasal inhaler Place 2 sprays into the nose daily. (Patient taking differently: Place 2 sprays into the nose daily as needed (Allergies).) 1 Inhaler 0  ? cyclobenzaprine (FLEXERIL) 10 MG tablet Take 1 tablet (10 mg total) by mouth 2 (two) times daily as needed for muscle spasms. 60 tablet 2  ? ezetimibe (ZETIA) 10 MG tablet Take 1 tablet (10 mg total) by mouth daily. (Patient taking differently: Take 10 mg by mouth at bedtime.) 30 tablet 11  ? gabapentin (NEURONTIN) 100 MG capsule Take up to 3 capsules by mouth nightly. (Patient taking differently: as needed. Take up to 3 capsules by mouth nightly.) 90 capsule 0  ? ibuprofen (ADVIL) 600 MG tablet Take 1 tablet (600 mg total) by mouth 2 (two) times daily as needed. 60 tablet 3  ? nitroGLYCERIN (NITROSTAT) 0.4 MG SL tablet Place 1 tablet (0.4 mg total) under the tongue every 5 (five) minutes as needed for chest pain. (Patient not  taking: Reported on 04/24/2021) 25 tablet 3  ? NONFORMULARY OR COMPOUNDED ITEM Vitamin E vaginal cream 200u/ml.  One ml pv three times weekly. 36 each 3  ? ondansetron (ZOFRAN ODT) 4 MG disintegrating tablet Take 1 tablet (4 mg total) by mouth every 8 (eight) hours as needed for nausea or vomiting. (Patient not taking: Reported on 01/03/2021) 20 tablet 0  ? OVER THE COUNTER MEDICATION daily. Pre-vitalize supplement 2 capsules daily    ? OVER THE COUNTER MEDICATION daily. Pro-vitalize 2 capsules daily    ? rosuvastatin (CRESTOR) 20 MG tablet Take 1 tablet (20 mg total) by mouth daily. (Patient taking differently: Take 20 mg by mouth at bedtime.) 30 tablet 11  ? SODIUM FLUORIDE 5000 PPM 1.1 % PSTE See admin instructions.    ? ?Current Facility-Administered Medications  ?Medication Dose Route Frequency Provider Last Rate  Last Admin  ? 0.9 %  sodium chloride infusion  500 mL Intravenous Once Caeleigh Prohaska, Venia Minks, MD      ? ? ?Allergies as of 05/08/2021 - Review Complete 05/08/2021  ?Allergen Reaction Noted  ? Apple juice Hives 06/08/2020  ? ? ?Family History  ?Problem Relation Age of Onset  ? Breast cancer Mother 36  ?     ER+  ? Thyroid disease Mother   ? Hyperlipidemia Mother   ? Diabetes Father   ? Hypertension Father   ?     and aunt and uncles  ? Thyroid disease Father   ? Hyperlipidemia Father   ? Breast cancer Maternal Aunt 60  ? Hypertension Paternal Grandfather   ? Prostate cancer Paternal Grandfather   ? Stomach cancer Cousin   ? Colon cancer Neg Hx   ? Colon polyps Neg Hx   ? Esophageal cancer Neg Hx   ? Rectal cancer Neg Hx   ? ? ?Social History  ? ?Socioeconomic History  ? Marital status: Divorced  ?  Spouse name: Not on file  ? Number of children: 3  ? Years of education: Not on file  ? Highest education level: 12th grade  ?Occupational History  ? Occupation: Chartered certified accountant  ?Tobacco Use  ? Smoking status: Never  ?  Passive exposure: Never  ? Smokeless tobacco: Never  ?Vaping Use  ? Vaping Use: Never used   ?Substance and Sexual Activity  ? Alcohol use: Yes  ?  Comment: social  ? Drug use: No  ? Sexual activity: Yes  ?  Partners: Male  ?  Birth control/protection: Surgical, None  ?  Comment: BTL  ?Other Topics Con

## 2021-05-08 NOTE — Op Note (Signed)
[place] ?Patient Name: Brandy [name] ?Procedure Date: 05/08/2021 4:14 PM ?MRN: [id number] ?Endoscopist: Mauri Pole , MD ?Age: 51 ?Referring MD:  ?Date of Birth: 01-29-1971 ?Gender: Female ?Account #: [id number] ?Procedure:                Colonoscopy ?Indications:              Screening for colorectal malignant neoplasm ?Medicines:                Monitored Anesthesia Care ?Procedure:                Pre-Anesthesia Assessment: ?                          - Prior to the procedure, a History and Physical  ?                          was performed, and patient medications and  ?                          allergies were reviewed. The patient's tolerance of  ?                          previous anesthesia was also reviewed. The risks  ?                          and benefits of the procedure and the sedation  ?                          options and risks were discussed with the patient.  ?                          All questions were answered, and informed consent  ?                          was obtained. Prior Anticoagulants: The patient has  ?                          taken no previous anticoagulant or antiplatelet  ?                          agents. ASA Grade Assessment: II - A patient with  ?                          mild systemic disease. After reviewing the risks  ?                          and benefits, the patient was deemed in  ?                          satisfactory condition to undergo the procedure. ?                          After obtaining informed consent, the colonoscope  ?  was passed under direct vision. Throughout the  ?                          procedure, the patient's blood pressure, pulse, and  ?                          oxygen saturations were monitored continuously. The  ?                          [id number]-HQ190L Colonoscope was introduced through the  ?                          anus and advanced to the the cecum, identified by  ?                           appendiceal orifice and ileocecal valve. The  ?                          colonoscopy was performed without difficulty. The  ?                          patient tolerated the procedure well. The quality  ?                          of the bowel preparation was excellent. The  ?                          ileocecal valve, appendiceal orifice, and rectum  ?                          were photographed. ?Scope In: 4:18:49 PM ?Scope Out: 4:33:14 PM ?Scope Withdrawal Time: 0 hours 8 minutes 5 seconds  ?Total Procedure Duration: 0 hours 14 minutes 25 seconds  ?Findings:                 The perianal and digital rectal examinations were  ?                          normal. ?                          A single localized non-bleeding erosion was found  ?                          in the sigmoid colon. ?                          Non-bleeding external and internal hemorrhoids were  ?                          found during retroflexion. The hemorrhoids were  ?                          small. ?  The exam was otherwise without abnormality. ?Complications:            No immediate complications. ?Estimated Blood Loss:     Estimated blood loss was minimal. ?Impression:               - A single erosion in the sigmoid colon. ?                          - Non-bleeding external and internal hemorrhoids. ?                          - The examination was otherwise normal. ?                          - No specimens collected. ?Recommendation:           - Patient has a contact number available for  ?                          emergencies. The signs and symptoms of potential  ?                          delayed complications were discussed with the  ?                          patient. Return to normal activities tomorrow.  ?                          Written discharge instructions were provided to the  ?                          patient. ?                          - Resume previous diet. ?                          - Continue present  medications. ?                          - Repeat colonoscopy in 10 years for screening  ?                          purposes. ?                          - Avoid high dose aspirin, ibuprofen, naproxen, or  ?                          other non-steroidal anti-inflammatory drugs. ?Mauri Pole, MD ?05/08/2021 4:39:48 PM ?This report has been signed electronically. ?

## 2021-05-08 NOTE — Progress Notes (Signed)
VS by CW  Pt's states no medical or surgical changes since previsit or office visit.  

## 2021-05-08 NOTE — Patient Instructions (Signed)
Handout provided on hemorrhoids.  ? ?Avoid high dose aspirin, ibuprofen, naproxen, or other non-steroidal anti-inflammatory drugs (NSAIDS).  ? ?YOU HAD AN ENDOSCOPIC PROCEDURE TODAY AT [facility]:   Refer to the procedure report that was given to you for any specific questions about what was found during the examination.  If the procedure report does not answer your questions, please call your gastroenterologist to clarify.  If you requested that your care partner not be given the details of your procedure findings, then the procedure report has been included in a sealed envelope for you to review at your convenience later. ? ?YOU SHOULD EXPECT: Some feelings of bloating in the abdomen. Passage of more gas than usual.  Walking can help get rid of the air that was put into your GI tract during the procedure and reduce the bloating. If you had a lower endoscopy (such as a colonoscopy or flexible sigmoidoscopy) you may notice spotting of blood in your stool or on the toilet paper. If you underwent a bowel prep for your procedure, you may not have a normal bowel movement for a few days. ? ?Please Note:  You might notice some irritation and congestion in your nose or some drainage.  This is from the oxygen used during your procedure.  There is no need for concern and it should clear up in a day or so. ? ?SYMPTOMS TO REPORT IMMEDIATELY: ? ?Following lower endoscopy (colonoscopy or flexible sigmoidoscopy): ? Excessive amounts of blood in the stool ? Significant tenderness or worsening of abdominal pains ? Swelling of the abdomen that is new, acute ? Fever of 100?F or higher ? ?For urgent or emergent issues, a gastroenterologist can be reached at any hour by calling [phone]. ?Do not use MyChart messaging for urgent concerns.  ? ? ?DIET:  We do recommend a small meal at first, but then you may proceed to your regular diet.  Drink plenty of fluids but you should avoid alcoholic beverages for 24  hours. ? ?ACTIVITY:  You should plan to take it easy for the rest of today and you should NOT DRIVE or use heavy machinery until tomorrow (because of the sedation medicines used during the test).   ? ?FOLLOW UP: ?Our staff will call the number listed on your records 48-72 hours following your procedure to check on you and address any questions or concerns that you may have regarding the information given to you following your procedure. If we do not reach you, we will leave a message.  We will attempt to reach you two times.  During this call, we will ask if you have developed any symptoms of COVID 19. If you develop any symptoms (ie: fever, flu-like symptoms, shortness of breath, cough etc.) before then, please call [phone].  If you test positive for Covid 19 in the 2 weeks post procedure, please call and report this information to [place].   ? ?If any biopsies were taken you will be contacted by phone or by letter within the next 1-3 weeks.  Please call us at [phone] if you have not heard about the biopsies in 3 weeks.  ? ? ?SIGNATURES/CONFIDENTIALITY: ?You and/or your care partner have signed paperwork which will be entered into your electronic medical record.  These signatures attest to the fact that that the information above on your After Visit Summary has been reviewed and is understood.  Full responsibility of the confidentiality of this discharge information lies with you and/or your care-partner. ? ?

## 2021-05-10 ENCOUNTER — Telehealth: Payer: Self-pay

## 2021-05-10 NOTE — Telephone Encounter (Signed)
Left VM

## 2021-05-14 ENCOUNTER — Other Ambulatory Visit: Payer: Self-pay | Admitting: Family Medicine

## 2021-05-14 DIAGNOSIS — M5431 Sciatica, right side: Secondary | ICD-10-CM

## 2021-05-15 ENCOUNTER — Other Ambulatory Visit: Payer: Self-pay | Admitting: Internal Medicine

## 2021-05-15 NOTE — Telephone Encounter (Signed)
Requested Prescriptions  ?Pending Prescriptions Disp Refills  ?? ibuprofen (ADVIL) 600 MG tablet [Pharmacy Med Name: IBUPROFEN 600 MG TABLET] 60 tablet 3  ?  Sig: TAKE 1 TABLET (600 MG TOTAL) BY MOUTH 2 (TWO) TIMES DAILY AS NEEDED.  ?  ? Analgesics:  NSAIDS Failed - 05/14/2021  2:02 AM  ?  ?  Failed - Manual Review: Labs are only required if the patient has taken medication for more than 8 weeks.  ?  ?  Failed - Cr in normal range and within 360 days  ?  Creatinine, Ser  ?Date Value Ref Range Status  ?01/03/2021 1.02 (H) 0.57 - 1.00 mg/dL Final  ?   ?  ?  Passed - HGB in normal range and within 360 days  ?  Hemoglobin  ?Date Value Ref Range Status  ?01/03/2021 15.7 11.1 - 15.9 g/dL Final  ?   ?  ?  Passed - PLT in normal range and within 360 days  ?  Platelets  ?Date Value Ref Range Status  ?01/03/2021 297 150 - 450 x10E3/uL Final  ?   ?  ?  Passed - HCT in normal range and within 360 days  ?  Hematocrit  ?Date Value Ref Range Status  ?01/03/2021 46.5 34.0 - 46.6 % Final  ?   ?  ?  Passed - eGFR is 30 or above and within 360 days  ?  GFR calc Af Amer  ?Date Value Ref Range Status  ?02/22/2020 92 >59 mL/min/1.73 Final  ?  Comment:  ?  **In accordance with recommendations from the NKF-ASN Task force,** ?  [facility] is in the process of updating its eGFR calculation to the ?  2021 CKD-EPI creatinine equation that estimates kidney function ?  without a race variable. ?  ? ?GFR calc non Af Amer  ?Date Value Ref Range Status  ?02/22/2020 80 >59 mL/min/1.73 Final  ? ?eGFR  ?Date Value Ref Range Status  ?01/03/2021 67 >59 mL/min/1.73 Final  ?   ?  ?  Passed - Patient is not pregnant  ?  ?  Passed - Valid encounter within last 12 months  ?  Recent Outpatient Visits   ?      ? 4 months ago Hyperpigmentation  ? [place], Charlane Ferretti, MD  ? 10 months ago Anxiety and depression  ? [place], MD  ? 1 year ago Angina pectoris [facility])  ? [place], Charlane Ferretti, MD  ? 1 year ago Nausea and vomiting, intractability of vomiting not specified, unspecified vomiting type  ? [place], Charlane Ferretti, MD  ? 1 year ago Irritable bowel syndrome with diarrhea  ? [place], [place], [place]  ?  ?  ?Future Appointments   ?        ? In 1 month Charlott Rakes, MD [place]  ? In 1 month [place], Ronalee Red, [place] [facility]  ?  ? ?  ?  ?  ? ? ?

## 2021-05-25 ENCOUNTER — Other Ambulatory Visit: Payer: Self-pay | Admitting: Internal Medicine

## 2021-06-11 ENCOUNTER — Other Ambulatory Visit: Payer: Self-pay | Admitting: Internal Medicine

## 2021-06-27 ENCOUNTER — Other Ambulatory Visit: Payer: Self-pay

## 2021-07-03 ENCOUNTER — Ambulatory Visit: Payer: 59 | Attending: Family Medicine | Admitting: Family Medicine

## 2021-07-03 ENCOUNTER — Other Ambulatory Visit: Payer: Self-pay | Admitting: Internal Medicine

## 2021-07-03 ENCOUNTER — Encounter: Payer: Self-pay | Admitting: Family Medicine

## 2021-07-03 VITALS — BP 115/82 | HR 83 | Ht 67.0 in | Wt 175.2 lb

## 2021-07-03 DIAGNOSIS — I10 Essential (primary) hypertension: Secondary | ICD-10-CM | POA: Diagnosis not present

## 2021-07-03 DIAGNOSIS — M47896 Other spondylosis, lumbar region: Secondary | ICD-10-CM

## 2021-07-03 DIAGNOSIS — R7303 Prediabetes: Secondary | ICD-10-CM

## 2021-07-03 DIAGNOSIS — Z23 Encounter for immunization: Secondary | ICD-10-CM

## 2021-07-03 DIAGNOSIS — K219 Gastro-esophageal reflux disease without esophagitis: Secondary | ICD-10-CM

## 2021-07-03 DIAGNOSIS — M25562 Pain in left knee: Secondary | ICD-10-CM

## 2021-07-03 DIAGNOSIS — E876 Hypokalemia: Secondary | ICD-10-CM | POA: Diagnosis not present

## 2021-07-03 LAB — POCT GLYCOSYLATED HEMOGLOBIN (HGB A1C): HbA1c, POC (controlled diabetic range): 5.9 % (ref 0.0–7.0)

## 2021-07-03 MED ORDER — HYDROCHLOROTHIAZIDE 25 MG PO TABS: 25.0000 mg | ORAL_TABLET | Freq: Every day | ORAL | 1 refills | Status: DC

## 2021-07-03 MED ORDER — OMEPRAZOLE 20 MG PO CPDR: 20.0000 mg | DELAYED_RELEASE_CAPSULE | ORAL | 0 refills | Status: DC | PRN

## 2021-07-03 MED ORDER — LOSARTAN POTASSIUM 50 MG PO TABS: 50.0000 mg | ORAL_TABLET | Freq: Every day | ORAL | 1 refills | Status: DC

## 2021-07-03 NOTE — Patient Instructions (Signed)
[facility] [address], [place], [place] [address] [phone] or [phone] Walk-in urgent care 24/7 for anyone  For [place] Residents ONLY New patient assessments and therapy walk-ins: Monday and Wednesday 8am-11am First and second Friday 1pm-5pm New patient psychiatry and medication management walk-ins: Mondays, Wednesdays, Thursdays, Fridays 8am-11am No psychiatry walk-ins on Tuesday   *Accepts all insurance and uninsured for Urgent Care needs *Accepts Medicaid and uninsured for outpatient treatment   [facility] (Therapy and psychiatry) [facility] (near [employer] [facility]) [address], [address] [place], [place] [address] [phone] Fax: [phone] (INSURANCE REQUIRED-MEDICAID ACCEPTED)   [facility] [place] [address] [place],  [place]  [address] [phone] Call for appointment  [facility] (Therapy only)  [facility] [address], [place], [place] [address] Monday - Friday: 8:30 a.m.-12 p.m. / 1 p.m.-2:30 p.m.  [facility] [address], [place], [place] [address] Monday-Friday: 8:30 a.m.-12 p.m. / 2-3:30 p.m. (INSURANCE REQUIRED -MEDICAID ACCEPTED) They do offer a sliding fee scale $20-$30/session   [facility] [address] [place], [place] [address] Phone: [phone]  [facility] [address] [place] [address]  Phone: [phone] (Does not accept Medicaid) (only one provider accepts Medicare) [facility] [address] ([address] [place], [place] [address] (Accepts Medicaid and Medicare)  [facility]  [address]  [place], [place] [address]  Phone: [phone]  [address] [place], [place] [address] Phone: [phone] (Accepts Medicaid) [facility] (Therapy only)  [address], [place], [place] [address] Phone: [phone]   [facility] (Therapy only)  [address] [place], [place] [address] Phone: [phone] (Accepts Medicaid & Medicare)   [facility] [address], [address] [place], [place] [address] Phone: [phone] (Accepts [place] Health Choice) [facility] [address] [place], [place] [address] Phone: [phone] (Accepts Medicaid) [facility] (Psychiatry only)  [phone] [address], [place], [place] [address] (Accepts Medicaid and Medicare) [facility] (Psychiatry and therapy)  [address], [place], [place] [address] [phone] (Accepts Medicare) [facility] (psychiatry and therapy) [address], [place], [place] [address] [phone]  [facility] [address], [place], [place] [address] [phone] Accept Aetna, BCBS, Cigna, Medcost, Tricare,  and the following types of Medicaid; Alliance, Cardinal, Partners, Vaya, [place] Health Choice, [place] Access Plans, Healthy Blue, [place] Complete, and WellCare, as well as offering a sliding scale and private payment options. Provides In-Office Appointments, Virtual Appointments, and Phone Consultations Offers medication management for ages 4 years old and up, including,  Medication Management for Suboxone and Vivitrol  [facility] [phone] [address], [place], [place] [address] (Accepts Medicaid and Medicare)         19.  [facility] (therapy only)  [address] [place], [place] [address]            [phone] (Accepts medicare) 20. [facility]  (Suboxone and methodone) [phone] [address], [place], [place] [address] To Be Eligible for Opioid Treatment at [facility] you must be at least 51 years of age you have already tried other  interventions that were not successful such as opioid   detox, inpatient rehab for opioids, or outpatient counseling specifically for opioid dependency your [facility] drug test must be completely free of benzodiazepines (klonopin, xanax, valium, ativan, or other benz) you have reliable transportation to the [facility] clinic in [place] you recognize that counseling is a critical component of [facility]' Opioid Program and you agree to attend all required counseling sessions you are committed to total drug abstinence and will conscientiously strive to remain free of alcohol, marijuana, and other illicit substances while in treatment you desire a peaceful treatment atmosphere in which personal responsibility and respect toward staff and clients is the norm   21. [facility] [address], [place], [place] [address] Offers SAIOP (Substance Abuse Intensive Outpatient Program) [phone] 22. [facility] [address] [place], [place] [address] [phone] (Accepts medicare)  For those who are tech savvy, go on psychology today, type in your local city (i.e. [place]) and specify your insurance at the top of the screen after you search. (Medicaid if needed). You can also specify whether you are interested in therapy and psychiatry.  [email or web address].com/us      

## 2021-07-03 NOTE — Progress Notes (Signed)
Left knee pain. ?Lower back pain. ?

## 2021-07-03 NOTE — Progress Notes (Signed)
? ?Subjective:  ?Patient ID: Brandy [name], female    DOB: 04-27-1970  Age: 51 y.o. MRN: [id number] ? ?CC: Prediabetes ? ? ?HPI ?Brandy [name] is a 51 y.o. year old female with a history of hypertension, hyperlipidemia, anxiety and depression here for a follow up visit. ? ?Interval History: ?Currently followed by GYN and is on HRT and testosterone for vasomotor symptoms of menopause and reports improvement in his symptoms. ? ?At her last visit her Effexor for treatment of her depression was discontinued due to complaints of sedation and was substituted with Zoloft.  She subsequently stopped Zoloft as it was ineffective. She is planning on going for psychotherapy. ? ?Last week her knee popped  when she tried to get up and ever since it has been throbbing and she has had a hard time bearing weight on it. ?Her lower back hurts when she does activities and is having to take breaks. She uses Ibuprofen and cyclobenzaprine and this has been effective. ?Doing well on her statin and her antihypertensive. ?Past Medical History:  ?Diagnosis Date  ? Allergy   ? SEASONAL  ? Anxiety   ? Arthritis   ? Depression   ? Disc degeneration, lumbar 2011  ? Heartburn   ? Hyperlipidemia   ? Hypertension   ? IBS (irritable bowel syndrome)   ? ? ?Past Surgical History:  ?Procedure Laterality Date  ? RIGHT HEART CATH N/A 06/12/2020  ? Procedure: RIGHT HEART CATH;  Surgeon: Jolaine Artist, MD;  Location: [facility];  Service: Cardiovascular;  Laterality: N/A;  ? TUBAL LIGATION    ? ? ?Family History  ?Problem Relation Age of Onset  ? Breast cancer Mother 40  ?     ER+  ? Thyroid disease Mother   ? Hyperlipidemia Mother   ? Diabetes Father   ? Hypertension Father   ?     and aunt and uncles  ? Thyroid disease Father   ? Hyperlipidemia Father   ? Breast cancer Maternal Aunt 104  ? Hypertension Paternal Grandfather   ? Prostate cancer Paternal Grandfather   ? Stomach cancer Cousin   ? Colon cancer Neg Hx   ? Colon  polyps Neg Hx   ? Esophageal cancer Neg Hx   ? Rectal cancer Neg Hx   ? ? ?Social History  ? ?Socioeconomic History  ? Marital status: Divorced  ?  Spouse name: Not on file  ? Number of children: 3  ? Years of education: Not on file  ? Highest education level: 12th grade  ?Occupational History  ? Occupation: Chartered certified accountant  ?Tobacco Use  ? Smoking status: Never  ?  Passive exposure: Never  ? Smokeless tobacco: Never  ?Vaping Use  ? Vaping Use: Never used  ?Substance and Sexual Activity  ? Alcohol use: Yes  ?  Comment: social  ? Drug use: No  ? Sexual activity: Yes  ?  Partners: Male  ?  Birth control/protection: Surgical, None  ?  Comment: BTL  ?Other Topics Concern  ? Not on file  ?Social History Narrative  ? Not on file  ? ?Social Determinants of Health  ? ?Financial Resource Strain: Not on file  ?Food Insecurity: Not on file  ?Transportation Needs: Not on file  ?Physical Activity: Not on file  ?Stress: Not on file  ?Social Connections: Not on file  ? ? ?Allergies  ?Allergen Reactions  ? Apple Juice Hives  ?  ANYTHING WITH APPLES IN IT  ? ? ?  Outpatient Medications Prior to Visit  ?Medication Sig Dispense Refill  ? cyclobenzaprine (FLEXERIL) 10 MG tablet Take 1 tablet (10 mg total) by mouth 2 (two) times daily as needed for muscle spasms. 60 tablet 2  ? estrogen-methylTESTOSTERone 0.625-1.25 MG tablet Take 1 tablet by mouth daily. 30 tablet 5  ? ezetimibe (ZETIA) 10 MG tablet TAKE 1 TABLET EVERY DAY AT BEDTIME 15 tablet 0  ? gabapentin (NEURONTIN) 100 MG capsule Take up to 3 capsules by mouth nightly. (Patient taking differently: as needed. Take up to 3 capsules by mouth nightly.) 90 capsule 0  ? ibuprofen (ADVIL) 600 MG tablet TAKE 1 TABLET (600 MG TOTAL) BY MOUTH 2 (TWO) TIMES DAILY AS NEEDED. 60 tablet 3  ? nitroGLYCERIN (NITROSTAT) 0.4 MG SL tablet Place 1 tablet (0.4 mg total) under the tongue every 5 (five) minutes as needed for chest pain. 25 tablet 3  ? NONFORMULARY OR COMPOUNDED ITEM Vitamin E vaginal cream  200u/ml.  One ml pv three times weekly. 36 each 3  ? ondansetron (ZOFRAN ODT) 4 MG disintegrating tablet Take 1 tablet (4 mg total) by mouth every 8 (eight) hours as needed for nausea or vomiting. 20 tablet 0  ? OVER THE COUNTER MEDICATION daily. Pre-vitalize supplement 2 capsules daily    ? OVER THE COUNTER MEDICATION daily. Pro-vitalize 2 capsules daily    ? Polyethyl Glycol-Propyl Glycol (SYSTANE) 0.4-0.3 % SOLN Place 1 drop into both eyes daily as needed (Dry eye).    ? progesterone (PROMETRIUM) 200 MG capsule Take days 1-15 each month 45 capsule 4  ? rosuvastatin (CRESTOR) 20 MG tablet Take 20 mg by mouth daily.    ? rosuvastatin (CRESTOR) 20 MG tablet TAKE 1 TABLET BY MOUTH EVERY DAY AT BEDTIME. Please call and schedule an appt [phone]. Thank you 30 tablet 0  ? SODIUM FLUORIDE 5000 PPM 1.1 % PSTE See admin instructions.    ? triamcinolone (NASACORT) 55 MCG/ACT AERO nasal inhaler Place 2 sprays into the nose daily. (Patient taking differently: Place 2 sprays into the nose daily as needed (Allergies).) 1 Inhaler 0  ? hydrochlorothiazide (HYDRODIURIL) 25 MG tablet Take 1 tablet (25 mg total) by mouth daily. 30 tablet 6  ? losartan (COZAAR) 50 MG tablet Take 1 tablet (50 mg total) by mouth daily. 30 tablet 6  ? omeprazole (PRILOSEC) 20 MG capsule Take 1 capsule (20 mg total) by mouth daily. (Patient taking differently: Take 20 mg by mouth as needed.) 30 capsule 3  ? ?No facility-administered medications prior to visit.  ? ? ? ?ROS ?Review of Systems  ?Constitutional:  Negative for activity change and appetite change.  ?HENT:  Negative for sinus pressure and sore throat.   ?Respiratory:  Negative for chest tightness, shortness of breath and wheezing.   ?Cardiovascular:  Negative for chest pain and palpitations.  ?Gastrointestinal:  Negative for abdominal distention, abdominal pain and constipation.  ?Genitourinary: Negative.   ?Musculoskeletal:   ?     See HPI  ?Psychiatric/Behavioral:  Negative for behavioral  problems and dysphoric mood.   ? ?Objective:  ?BP 115/82   Pulse 83   Ht '5\' 7"'$  (1.702 m)   Wt 175 lb 3.2 oz (79.5 kg)   SpO2 99%   BMI 27.44 kg/m?  ? ? ?  07/03/2021  ?  9:11 AM 05/08/2021  ?  4:57 PM 05/08/2021  ?  4:47 PM  ?BP/Weight  ?Systolic BP [phone]  ?Diastolic BP 82 89 87  ?Wt. (Lbs) 175.2    ?  BMI 27.44 kg/m2    ? ? ? ? ?Physical Exam ?Constitutional:   ?   Appearance: She is well-developed.  ?Cardiovascular:  ?   Rate and Rhythm: Normal rate.  ?   Heart sounds: Normal heart sounds. No murmur heard. ?Pulmonary:  ?   Effort: Pulmonary effort is normal.  ?   Breath sounds: Normal breath sounds. No wheezing or rales.  ?Chest:  ?   Chest wall: No tenderness.  ?Abdominal:  ?   General: Bowel sounds are normal. There is no distension.  ?   Palpations: Abdomen is soft. There is no mass.  ?   Tenderness: There is no abdominal tenderness.  ?Musculoskeletal:     ?   General: Normal range of motion.  ?   Right lower leg: No edema.  ?   Left lower leg: No edema.  ?   Comments: Normal appearance of both knees ?Left-no medial or lateral joint line tenderness.  Tenderness on flexion and extension ?Right-normal ?Back-no tenderness on palpation of lumbar spine; negative straight leg raise bilaterally  ?Neurological:  ?   Mental Status: She is alert and oriented to person, place, and time.  ?Psychiatric:     ?   Mood and Affect: Mood normal.  ? ? ? ?  Latest Ref Rng & Units 01/03/2021  ?  4:25 PM 06/22/2020  ?  8:02 AM 06/12/2020  ?  1:19 PM  ?CMP  ?Glucose 70 - 99 mg/dL 93      ?BUN 6 - 24 mg/dL 12      ?Creatinine 0.57 - 1.00 mg/dL 1.02      ?Sodium 134 - 144 mmol/L 136    145    ?Potassium 3.5 - 5.2 mmol/L 3.3    3.7    ?Chloride 96 - 106 mmol/L 96      ?CO2 20 - 29 mmol/L 25      ?Calcium 8.7 - 10.2 mg/dL 9.6      ?Total Protein 6.0 - 8.5 g/dL  7.3     ?Total Bilirubin 0.0 - 1.2 mg/dL  0.4     ?Alkaline Phos 44 - 121 IU/L  92     ?AST 0 - 40 IU/L  30     ?ALT 0 - 32 IU/L  85     ? ? ?Lipid Panel  ?   ?Component Value  Date/Time  ? CHOL 127 06/22/2020 0802  ? TRIG 123 06/22/2020 0802  ? HDL 43 06/22/2020 0802  ? CHOLHDL 3.0 06/22/2020 0802  ? CHOLHDL 4.2 09/10/2017 0959  ? VLDL 26 09/10/2017 0959  ? LDLCALC 62 06/22/2020 0

## 2021-07-04 LAB — CMP14+EGFR
ALT: 43 IU/L — ABNORMAL HIGH (ref 0–32)
AST: 30 IU/L (ref 0–40)
Albumin/Globulin Ratio: 1.8 (ref 1.2–2.2)
Albumin: 4.8 g/dL (ref 3.8–4.8)
Alkaline Phosphatase: 64 IU/L (ref 44–121)
BUN/Creatinine Ratio: 9 (ref 9–23)
BUN: 9 mg/dL (ref 6–24)
Bilirubin Total: 0.4 mg/dL (ref 0.0–1.2)
CO2: 23 mmol/L (ref 20–29)
Calcium: 9.6 mg/dL (ref 8.7–10.2)
Chloride: 104 mmol/L (ref 96–106)
Creatinine, Ser: 1.02 mg/dL — ABNORMAL HIGH (ref 0.57–1.00)
Globulin, Total: 2.7 g/dL (ref 1.5–4.5)
Glucose: 97 mg/dL (ref 70–99)
Potassium: 3.9 mmol/L (ref 3.5–5.2)
Sodium: 142 mmol/L (ref 134–144)
Total Protein: 7.5 g/dL (ref 6.0–8.5)
eGFR: 67 mL/min/{1.73_m2} (ref >59)

## 2021-07-04 LAB — LP+NON-HDL CHOLESTEROL
Cholesterol, Total: 94 mg/dL — ABNORMAL LOW (ref 100–199)
HDL: 42 mg/dL (ref >39)
LDL Chol Calc (NIH): 37 mg/dL (ref 0–99)
Total Non-HDL-Chol (LDL+VLDL): 52 mg/dL (ref 0–129)
Triglycerides: 67 mg/dL (ref 0–149)
VLDL Cholesterol Cal: 15 mg/dL (ref 5–40)

## 2021-07-05 ENCOUNTER — Ambulatory Visit
Admission: RE | Admit: 2021-07-05 | Discharge: 2021-07-05 | Disposition: A | Discharge status: Home / Self Care | Payer: No Typology Code available for payment source | Source: Ambulatory Visit | Attending: Family Medicine | Admitting: Family Medicine

## 2021-07-05 DIAGNOSIS — M25562 Pain in left knee: Secondary | ICD-10-CM

## 2021-07-10 ENCOUNTER — Encounter: Payer: Self-pay | Admitting: Physical Medicine & Rehabilitation

## 2021-07-11 ENCOUNTER — Ambulatory Visit: Payer: 59 | Admitting: Physician Assistant

## 2021-07-11 DIAGNOSIS — Z1283 Encounter for screening for malignant neoplasm of skin: Secondary | ICD-10-CM

## 2021-07-11 DIAGNOSIS — R21 Rash and other nonspecific skin eruption: Secondary | ICD-10-CM | POA: Diagnosis not present

## 2021-07-11 DIAGNOSIS — D485 Neoplasm of uncertain behavior of skin: Secondary | ICD-10-CM | POA: Diagnosis not present

## 2021-07-11 MED ORDER — BETAMETHASONE DIPROPIONATE 0.05 % EX CREA: TOPICAL_CREAM | Freq: Two times a day (BID) | CUTANEOUS | 11 refills | Status: AC | PRN

## 2021-07-18 ENCOUNTER — Encounter: Payer: Self-pay | Admitting: Physician Assistant

## 2021-07-18 NOTE — Progress Notes (Signed)
   New Patient   Subjective  Brandy [name] is a 51 y.o. female who presents for the following: Skin Problem (Patient has a dark lesion on her chin x months. She says it is getting smaller. Also has discoloration on both legs. Lighter spots. X months. Also check moles around her neck. They bother her. ).   The following portions of the chart were reviewed this encounter and updated as appropriate:  Tobacco  Allergies  Meds  Problems  Med Hx  Surg Hx  Fam Hx      Objective  Well appearing patient in no apparent distress; mood and affect are within normal limits.  A full examination was performed including scalp, head, eyes, ears, nose, lips, neck, chest, axillae, abdomen, back, buttocks, bilateral upper extremities, bilateral lower extremities, hands, feet, fingers, toes, fingernails, and toenails. All findings within normal limits unless otherwise noted below.  Left Lower Back Raised pink xerosis     Left Abdomen (side) - Upper Dark macule            Assessment & Plan  Rash and other nonspecific skin eruption Left Lower Back  betamethasone dipropionate 0.05 % cream - Left Lower Back Apply topically 2 (two) times daily as needed (Rash).  Neoplasm of uncertain behavior of skin Left Abdomen (side) - Upper  Skin / nail biopsy Type of biopsy: tangential   Informed consent: discussed and consent obtained   Timeout: patient name, date of birth, surgical site, and procedure verified   Procedure prep:  Patient was prepped and draped in usual sterile fashion (Non sterile) Prep type:  Chlorhexidine Anesthesia: the lesion was anesthetized in a standard fashion   Anesthetic:  1% lidocaine w/ epinephrine 1-100,000 local infiltration Instrument used: flexible razor blade   Outcome: patient tolerated procedure well   Post-procedure details: wound care instructions given    Specimen 1 - Surgical pathology Differential Diagnosis: mm, hyperpigmented  macule  Check Margins: yes     I, Eloisa Chokshi, PA-C, have reviewed all documentation's for this visit.  The documentation on 07/18/21 for the exam, diagnosis, procedures and orders are all accurate and complete.

## 2021-08-16 ENCOUNTER — Encounter: Payer: 59 | Admitting: Physical Medicine & Rehabilitation

## 2021-10-04 ENCOUNTER — Telehealth (HOSPITAL_BASED_OUTPATIENT_CLINIC_OR_DEPARTMENT_OTHER): Payer: Self-pay

## 2021-10-04 ENCOUNTER — Other Ambulatory Visit (HOSPITAL_BASED_OUTPATIENT_CLINIC_OR_DEPARTMENT_OTHER): Payer: Self-pay

## 2021-10-04 ENCOUNTER — Other Ambulatory Visit (HOSPITAL_BASED_OUTPATIENT_CLINIC_OR_DEPARTMENT_OTHER): Payer: Self-pay | Admitting: Obstetrics & Gynecology

## 2021-10-04 DIAGNOSIS — N951 Menopausal and female climacteric states: Secondary | ICD-10-CM

## 2021-10-04 MED ORDER — GABAPENTIN 100 MG PO CAPS: ORAL_CAPSULE | ORAL | 1 refills | Status: DC

## 2021-10-04 MED ORDER — EST ESTROGENS-METHYLTEST 0.625-1.25 MG PO TABS: 1.0000 | ORAL_TABLET | Freq: Every day | ORAL | 1 refills | Status: DC

## 2021-10-04 MED ORDER — EST ESTROGENS-METHYLTEST 0.625-1.25 MG PO TABS: 1.0000 | ORAL_TABLET | Freq: Every day | ORAL | 5 refills | Status: DC

## 2021-10-04 NOTE — Telephone Encounter (Signed)
Entered in Error

## 2021-10-04 NOTE — Telephone Encounter (Signed)
Called patient to verify how she was taking the Gabapentin '100mg'$ . Patient states that she would sometimes use this medication to help her sleep at night. She is wanting to restart the medication. She has also switched pharmacies. She is now using [facility]. I have resent the estrogen to the correct pharmacy. tbw

## 2021-10-04 NOTE — Telephone Encounter (Signed)
When reviewing the Gabapentin will you also resend the estrogen into the changed pharmacy. Thank you. tbw

## 2021-10-05 ENCOUNTER — Other Ambulatory Visit: Payer: Self-pay

## 2021-10-05 MED ORDER — EZETIMIBE 10 MG PO TABS
ORAL_TABLET | ORAL | 0 refills | Status: DC
Start: 2021-10-05 — End: 2021-10-23

## 2021-10-05 MED ORDER — ROSUVASTATIN CALCIUM 20 MG PO TABS
ORAL_TABLET | ORAL | 0 refills | Status: DC
Start: 2021-10-05 — End: 2021-10-23

## 2021-10-05 NOTE — Addendum Note (Signed)
Addended by: Carter Kitten D on: 10/05/2021 09:59 AM   Modules accepted: Orders

## 2021-10-21 ENCOUNTER — Other Ambulatory Visit: Payer: Self-pay | Admitting: Internal Medicine

## 2021-11-01 ENCOUNTER — Encounter (HOSPITAL_BASED_OUTPATIENT_CLINIC_OR_DEPARTMENT_OTHER): Payer: Self-pay | Admitting: Obstetrics & Gynecology

## 2021-11-09 ENCOUNTER — Ambulatory Visit (HOSPITAL_BASED_OUTPATIENT_CLINIC_OR_DEPARTMENT_OTHER)
Admission: RE | Admit: 2021-11-09 | Discharge: 2021-11-09 | Disposition: A | Discharge status: Home / Self Care | Payer: 59 | Source: Ambulatory Visit | Attending: Obstetrics & Gynecology | Admitting: Obstetrics & Gynecology

## 2021-11-09 ENCOUNTER — Encounter (HOSPITAL_BASED_OUTPATIENT_CLINIC_OR_DEPARTMENT_OTHER): Payer: Self-pay | Admitting: Obstetrics & Gynecology

## 2021-11-09 ENCOUNTER — Encounter (HOSPITAL_BASED_OUTPATIENT_CLINIC_OR_DEPARTMENT_OTHER): Payer: Self-pay | Admitting: Radiology

## 2021-11-09 ENCOUNTER — Ambulatory Visit (INDEPENDENT_AMBULATORY_CARE_PROVIDER_SITE_OTHER): Payer: 59 | Admitting: Obstetrics & Gynecology

## 2021-11-09 VITALS — BP 129/93 | HR 64 | Ht 67.0 in | Wt 172.4 lb

## 2021-11-09 DIAGNOSIS — R6882 Decreased libido: Secondary | ICD-10-CM | POA: Diagnosis not present

## 2021-11-09 DIAGNOSIS — N951 Menopausal and female climacteric states: Secondary | ICD-10-CM | POA: Diagnosis not present

## 2021-11-09 DIAGNOSIS — R232 Flushing: Secondary | ICD-10-CM

## 2021-11-09 DIAGNOSIS — Z1231 Encounter for screening mammogram for malignant neoplasm of breast: Secondary | ICD-10-CM | POA: Insufficient documentation

## 2021-11-09 MED ORDER — GABAPENTIN 100 MG PO CAPS: ORAL_CAPSULE | ORAL | 1 refills | Status: DC

## 2021-11-09 NOTE — Progress Notes (Unsigned)
GYNECOLOGY  VISIT  CC:   discuss HRT  HPI: 51 y.o. G62P2003 Divorced Black or [place] American female here for follow up of menopausal symptoms and to consider change in medication.  She has been on estratest with improvement in hot flashes and vaginal dryness.  Does not really have sleep issues so this hasn't really changed.  Major symptom she would like to see improved is libido.  Also, she does have concerns about breast cancer risk which we have discussed.  Ran out of HRT about a week or so ago so off now.    Options for treatment discussing including changing HRT dosage, transitioning to another medication for hot flashes and changing treatment option for libido.  If transitioning away from HRT, options for hot flashes include SSRI, SNRI, gabapentin, veozah.  All discussed.  Pt is going to try gabapentin.  Instructions for how to titrate up this dosage discussed.  Also, does feel she will not restart HRT but would like to continue with testosterone.  Topical dosing alone and through compounding pharmacy discussed.  Rx will be sent.  Advised pt she can stop using progesterone as well.  She did have a little spotting each month after using it but this should resolve after stopping.  Last pap 04/2021.  MMG due.  Pt just hasn't scheduled.  Was planning to do here at [facility].  Advised to go to radiology dept before she leaves to schedule or see if can be worked in today.    Past Medical History:  Diagnosis Date   Allergy    SEASONAL   Anxiety    Arthritis    Depression    Disc degeneration, lumbar 2011   Heartburn    Hyperlipidemia    Hypertension    IBS (irritable bowel syndrome)     MEDS:   Current Outpatient Medications on File Prior to Visit  Medication Sig Dispense Refill   betamethasone dipropionate 0.05 % cream Apply topically 2 (two) times daily as needed (Rash). 45 g 11   cyclobenzaprine (FLEXERIL) 10 MG tablet Take 1 tablet (10 mg total) by mouth 2 (two) times daily as  needed for muscle spasms. 60 tablet 2   ezetimibe (ZETIA) 10 MG tablet Take 1 tablet (10 mg total) by mouth at bedtime. Please call to schedule an overdue appointment with Dr. Gasper Sells for refills, [phone], thank you. FINAL ATTEMPT 15 tablet 0   hydrochlorothiazide (HYDRODIURIL) 25 MG tablet Take 1 tablet (25 mg total) by mouth daily. 90 tablet 1   ibuprofen (ADVIL) 600 MG tablet TAKE 1 TABLET (600 MG TOTAL) BY MOUTH 2 (TWO) TIMES DAILY AS NEEDED. 60 tablet 3   losartan (COZAAR) 50 MG tablet Take 1 tablet (50 mg total) by mouth daily. 90 tablet 1   nitroGLYCERIN (NITROSTAT) 0.4 MG SL tablet Place 1 tablet (0.4 mg total) under the tongue every 5 (five) minutes as needed for chest pain. 25 tablet 3   NONFORMULARY OR COMPOUNDED ITEM Vitamin E vaginal cream 200u/ml.  One ml pv three times weekly. 36 each 3   omeprazole (PRILOSEC) 20 MG capsule Take 1 capsule (20 mg total) by mouth as needed. 90 capsule 0   Polyethyl Glycol-Propyl Glycol (SYSTANE) 0.4-0.3 % SOLN Place 1 drop into both eyes daily as needed (Dry eye).     rosuvastatin (CRESTOR) 20 MG tablet Take 1 tablet (20 mg total) by mouth daily. Please call to schedule an overdue appointment with Dr. Gasper Sells for refills, [phone], thank you. FINAL ATTEMPT 15 tablet  0   SODIUM FLUORIDE 5000 PPM 1.1 % PSTE See admin instructions.     triamcinolone (NASACORT) 55 MCG/ACT AERO nasal inhaler Place 2 sprays into the nose daily. (Patient taking differently: Place 2 sprays into the nose daily as needed (Allergies).) 1 Inhaler 0   estrogen-methylTESTOSTERone 0.625-1.25 MG tablet Take 1 tablet by mouth daily. (Patient not taking: Reported on 11/09/2021) 90 tablet 1   gabapentin (NEURONTIN) 100 MG capsule Take 1 capsule qhs prn hot flashes (Patient not taking: Reported on 11/09/2021) 90 capsule 1   progesterone (PROMETRIUM) 200 MG capsule Take days 1-15 each month (Patient not taking: Reported on 11/09/2021) 45 capsule 4   No current  facility-administered medications on file prior to visit.    ALLERGIES: Apple juice  SH:  divorced, non smoker  Review of Systems  Constitutional: Negative.     PHYSICAL EXAMINATION:    BP (!) 129/93   Pulse 64   Ht '5\' 7"'$  (1.702 m)   Wt 172 lb 6.4 oz (78.2 kg)   BMI 27.00 kg/m     General appearance: alert, cooperative and appears stated age No other exam performed today.  Assessment/Plan: 1. Menopausal symptoms - pt is going to stay off HRT for now.  Does not need to restart progesterone either  2. Hot flashes - will start gabepentin for hot flash treatment - gabapentin (NEURONTIN) 100 MG capsule; Take 1- 2 capsule qhs prn hot flashes or insomnia  Dispense: 180 capsule; Refill: 1  3. Decreased libido - will start topical testosterone.  Will need testosterone level in 3 months.  If does not have any improvement, can stop and does not need to have blood level drawn.  Pt aware this does have some conversion to estrogen so this does also present some risks for breast disease.  Voiced understanding. - NONFORMULARY OR COMPOUNDED ITEM; Topical testosterone cream '1mg'$ /0.71m.  Apply topically 0.131mtopically three times weekly.  Disp: 3 month supply.  #0RF.  Dispense: 1 each; Refill: 1   Total time with pt:  22 minutes, documentation 5 minutes.  Total time 27 minutes.

## 2021-11-10 DIAGNOSIS — R6882 Decreased libido: Secondary | ICD-10-CM | POA: Insufficient documentation

## 2021-11-10 DIAGNOSIS — N951 Menopausal and female climacteric states: Secondary | ICD-10-CM | POA: Insufficient documentation

## 2021-11-10 MED ORDER — NONFORMULARY OR COMPOUNDED ITEM: 1 refills | Status: DC

## 2021-11-18 IMAGING — MG MM DIGITAL SCREENING BILAT W/ TOMO AND CAD
8 series · 8 of 24 positions shown · non-contrast
Comparison: Previous exam(s).

CLINICAL DATA: Screening.

EXAM:
DIGITAL SCREENING BILATERAL MAMMOGRAM WITH TOMOSYNTHESIS AND CAD
TECHNIQUE: Bilateral screening digital craniocaudal and mediolateral oblique
mammograms were obtained. Bilateral screening digital breast
tomosynthesis was performed. The images were evaluated with
computer-aided detection.

[R MLO synth-2D]
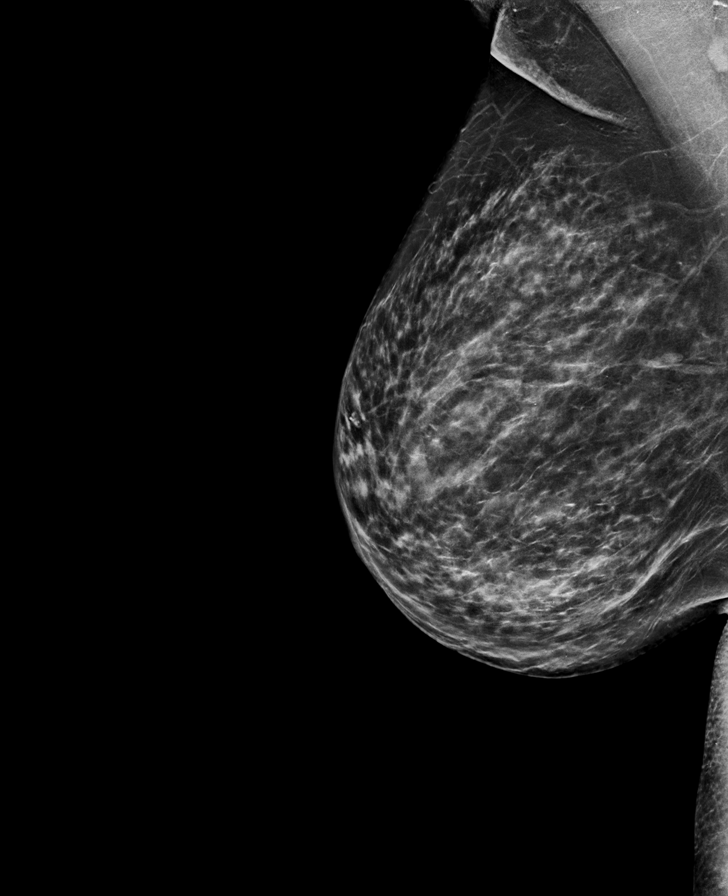

[L MLO synth-2D]
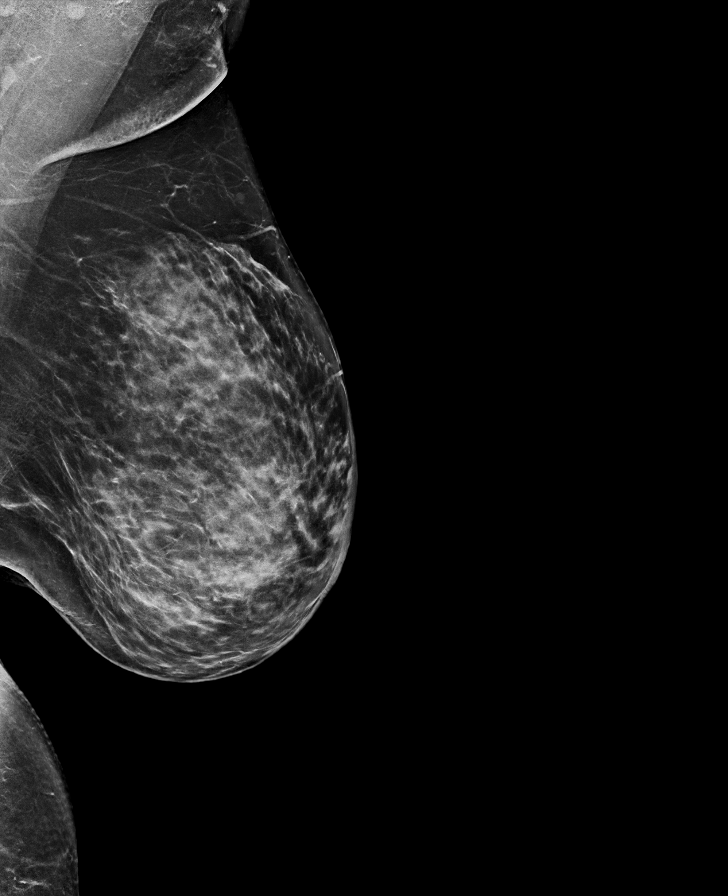

[L CC synth-2D]
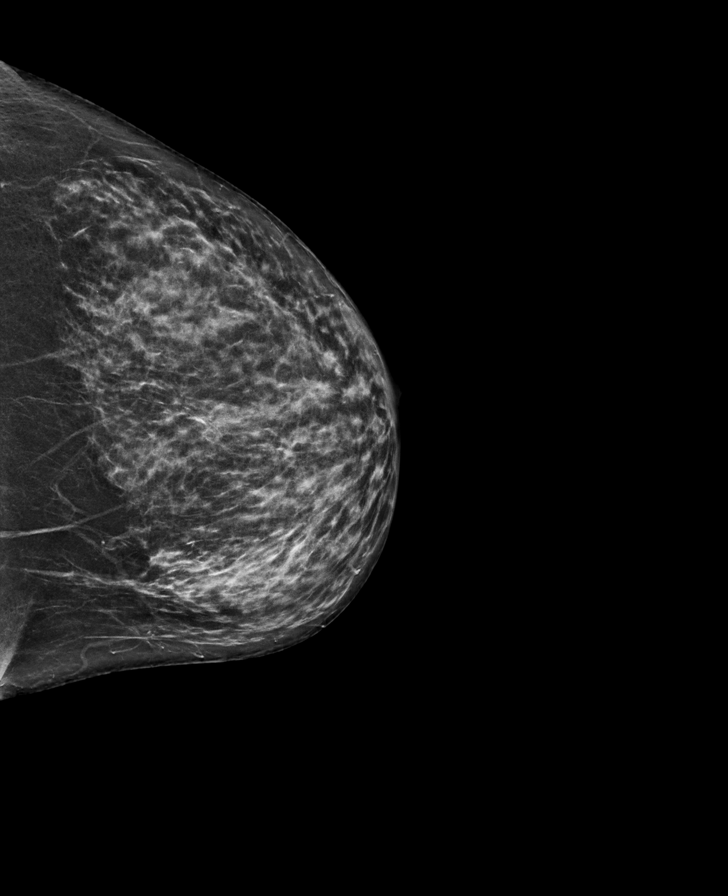

[R CC synth-2D]
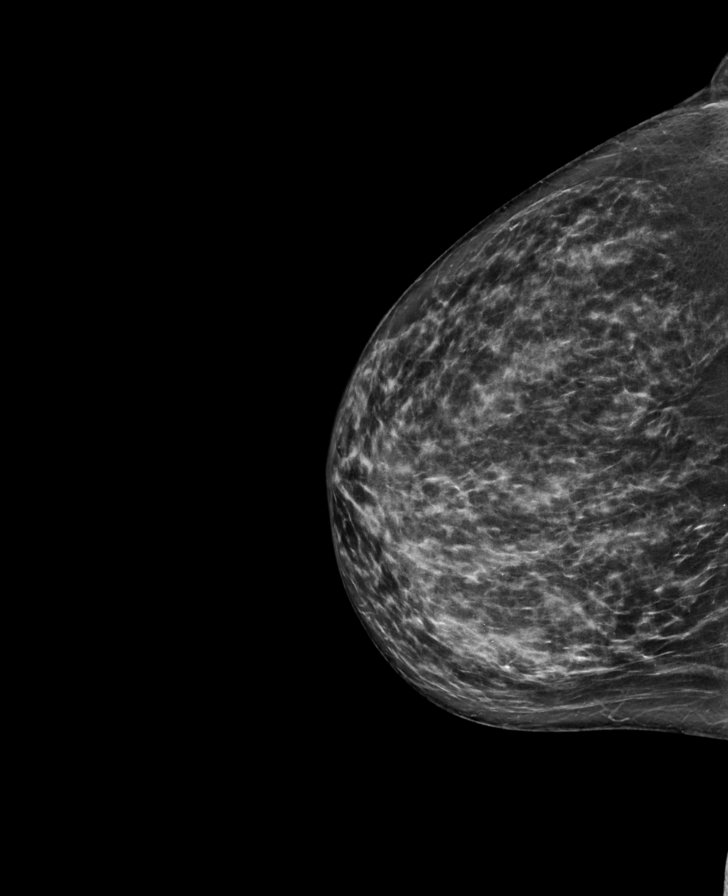

[R MLO tomo · tomo slice 39/76.0]
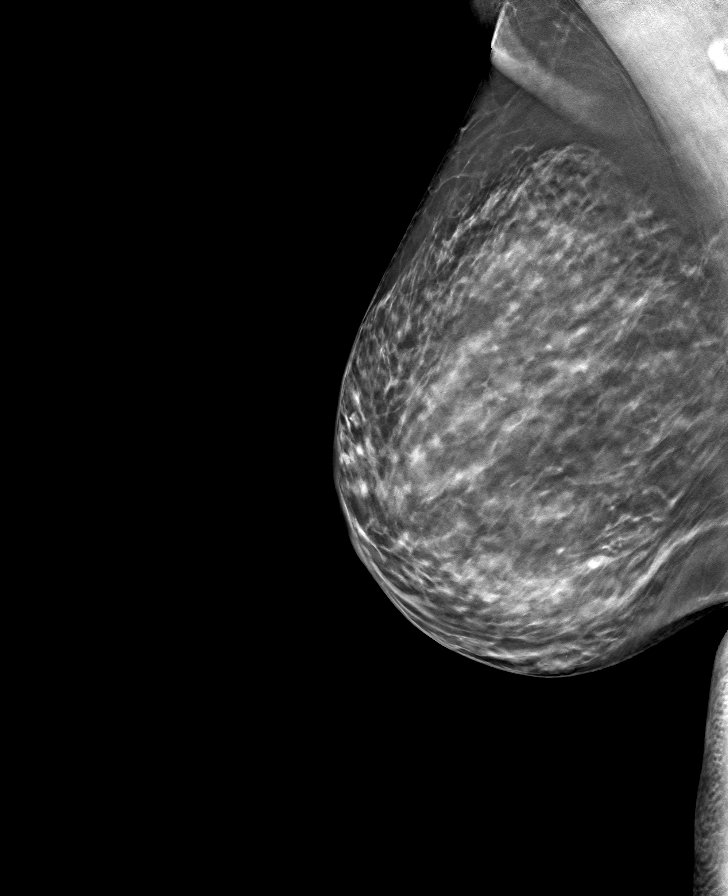

[R CC tomo · tomo slice 36/71.0]
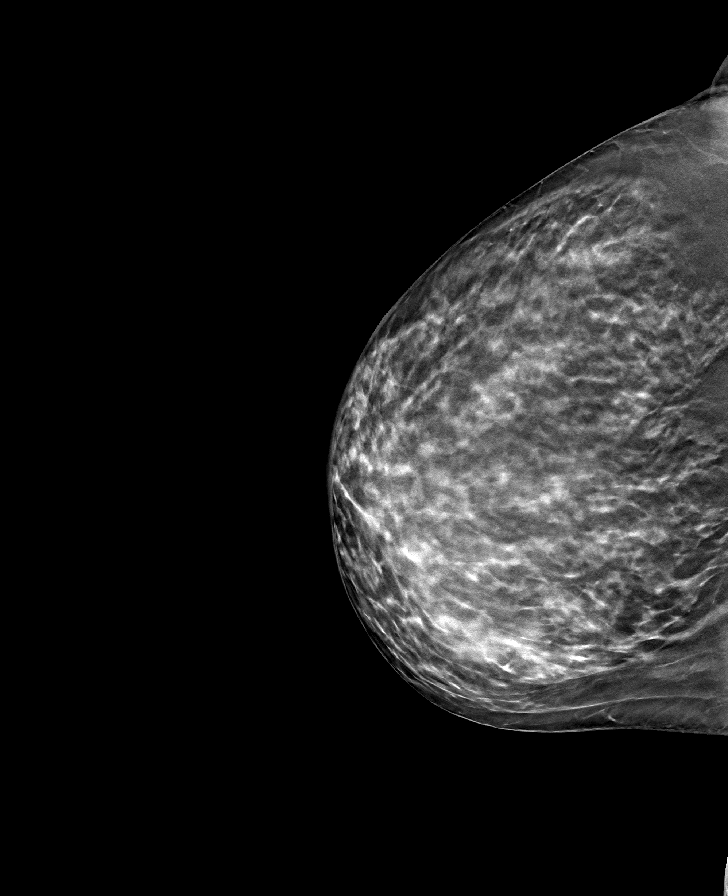

[L MLO tomo · tomo slice 41/80.0]
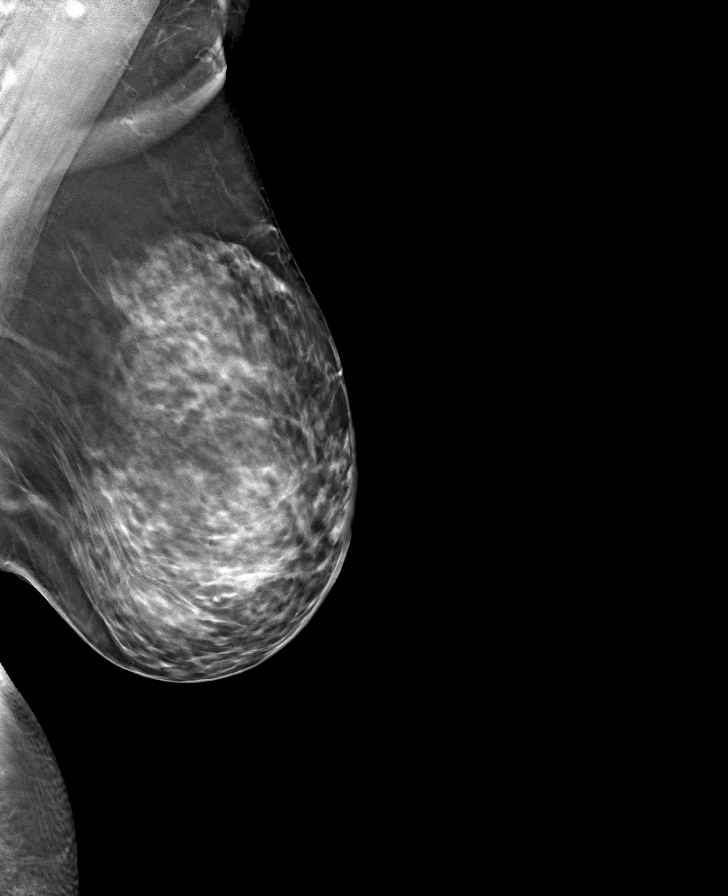

[L CC tomo · tomo slice 33/66.0]
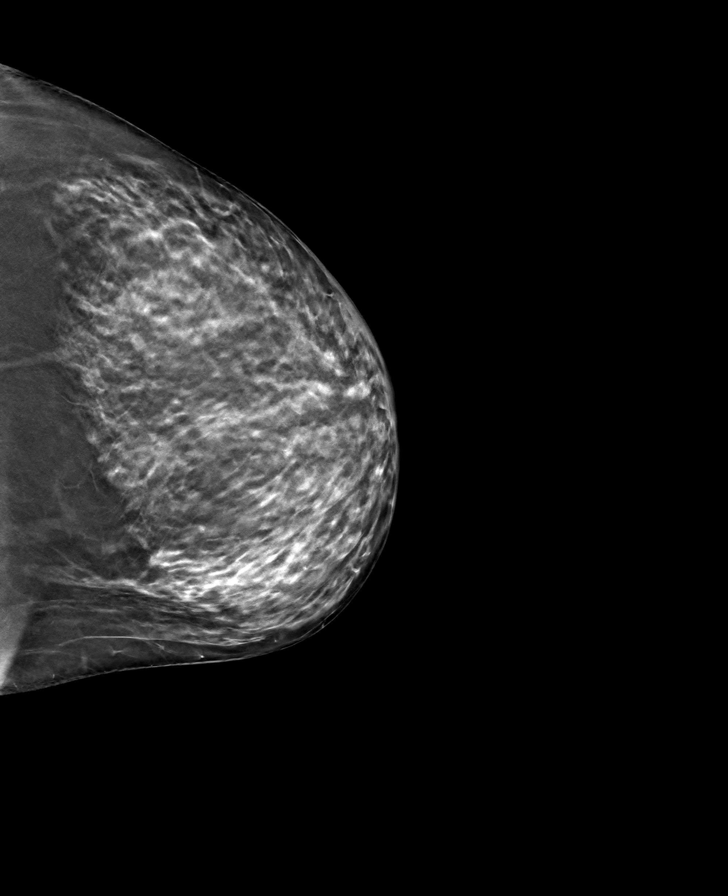

[8 of 24 positions shown; findings below may reference images not displayed]

ACR Breast Density Category c: The breast tissue is heterogeneously
dense, which may obscure small masses.
FINDINGS: There are no findings suspicious for malignancy. The images were
evaluated with computer-aided detection.
IMPRESSION: No mammographic evidence of malignancy. A result letter of this
screening mammogram will be mailed directly to the patient.

RECOMMENDATION:
Screening mammogram in one year. (Code:T4-5-GWO)

BI-RADS CATEGORY  1: Negative.

## 2021-11-23 ENCOUNTER — Other Ambulatory Visit (HOSPITAL_BASED_OUTPATIENT_CLINIC_OR_DEPARTMENT_OTHER): Payer: Self-pay | Admitting: *Deleted

## 2021-11-23 ENCOUNTER — Encounter (HOSPITAL_BASED_OUTPATIENT_CLINIC_OR_DEPARTMENT_OTHER): Payer: Self-pay | Admitting: Obstetrics & Gynecology

## 2021-11-23 DIAGNOSIS — R6882 Decreased libido: Secondary | ICD-10-CM

## 2021-11-23 MED ORDER — NONFORMULARY OR COMPOUNDED ITEM: 1 refills | Status: DC

## 2021-11-23 NOTE — Progress Notes (Signed)
Reordered Rx for testosterone so that it could be resent to pharmacy as they did not receive it.

## 2021-12-10 ENCOUNTER — Encounter: Payer: Self-pay | Admitting: Internal Medicine

## 2021-12-10 ENCOUNTER — Ambulatory Visit: Payer: 59 | Attending: Internal Medicine | Admitting: Internal Medicine

## 2021-12-10 VITALS — BP 119/85 | HR 85 | Ht 67.0 in | Wt 169.0 lb

## 2021-12-10 DIAGNOSIS — N951 Menopausal and female climacteric states: Secondary | ICD-10-CM | POA: Diagnosis not present

## 2021-12-10 DIAGNOSIS — R0609 Other forms of dyspnea: Secondary | ICD-10-CM

## 2021-12-10 DIAGNOSIS — E7801 Familial hypercholesterolemia: Secondary | ICD-10-CM

## 2021-12-10 NOTE — Patient Instructions (Signed)
Medication Instructions:  Your physician recommends that you continue on your current medications as directed. Please refer to the Current Medication list given to you today.  *If you need a refill on your cardiac medications before your next appointment, please call your pharmacy*   Lab Work: NOV 2023: FLP (NOTHING TO EAT OR DRINK 8-12 HRS BEFORE, EXCEPT WATER AND BLACK COFFEE)  If you have labs (blood work) drawn today and your tests are completely normal, you will receive your results only by: MyChart Message (if you have MyChart) OR A paper copy in the mail If you have any lab test that is abnormal or we need to change your treatment, we will call you to review the results.   Testing/Procedures: None   Follow-Up: At [facility], you and your health needs are our priority.  As part of our continuing mission to provide you with exceptional heart care, we have created designated Provider Care Teams.  These Care Teams include your primary Cardiologist (physician) and Advanced Practice Providers (APPs -  Physician Assistants and Nurse Practitioners) who all work together to provide you with the care you need, when you need it.  We recommend signing up for the patient portal called "MyChart".  Sign up information is provided on this After Visit Summary.  MyChart is used to connect with patients for Virtual Visits (Telemedicine).  Patients are able to view lab/test results, encounter notes, upcoming appointments, etc.  Non-urgent messages can be sent to your provider as well.   To learn more about what you can do with MyChart, go to [email or web address].    Your next appointment:   1 year(s)  The format for your next appointment:   In Person  Provider:   Rudean Haskell, MD   Other Instructions   Important Information About Sugar

## 2021-12-10 NOTE — Progress Notes (Signed)
Cardiology Office Note:    Date:  12/10/2021   ID:  Brandy [name], DOB [birth year], MRN [id number]  PCP:  Charlott Rakes, MD  [facility] Cardiologist:  Rudean Haskell MD [place] Electrophysiologist:  None   CC: SOB  History of Present Illness:    Brandy [name] is a 51 y.o. female with a hx of HTN, Familial Hypercholesterolemia, Carpal Tunnel Syndrome, who presents for evaluation 02/22/20.  In interim of this visit, patient negative CCTA.  Seen 04/24/20.  In interim of this visit, patient abnormal CPET. Had exercise RHC with normal findings.  Patient notes that she is doing well.   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  There are no interval hospital/ED visit.    No chest pain or pressure .  No SOB.  Breathing comes and goes but is overall has felt ok. No PND/Orthopnea.  No weight gain or leg swelling.  No palpitations or syncope.  Rare near syncope when can't catch her breath.  Has stopped her cholesterol therapy and would like to not return if feasible.   Past Medical History:  Diagnosis Date   Allergy    SEASONAL   Anxiety    Arthritis    Depression    Disc degeneration, lumbar 2011   Heartburn    Hyperlipidemia    Hypertension    IBS (irritable bowel syndrome)     Past Surgical History:  Procedure Laterality Date   RIGHT HEART CATH N/A 06/12/2020   Procedure: RIGHT HEART CATH;  Surgeon: Jolaine Artist, MD;  Location: [facility];  Service: Cardiovascular;  Laterality: N/A;   TUBAL LIGATION      Current Medications: Current Meds  Medication Sig   betamethasone dipropionate 0.05 % cream Apply topically 2 (two) times daily as needed (Rash).   cyclobenzaprine (FLEXERIL) 10 MG tablet Take 1 tablet (10 mg total) by mouth 2 (two) times daily as needed for muscle spasms.   ezetimibe (ZETIA) 10 MG tablet Take 1 tablet (10 mg total) by mouth at bedtime. Please call to schedule an overdue appointment with Dr.  Gasper Sells for refills, [phone], thank you. FINAL ATTEMPT   gabapentin (NEURONTIN) 100 MG capsule Take 1- 2 capsule qhs prn hot flashes or insomnia   hydrochlorothiazide (HYDRODIURIL) 25 MG tablet Take 1 tablet (25 mg total) by mouth daily.   ibuprofen (ADVIL) 600 MG tablet TAKE 1 TABLET (600 MG TOTAL) BY MOUTH 2 (TWO) TIMES DAILY AS NEEDED.   losartan (COZAAR) 50 MG tablet Take 1 tablet (50 mg total) by mouth daily.   nitroGLYCERIN (NITROSTAT) 0.4 MG SL tablet Place 1 tablet (0.4 mg total) under the tongue every 5 (five) minutes as needed for chest pain.   NONFORMULARY OR COMPOUNDED ITEM Vitamin E vaginal cream 200u/ml.  One ml pv three times weekly.   NONFORMULARY OR COMPOUNDED ITEM Topical testosterone cream '1mg'$ /0.78m.  Apply topically 0.174mtopically three times weekly.  Disp: 3 month supply.  #0RF.   omeprazole (PRILOSEC) 20 MG capsule Take 1 capsule (20 mg total) by mouth as needed.   Polyethyl Glycol-Propyl Glycol (SYSTANE) 0.4-0.3 % SOLN Place 1 drop into both eyes daily as needed (Dry eye).   rosuvastatin (CRESTOR) 20 MG tablet Take 1 tablet (20 mg total) by mouth daily. Please call to schedule an overdue appointment with Dr. ChGasper Sellsor refills, [phone] you. FINAL ATTEMPT   SODIUM FLUORIDE 5000 PPM 1.1 % PSTE See admin instructions.   triamcinolone (NASACORT) 55 MCG/ACT AERO  nasal inhaler Place 2 sprays into the nose daily. (Patient taking differently: Place 2 sprays into the nose daily as needed (Allergies).)     Allergies:   Apple juice   Social History   Socioeconomic History   Marital status: Divorced    Spouse name: Not on file   Number of children: 3   Years of education: Not on file   Highest education level: 12th grade  Occupational History   Occupation: Chartered certified accountant  Tobacco Use   Smoking status: Never    Passive exposure: Never   Smokeless tobacco: Never  Vaping Use   Vaping Use: Never used  Substance and Sexual Activity   Alcohol use:  Yes    Comment: social   Drug use: No   Sexual activity: Yes    Partners: Male    Birth control/protection: Surgical, None    Comment: BTL  Other Topics Concern   Not on file  Social History Narrative   Not on file   Social Determinants of Health   Financial Resource Strain: Not on file  Food Insecurity: No Food Insecurity (03/21/2020)   Hunger Vital Sign    Worried About Running Out of Food in the Last Year: Never true    [place] in the Last Year: Never true  Transportation Needs: No Transportation Needs (03/21/2020)   PRAPARE - Hydrologist (Medical): No    Lack of Transportation (Non-Medical): No  Physical Activity: Not on file  Stress: Not on file  Social Connections: Not on file    Social:  Presents with her husband; her brother in law his a CT Transplant Surgeon at [employer] History: The patient's family history includes Breast cancer (age of onset: 39) in her maternal aunt; Breast cancer (age of onset: 33) in her mother; Diabetes in her father; Hyperlipidemia in her father and mother; Hypertension in her father and paternal grandfather; Prostate cancer in her paternal grandfather; Stomach cancer in her cousin; Thyroid disease in her father and mother. There is no history of Colon cancer, Colon polyps, Esophageal cancer, or Rectal cancer. History of coronary artery disease notable for cousin. History of heart failure notable for no members. History of arrhythmia notable for no members. No history of bicuspid aortic valve or aortic aneurysm or dissection.  ROS:   Please see the history of present illness.     All other systems reviewed and are negative.  EKGs/Labs/Other Studies Reviewed:    The following studies were reviewed today:  EKG:   12/10/21: SR LVH 06/07/20: SR rate 69 1st HB PR 4/20 with occasional PAC 02/01/20: SR rate 87, LAE, borderline evidence for ant. infarct  RIGHT HEART CATH 06/12/2020  Narrative 1)  Resting RHC  RA = 8 RV = 25/10 PA = 26/13 (19) PCW = 12  - no prominent v waves Fick cardiac output/index = 7.2/3.8 Thermo CO/CI 5.3 PVR = 1.0 WU Ao sat = 99% PA sat = 81%, 82% SVC sat = 80%  2) Unloaded on bike  PA = 29/14 (22) PCW = 15 Thermo CO 7.0L/min  3) Cycling 10W  PA = 31/15 (24) PCW = 15 Thermo CO 7.0L/min  4) Cycling 20W  PA = 36/17 (26) PCW = 14 Thermo CO 8.1L/min  5) Cycling 30W  PA = 36/17 (24) PCW = 14 Thermo CO 7.0L/min      ECHO COMPLETE WO IMAGING ENHANCING AGENT 03/14/2020  Narrative ECHOCARDIOGRAM REPORT    Patient  Name:   Brandy [name] Date of Exam: 03/14/2020 Medical Rec #:  [id number]              Height:       66.0 in Accession #:    [id number]             Weight:       168.8 lb Date of Birth:  1970/09/11              BSA:          1.861 m Patient Age:    8 years               BP:           110/80 mmHg Patient Gender: F                      HR:           87 bpm. Exam Location:  [place]  Procedure: 2D Echo, Cardiac Doppler and Color Doppler  Indications:    R06.00 Dyspnea  History:        Patient has prior history of Echocardiogram examinations, most recent 07/09/2016. Risk Factors:Hypertension.  Sonographer:    Cresenciano Lick RDCS Referring Phys: [id number] [facility] A Myliyah Rebuck  IMPRESSIONS   1. Left ventricular ejection fraction, by estimation, is 60 to 65%. The left ventricle has normal function. The left ventricle has no regional wall motion abnormalities. Indeterminate diastolic filling due to E-A fusion. 2. Right ventricular systolic function is normal. The right ventricular size is normal. 3. The mitral valve is grossly normal. No evidence of mitral valve regurgitation. 4. The aortic valve is tricuspid. Aortic valve regurgitation is not visualized. No aortic stenosis is present. 5. Aortic dilatation noted. There is borderline dilatation of the aortic root, measuring 36 mm. There is mild  dilatation of the ascending aorta, measuring 41 mm. 6. The inferior vena cava is normal in size with greater than 50% respiratory variability, suggesting right atrial pressure of 3 mmHg.  Comparison(s): A prior study was performed on 07/09/2016. Increase in aortic root size; ascending aorta not as well visualized in prior study.  FINDINGS Left Ventricle: Left ventricular ejection fraction, by estimation, is 60 to 65%. The left ventricle has normal function. The left ventricle has no regional wall motion abnormalities. The left ventricular internal cavity size was small. There is no left ventricular hypertrophy. Indeterminate diastolic filling due to E-A fusion.  Right Ventricle: The right ventricular size is normal. No increase in right ventricular wall thickness. Right ventricular systolic function is normal.  Left Atrium: Left atrial size was normal in size.  Right Atrium: Right atrial size was normal in size.  Pericardium: There is no evidence of pericardial effusion.  Mitral Valve: The mitral valve is grossly normal. No evidence of mitral valve regurgitation.  Tricuspid Valve: The tricuspid valve is normal in structure. Tricuspid valve regurgitation is not demonstrated.  Aortic Valve: The aortic valve is tricuspid. Aortic valve regurgitation is not visualized. No aortic stenosis is present.  Pulmonic Valve: The pulmonic valve was grossly normal. Pulmonic valve regurgitation is not visualized.  Aorta: Ascending Aortic Index: 2.45 moderate. Aortic dilatation noted. There is borderline dilatation of the aortic root, measuring 36 mm. There is mild dilatation of the ascending aorta, measuring 41 mm.  Venous: The inferior vena cava is normal in size with greater than 50% respiratory variability, suggesting right atrial pressure of 3 mmHg.  IAS/Shunts: The atrial septum is  grossly normal.   LEFT VENTRICLE PLAX 2D LVIDd:         3.40 cm  Diastology LVIDs:         2.00 cm  LV e' medial:     9.03 cm/s LV PW:         0.80 cm  LV E/e' medial:  6.5 LV IVS:        0.80 cm  LV e' lateral:   10.10 cm/s LVOT diam:     2.00 cm  LV E/e' lateral: 5.9 LV SV:         64 LV SV Index:   34 LVOT Area:     3.14 cm   RIGHT VENTRICLE RV Basal diam:  2.80 cm RV S prime:     17.20 cm/s TAPSE (M-mode): 2.5 cm  LEFT ATRIUM             Index       RIGHT ATRIUM          Index LA diam:        2.60 cm 1.40 cm/m  RA Area:     9.67 cm LA Vol (A2C):   38.3 ml 20.58 ml/m RA Volume:   18.70 ml 10.05 ml/m LA Vol (A4C):   20.3 ml 10.91 ml/m LA Biplane Vol: 30.8 ml 16.55 ml/m AORTIC VALVE LVOT Vmax:   109.50 cm/s LVOT Vmean:  82.800 cm/s LVOT VTI:    0.204 m  AORTA Ao Root diam: 3.70 cm Ao Asc diam:  4.10 cm  MITRAL VALVE MV Area (PHT): 2.60 cm    SHUNTS MV Decel Time: 292 msec    Systemic VTI:  0.20 m MV E velocity: 59.10 cm/s  Systemic Diam: 2.00 cm MV A velocity: 77.60 cm/s MV E/A ratio:  0.76  Rudean Haskell MD Electronically signed by Rudean Haskell MD Signature Date/Time: 03/14/2020/5:10:03 PM      LONG TERM MONITOR (8-14 DAYS) INTERPRETATION 05/22/2020  Narrative  Patient had a minimum heart rate of 56 bpm, maximum heart rate of 146 bpm, and average heart rate of 91 bpm.  Predominant underlying rhythm was sinus rhythm.  Isolated PACs were rare (<1.0%), with rare couplets present.  Isolated PVCs were rare (<1.0%), with rare couplets present.  No evidence of complete heart block .  No triggered and diary events.  No malignant arrhythmias.    CT CORONARY MORPH W/CTA COR W/SCORE W/CA W/CM &/OR WO/CM 03/15/2020  Addendum 03/15/2020 10:45 AM ADDENDUM REPORT: 03/15/2020 10:42  CLINICAL DATA:  51 Year-old African American Female  EXAM: Cardiac/Coronary  CTA  TECHNIQUE: The patient was scanned on a [employer].  FINDINGS: A 100 kV prospective scan was triggered in the descending thoracic aorta at 111 HU's. Axial non-contrast 3 mm  slices were carried out through the heart. The data set was analyzed on a dedicated work station and scored using the [place]. Gantry rotation speed was 250 msecs and collimation was .6 mm. No beta blockade and 0.8 mg of sl NTG was given. The 3D data set was reconstructed in 5% intervals of the 67-82 % of the R-R cycle. Diastolic phases were analyzed on a dedicated work station using MPR, MIP and VRT modes. The patient received 80 cc of contrast.  Aorta: Ascending aorta borderline dilation: 38.5 mm with ascending aortic index 2.3. No calcifications. No dissection.  Aortic Valve:  Tri-leaflet.  No calcifications.  Coronary Arteries:  Normal coronary origin.  Right dominance.  Coronary calcium score of 0. This was  1st percentile for age, sex, and race matched control.  RCA is a large dominant artery that gives rise to PDA and PLA. There is no plaque.  Left main is a large artery that gives rise to LAD and LCX arteries. There is no plaque.  LAD is a large, tortuous vessel that gives rise to a large D1 branch and has no plaque.  LCX is a non-dominant artery is tortuous and gives that gives rise to one large OM1 branch and a small OM2 vessel. The OM vessel runs near the diagonal vessel but there is no fistula seen. There is no plaque.  Other findings:  Normal pulmonary vein drainage into the left atrium.  Normal left atrial appendage without a thrombus.  Normal size of the pulmonary artery.  Extra-cardiac findings: See attached radiology report for non-cardiac structures.  IMPRESSION: 1. Coronary calcium score of 0. This was 1st percentile for age, sex, and race matched control.  2. Normal coronary origin with right dominance.  3. Ascending aorta borderline dilation: 38.5 mm with ascending aortic index 2.3.  4. Tortuous coronary arteries. CAD-RADS 0. No evidence of CAD (0%). Consider non-atherosclerotic causes of chest pain.   Electronically Signed By:  Rudean Haskell MD On: 03/15/2020 10:42  Narrative EXAM: OVER-READ INTERPRETATION  CT CHEST  The following report is an over-read performed by radiologist Dr. Abigail Miyamoto of [facility], [place] on 03/15/2020. This over-read does not include interpretation of cardiac or coronary anatomy or pathology. The coronary CTA interpretation by the cardiologist is attached.  COMPARISON:  05/14/2016 chest radiograph.  FINDINGS: Vascular: Upper normal ascending aortic size including at the sinotubular junction measuring 3.8 cm on 17/8. 3.8 cm in the mid ascending segment including on 09/08. Based on sagittal reformats, 3.8 cm in the mid ascending segment on image 71.  No central pulmonary embolism, on this non-dedicated study.  Mediastinum/Nodes: No imaged thoracic adenopathy.  Lungs/Pleura: No pleural fluid. Calcified granuloma at the anterior right lung base on 30/12.  Upper Abdomen: Normal imaged portions of the liver, spleen, stomach.  Musculoskeletal: No acute osseous abnormality.  IMPRESSION: 1.  No acute findings in the imaged extracardiac chest. 2. Upper normal ascending aortic size, 3.8 cm.    Recent Labs: 01/03/2021: Hemoglobin 15.7; Platelets 297 07/03/2021: ALT 43; BUN 9; Creatinine, Ser 1.02; Potassium 3.9; Sodium 142  Recent Lipid Panel    Component Value Date/Time   CHOL 94 (L) 07/03/2021 0950   TRIG 67 07/03/2021 0950   HDL 42 07/03/2021 0950   CHOLHDL 3.0 06/22/2020 0802   CHOLHDL 4.2 09/10/2017 0959   VLDL 26 09/10/2017 0959   LDLCALC 37 07/03/2021 0950   Risk Assessment/Calculations:     The ASCVD Risk score (Arnett DK, et al., 2019) failed to calculate for the following reasons:   The valid total cholesterol range is 130 to 320 mg/dL   Physical Exam:    VS:  BP 119/85   Pulse 85   Ht '5\' 7"'$  (1.702 m)   Wt 169 lb (76.7 kg)   SpO2 97%   BMI 26.47 kg/m     Wt Readings from Last 3 Encounters:  12/10/21 169 lb (76.7 kg)  11/09/21 172 lb  6.4 oz (78.2 kg)  07/03/21 175 lb 3.2 oz (79.5 kg)    GEN:  Well nourished, well developed in no acute distress HEENT: Normal NECK: No JVD LYMPHATICS: No lymphadenopathy CARDIAC: RRR, no murmurs, rubs, gallops RESPIRATORY:  Clear to auscultation without rales, wheezing or rhonchi  ABDOMEN:  Soft, non-tender, non-distended NEUROLOGIC:  Alert and oriented x 3 PSYCHIATRIC:  Normal affect   ASSESSMENT:    1. Familial hypercholesterolemia   2. Dyspnea on exertion   3. Menopausal symptoms     PLAN:    DOE - extensive work up with no cardiac etiology; improved without cardiac intervention; potentially menopause related  Hyperlipidemia (familial) With no CAC -LDL goal less than 100 - fasting lipids in November, may return her prior rosuvastatin or zetia based on results  Borderline Thoracic Aortic Dilation - 2024 Echo  One year f/u   Medication Adjustments/Labs and Tests Ordered: Current medicines are reviewed at length with the patient today.  Concerns regarding medicines are outlined above.  Orders Placed This Encounter  Procedures   Lipid panel   EKG 12-Lead   No orders of the defined types were placed in this encounter.   Patient Instructions  Medication Instructions:  Your physician recommends that you continue on your current medications as directed. Please refer to the Current Medication list given to you today.  *If you need a refill on your cardiac medications before your next appointment, please call your pharmacy*   Lab Work: NOV 2023: FLP (NOTHING TO EAT OR DRINK 8-12 HRS BEFORE, EXCEPT WATER AND BLACK COFFEE)  If you have labs (blood work) drawn today and your tests are completely normal, you will receive your results only by: MyChart Message (if you have MyChart) OR A paper copy in the mail If you have any lab test that is abnormal or we need to change your treatment, we will call you to review the  results.   Testing/Procedures: None   Follow-Up: At [facility], you and your health needs are our priority.  As part of our continuing mission to provide you with exceptional heart care, we have created designated Provider Care Teams.  These Care Teams include your primary Cardiologist (physician) and Advanced Practice Providers (APPs -  Physician Assistants and Nurse Practitioners) who all work together to provide you with the care you need, when you need it.  We recommend signing up for the patient portal called "MyChart".  Sign up information is provided on this After Visit Summary.  MyChart is used to connect with patients for Virtual Visits (Telemedicine).  Patients are able to view lab/test results, encounter notes, upcoming appointments, etc.  Non-urgent messages can be sent to your provider as well.   To learn more about what you can do with MyChart, go to [email or web address].    Your next appointment:   1 year(s)  The format for your next appointment:   In Person  Provider:   Rudean Haskell, MD   Other Instructions   Important Information About Sugar         Signed, Werner Lean, MD  12/10/2021 5:09 PM    [place]

## 2021-12-27 ENCOUNTER — Ambulatory Visit: Payer: 59 | Attending: Internal Medicine

## 2021-12-27 DIAGNOSIS — E7801 Familial hypercholesterolemia: Secondary | ICD-10-CM

## 2021-12-27 LAB — LIPID PANEL
Chol/HDL Ratio: 5.3 ratio — ABNORMAL HIGH (ref 0.0–4.4)
Cholesterol, Total: 256 mg/dL — ABNORMAL HIGH (ref 100–199)
HDL: 48 mg/dL (ref >39)
LDL Chol Calc (NIH): 177 mg/dL — ABNORMAL HIGH (ref 0–99)
Triglycerides: 169 mg/dL — ABNORMAL HIGH (ref 0–149)
VLDL Cholesterol Cal: 31 mg/dL (ref 5–40)

## 2021-12-31 ENCOUNTER — Telehealth: Payer: Self-pay

## 2021-12-31 DIAGNOSIS — E785 Hyperlipidemia, unspecified: Secondary | ICD-10-CM

## 2021-12-31 MED ORDER — ROSUVASTATIN CALCIUM 20 MG PO TABS: 20.0000 mg | ORAL_TABLET | Freq: Every day | ORAL | 3 refills | Status: DC

## 2021-12-31 NOTE — Telephone Encounter (Signed)
Removed Zetia 10 mg from medication list.

## 2021-12-31 NOTE — Addendum Note (Signed)
Addended by: Precious Gilding on: 12/31/2021 03:44 PM   Modules accepted: Orders

## 2021-12-31 NOTE — Telephone Encounter (Signed)
-----   Message from Werner Lean, MD sent at 12/30/2021  2:17 PM EST ----- LDL much worse off her medications - would consider return to rosuvastatin 20 mg and zetia 10 mg (this worked in the past)

## 2021-12-31 NOTE — Telephone Encounter (Signed)
The patient has been notified of the result and verbalized understanding.  All questions (if any) were answered. Precious Gilding, RN 12/31/2021 3:35 PM   Will come in for f/u fasting labs on 04/01/21. Pt will restart rosuvastatin 20 mg previously stopped medication d/t taking to many pills.  Advised pt of LDL and need for lowering to decrease risk of stroke.  Pt will have f/u labs and if zetia is needed will start at 3 month check.

## 2022-01-02 ENCOUNTER — Ambulatory Visit: Payer: 59 | Admitting: Family Medicine

## 2022-01-25 ENCOUNTER — Other Ambulatory Visit: Payer: Self-pay | Admitting: Family Medicine

## 2022-01-25 DIAGNOSIS — I10 Essential (primary) hypertension: Secondary | ICD-10-CM

## 2022-01-25 MED ORDER — LOSARTAN POTASSIUM 50 MG PO TABS: 50.0000 mg | ORAL_TABLET | Freq: Every day | ORAL | 1 refills | Status: DC

## 2022-02-15 ENCOUNTER — Ambulatory Visit: Payer: 59 | Attending: Family Medicine | Admitting: Family Medicine

## 2022-02-15 ENCOUNTER — Telehealth: Payer: Self-pay | Admitting: Family Medicine

## 2022-02-15 ENCOUNTER — Encounter: Payer: Self-pay | Admitting: Family Medicine

## 2022-02-15 VITALS — BP 113/80 | HR 88 | Temp 98.4°F | Ht 67.0 in | Wt 168.0 lb

## 2022-02-15 DIAGNOSIS — M47896 Other spondylosis, lumbar region: Secondary | ICD-10-CM | POA: Diagnosis not present

## 2022-02-15 DIAGNOSIS — F419 Anxiety disorder, unspecified: Secondary | ICD-10-CM

## 2022-02-15 DIAGNOSIS — I10 Essential (primary) hypertension: Secondary | ICD-10-CM

## 2022-02-15 DIAGNOSIS — Z23 Encounter for immunization: Secondary | ICD-10-CM

## 2022-02-15 DIAGNOSIS — F32A Depression, unspecified: Secondary | ICD-10-CM | POA: Diagnosis not present

## 2022-02-15 DIAGNOSIS — E7800 Pure hypercholesterolemia, unspecified: Secondary | ICD-10-CM

## 2022-02-15 DIAGNOSIS — G4709 Other insomnia: Secondary | ICD-10-CM

## 2022-02-15 MED ORDER — LOSARTAN POTASSIUM 50 MG PO TABS: 50.0000 mg | ORAL_TABLET | Freq: Every day | ORAL | 1 refills | Status: DC

## 2022-02-15 MED ORDER — HYDROXYZINE HCL 25 MG PO TABS: 25.0000 mg | ORAL_TABLET | Freq: Three times a day (TID) | ORAL | 1 refills | Status: DC | PRN

## 2022-02-15 MED ORDER — CYCLOBENZAPRINE HCL 10 MG PO TABS: 10.0000 mg | ORAL_TABLET | Freq: Two times a day (BID) | ORAL | 2 refills | Status: DC | PRN

## 2022-02-15 MED ORDER — DULOXETINE HCL 60 MG PO CPEP: 60.0000 mg | ORAL_CAPSULE | Freq: Every day | ORAL | 3 refills | Status: DC

## 2022-02-15 MED ORDER — HYDROCHLOROTHIAZIDE 25 MG PO TABS: 25.0000 mg | ORAL_TABLET | Freq: Every day | ORAL | 1 refills | Status: DC

## 2022-02-15 NOTE — Patient Instructions (Signed)

## 2022-02-15 NOTE — Telephone Encounter (Signed)
Patient with anxiety and depression interested in psychotherapy.  Thanks

## 2022-02-15 NOTE — Progress Notes (Signed)
Back pain Depression.

## 2022-02-15 NOTE — Progress Notes (Signed)
Subjective:  Patient ID: Brandy [name], female    DOB: 03/09/1970  Age: 51 y.o. MRN: [id number]  CC: Back Pain   HPI Brandy [name] is a 51 y.o. year old female with a history of hypertension, hyperlipidemia, anxiety and depression here for a follow up visit.    Interval History: Today she complains of worsening depression with elevated PHQ-9 score of 21.  In the past she was on Effexor which caused somnolence and she was switched to Zoloft which she stated was ineffective and she is currently not on medications. She wanted to try Psychotherapy at her last visit but never got to do it. She has insomnia due to worrying a lot and has a lot going on. She has wondered what it would be like if she was not here but she has not had any active suicidal plans or thoughts.  For her hot flashes she was placed on gabapentin by her GYN.  Her lower back bothers her intermittently and 2 days ago she could hardly stand. She can clean 10 -15 minutes and then it gets exacerbated, bending over worsens it  Pain does not radiate to her legs.  Xray lumbar spine from 11/2018 revealed: IMPRESSION: Stable mild degenerative changes at L5-S1. No acute osseous findings or significant malalignment.  Her labs reveal elevated cholesterol and she was placed back on her statin by cardiology. Past Medical History:  Diagnosis Date   Allergy    SEASONAL   Anxiety    Arthritis    Depression    Disc degeneration, lumbar 2011   Heartburn    Hyperlipidemia    Hypertension    IBS (irritable bowel syndrome)     Past Surgical History:  Procedure Laterality Date   RIGHT HEART CATH N/A 06/12/2020   Procedure: RIGHT HEART CATH;  Surgeon: Jolaine Artist, MD;  Location: [facility];  Service: Cardiovascular;  Laterality: N/A;   TUBAL LIGATION      Family History  Problem Relation Age of Onset   Breast cancer Mother 61       ER+   Thyroid disease Mother    Hyperlipidemia Mother     Diabetes Father    Hypertension Father        and aunt and uncles   Thyroid disease Father    Hyperlipidemia Father    Breast cancer Maternal Aunt 19   Hypertension Paternal Grandfather    Prostate cancer Paternal Grandfather    Stomach cancer Cousin    Colon cancer Neg Hx    Colon polyps Neg Hx    Esophageal cancer Neg Hx    Rectal cancer Neg Hx     Social History   Socioeconomic History   Marital status: Divorced    Spouse name: Not on file   Number of children: 3   Years of education: Not on file   Highest education level: 12th grade  Occupational History   Occupation: Chartered certified accountant  Tobacco Use   Smoking status: Never    Passive exposure: Never   Smokeless tobacco: Never  Vaping Use   Vaping Use: Never used  Substance and Sexual Activity   Alcohol use: Yes    Comment: social   Drug use: No   Sexual activity: Yes    Partners: Male    Birth control/protection: Surgical, None    Comment: BTL  Other Topics Concern   Not on file  Social History Narrative   Not on file   Social Determinants  of Health   Financial Resource Strain: Not on file  Food Insecurity: No Food Insecurity (03/21/2020)   Hunger Vital Sign    Worried About Running Out of Food in the Last Year: Never true    Ran Out of Food in the Last Year: Never true  Transportation Needs: No Transportation Needs (03/21/2020)   PRAPARE - Hydrologist (Medical): No    Lack of Transportation (Non-Medical): No  Physical Activity: Not on file  Stress: Not on file  Social Connections: Not on file    Allergies  Allergen Reactions   Apple Juice Hives    ANYTHING WITH APPLES IN IT    Outpatient Medications Prior to Visit  Medication Sig Dispense Refill   betamethasone dipropionate 0.05 % cream Apply topically 2 (two) times daily as needed (Rash). 45 g 11   gabapentin (NEURONTIN) 100 MG capsule Take 1- 2 capsule qhs prn hot flashes or insomnia 180 capsule 1   ibuprofen (ADVIL) 600  MG tablet TAKE 1 TABLET (600 MG TOTAL) BY MOUTH 2 (TWO) TIMES DAILY AS NEEDED. 60 tablet 3   nitroGLYCERIN (NITROSTAT) 0.4 MG SL tablet Place 1 tablet (0.4 mg total) under the tongue every 5 (five) minutes as needed for chest pain. 25 tablet 3   NONFORMULARY OR COMPOUNDED ITEM Vitamin E vaginal cream 200u/ml.  One ml pv three times weekly. 36 each 3   NONFORMULARY OR COMPOUNDED ITEM Topical testosterone cream 30m/0.10ml.  Apply topically 0.19mtopically three times weekly.  Disp: 3 month supply.  #0RF. 1 each 1   omeprazole (PRILOSEC) 20 MG capsule Take 1 capsule (20 mg total) by mouth as needed. 90 capsule 0   Polyethyl Glycol-Propyl Glycol (SYSTANE) 0.4-0.3 % SOLN Place 1 drop into both eyes daily as needed (Dry eye).     rosuvastatin (CRESTOR) 20 MG tablet Take 1 tablet (20 mg total) by mouth daily. Please call to schedule an overdue appointment with Dr. ChGasper Sellsor refills, [phone] you. FINAL ATTEMPT 90 tablet 3   SODIUM FLUORIDE 5000 PPM 1.1 % PSTE See admin instructions.     triamcinolone (NASACORT) 55 MCG/ACT AERO nasal inhaler Place 2 sprays into the nose daily. (Patient taking differently: Place 2 sprays into the nose daily as needed (Allergies).) 1 Inhaler 0   cyclobenzaprine (FLEXERIL) 10 MG tablet Take 1 tablet (10 mg total) by mouth 2 (two) times daily as needed for muscle spasms. 60 tablet 2   hydrochlorothiazide (HYDRODIURIL) 25 MG tablet Take 1 tablet (25 mg total) by mouth daily. 90 tablet 1   losartan (COZAAR) 50 MG tablet Take 1 tablet (50 mg total) by mouth daily. 90 tablet 1   No facility-administered medications prior to visit.     ROS Review of Systems  Constitutional:  Negative for activity change and appetite change.  HENT:  Negative for sinus pressure and sore throat.   Respiratory:  Negative for chest tightness, shortness of breath and wheezing.   Cardiovascular:  Negative for chest pain and palpitations.  Gastrointestinal:  Negative for abdominal  distention, abdominal pain and constipation.  Genitourinary: Negative.   Musculoskeletal:  Positive for back pain.  Psychiatric/Behavioral:  Positive for dysphoric mood. Negative for behavioral problems.     Objective:  BP 113/80   Pulse 88   Temp 98.4 F (36.9 C) (Oral)   Ht _0  (1.702 m)   Wt 168 lb (76.2 kg)   SpO2 97%   BMI 26.31 kg/m  02/15/2022    8:38 AM 12/10/2021    4:06 PM 11/09/2021   10:19 AM  BP/Weight  Systolic BP 540 086 761  Diastolic BP 80 85 93  Wt. (Lbs) 168 169 172.4  BMI 26.31 kg/m2 26.47 kg/m2 27 kg/m2      Physical Exam Constitutional:      Appearance: She is well-developed.  Cardiovascular:     Rate and Rhythm: Normal rate.     Heart sounds: Normal heart sounds. No murmur heard. Pulmonary:     Effort: Pulmonary effort is normal.     Breath sounds: Normal breath sounds. No wheezing or rales.  Chest:     Chest wall: No tenderness.  Abdominal:     General: Bowel sounds are normal. There is no distension.     Palpations: Abdomen is soft. There is no mass.     Tenderness: There is no abdominal tenderness.  Musculoskeletal:     Right lower leg: No edema.     Left lower leg: No edema.     Comments: Slight tenderness on palpation of mid lumbar spine  Neurological:     Mental Status: She is alert and oriented to person, place, and time.  Psychiatric:     Comments: Dysphoric mood, teary        Latest Ref Rng & Units 07/03/2021    9:50 AM 01/03/2021    4:25 PM 06/22/2020    8:02 AM  CMP  Glucose 70 - 99 mg/dL 97  93    BUN 6 - 24 mg/dL 9  12    Creatinine 0.57 - 1.00 mg/dL 1.02  1.02    Sodium 134 - 144 mmol/L 142  136    Potassium 3.5 - 5.2 mmol/L 3.9  3.3    Chloride 96 - 106 mmol/L 104  96    CO2 20 - 29 mmol/L 23  25    Calcium 8.7 - 10.2 mg/dL 9.6  9.6    Total Protein 6.0 - 8.5 g/dL 7.5   7.3   Total Bilirubin 0.0 - 1.2 mg/dL 0.4   0.4   Alkaline Phos 44 - 121 IU/L 64   92   AST 0 - 40 IU/L 30   30   ALT 0 - 32 IU/L 43    85     Lipid Panel     Component Value Date/Time   CHOL 256 (H) 12/27/2021 0751   TRIG 169 (H) 12/27/2021 0751   HDL 48 12/27/2021 0751   CHOLHDL 5.3 (H) 12/27/2021 0751   CHOLHDL 4.2 09/10/2017 0959   VLDL 26 09/10/2017 0959   LDLCALC 177 (H) 12/27/2021 0751    CBC    Component Value Date/Time   WBC 7.2 01/03/2021 1625   WBC 6.6 06/09/2019 1232   RBC 5.33 (H) 01/03/2021 1625   RBC 4.97 06/09/2019 1232   HGB 15.7 01/03/2021 1625   HCT 46.5 01/03/2021 1625   PLT 297 01/03/2021 1625   MCV 87 01/03/2021 1625   MCH 29.5 01/03/2021 1625   MCH 30.6 05/14/2016 1418   MCHC 33.8 01/03/2021 1625   MCHC 33.3 06/09/2019 1232   RDW 13.8 01/03/2021 1625   LYMPHSABS 1.9 01/03/2021 1625   MONOABS 0.4 06/09/2019 1232   EOSABS 0.0 01/03/2021 1625   BASOSABS 0.0 01/03/2021 1625    Lab Results  Component Value Date   HGBA1C 5.9 07/03/2021      02/15/2022    8:43 AM 07/03/2021    9:17 AM 04/24/2021  2:01 PM 03/21/2020   10:21 AM 02/01/2020    4:20 PM  Depression screen PHQ 2/9  Decreased Interest 3 1 0 1 0  Down, Depressed, Hopeless _0 0  PHQ - 2 Score _1 0  Altered sleeping _2 Tired, decreased energy _3 Change in appetite _4 0  Feeling bad or failure about yourself  2 1  0 0  Trouble concentrating _5 Moving slowly or fidgety/restless 2 0  0 0  Suicidal thoughts 1 0  0 0  PHQ-9 Score _6 Assessment & Plan:  1. Essential hypertension Controlled Continue current management Counseled on blood pressure goal of less than 130/80, low-sodium, DASH diet, medication compliance, 150 minutes of moderate intensity exercise per week. Discussed medication compliance, adverse effects. - CMP14+EGFR - hydrochlorothiazide (HYDRODIURIL) 25 MG tablet; Take 1 tablet (25 mg total) by mouth daily.  Dispense: 90 tablet; Refill: 1 - losartan (COZAAR) 50 MG tablet; Take 1 tablet (50 mg total) by mouth daily.  Dispense: 90 tablet; Refill: 1  2.  Other osteoarthritis of spine, lumbar region With intermittent flares We have discussed pathophysiology of osteoarthritis of the spine and management options At this point she would like to just try medications and has done physical therapy in the past so she does not want another referral at this time Advised that if pain is recalcitrant we can consider referral for epidural spinal injections and then orthopedics Also discussed studies that have revealed prolonged benefit with regards to yoga, aquatic therapy and she will be looking into this - cyclobenzaprine (FLEXERIL) 10 MG tablet; Take 1 tablet (10 mg total) by mouth 2 (two) times daily as needed.  Dispense: 60 tablet; Refill: 2  3. Anxiety and depression Uncontrolled Underlying stressors Refer to LCSW for psychotherapy Will initiate duloxetine given she did not do well on Zoloft and Effexor in the past.  Duloxetine also has analgesic properties which will help with her back pain Counseled on sedative side effects of hydroxyzine and she can use this at night to help with insomnia and anxiety - DULoxetine (CYMBALTA) 60 MG capsule; Take 1 capsule (60 mg total) by mouth daily.  Dispense: 30 capsule; Refill: 3 - hydrOXYzine (ATARAX) 25 MG tablet; Take 1 tablet (25 mg total) by mouth 3 (three) times daily as needed for anxiety.  Dispense: 60 tablet; Refill: 1  4. Other insomnia Ongoing stressors contributing to underlying hot flashes She is already on gabapentin and has been advised to take 2 capsules daily at night Hydroxyzine will also help with insomnia  5. Need for shingles vaccine - Varicella-zoster vaccine IM  6.  Hypercholesterolemia Uncontrolled as she has been off her statin She was recently restarted on Crestor by her cardiologist Low-cholesterol diet  Meds ordered this encounter  Medications   DULoxetine (CYMBALTA) 60 MG capsule    Sig: Take 1 capsule (60 mg total) by mouth daily.    Dispense:  30 capsule    Refill:  3    hydrOXYzine (ATARAX) 25 MG tablet    Sig: Take 1 tablet (25 mg total) by mouth 3 (three) times daily as needed for anxiety.    Dispense:  60 tablet    Refill:  1   cyclobenzaprine (FLEXERIL) 10 MG tablet    Sig: Take 1 tablet (10 mg total) by mouth 2 (two) times  daily as needed.    Dispense:  60 tablet    Refill:  2   hydrochlorothiazide (HYDRODIURIL) 25 MG tablet    Sig: Take 1 tablet (25 mg total) by mouth daily.    Dispense:  90 tablet    Refill:  1   losartan (COZAAR) 50 MG tablet    Sig: Take 1 tablet (50 mg total) by mouth daily.    Dispense:  90 tablet    Refill:  1    Follow-up: Return in about 6 months (around 08/17/2022) for Chronic medical conditions.       Charlott Rakes, MD, FAAFP. [facility] [place], [place]   02/15/2022, 10:21 AM

## 2022-02-16 LAB — CMP14+EGFR
ALT: 35 IU/L — ABNORMAL HIGH (ref 0–32)
AST: 25 IU/L (ref 0–40)
Albumin/Globulin Ratio: 1.7 (ref 1.2–2.2)
Albumin: 4.7 g/dL (ref 3.8–4.9)
Alkaline Phosphatase: 85 IU/L (ref 44–121)
BUN/Creatinine Ratio: 14 (ref 9–23)
BUN: 13 mg/dL (ref 6–24)
Bilirubin Total: 0.3 mg/dL (ref 0.0–1.2)
CO2: 26 mmol/L (ref 20–29)
Calcium: 9.9 mg/dL (ref 8.7–10.2)
Chloride: 99 mmol/L (ref 96–106)
Creatinine, Ser: 0.96 mg/dL (ref 0.57–1.00)
Globulin, Total: 2.8 g/dL (ref 1.5–4.5)
Glucose: 91 mg/dL (ref 70–99)
Potassium: 3.6 mmol/L (ref 3.5–5.2)
Sodium: 141 mmol/L (ref 134–144)
Total Protein: 7.5 g/dL (ref 6.0–8.5)
eGFR: 72 mL/min/{1.73_m2} (ref >59)

## 2022-03-06 NOTE — Telephone Encounter (Signed)
Spoke with patient she decided to reach out to a different clinic through her insurance. Will contact if there are any difficulties

## 2022-04-01 ENCOUNTER — Ambulatory Visit: Payer: 59 | Attending: Internal Medicine

## 2022-04-01 DIAGNOSIS — E785 Hyperlipidemia, unspecified: Secondary | ICD-10-CM

## 2022-04-01 LAB — LIPID PANEL
Chol/HDL Ratio: 2.7 ratio (ref 0.0–4.4)
Cholesterol, Total: 140 mg/dL (ref 100–199)
HDL: 52 mg/dL (ref >39)
LDL Chol Calc (NIH): 63 mg/dL (ref 0–99)
Triglycerides: 148 mg/dL (ref 0–149)
VLDL Cholesterol Cal: 25 mg/dL (ref 5–40)

## 2022-04-01 LAB — ALT: ALT: 22 IU/L (ref 0–32)

## 2022-04-02 ENCOUNTER — Ambulatory Visit: Payer: Self-pay | Admitting: Family Medicine

## 2022-08-01 ENCOUNTER — Ambulatory Visit: Payer: 59 | Admitting: Physician Assistant

## 2022-08-14 ENCOUNTER — Ambulatory Visit: Payer: 59 | Admitting: Family Medicine

## 2022-08-28 ENCOUNTER — Ambulatory Visit: Payer: 59 | Admitting: Physician Assistant

## 2022-11-14 ENCOUNTER — Ambulatory Visit: Payer: Managed Care, Other (non HMO) | Attending: Family Medicine | Admitting: Family Medicine

## 2022-11-14 ENCOUNTER — Encounter: Payer: Self-pay | Admitting: Family Medicine

## 2022-11-14 VITALS — BP 124/85 | HR 70 | Ht 67.0 in | Wt 173.6 lb

## 2022-11-14 DIAGNOSIS — I10 Essential (primary) hypertension: Secondary | ICD-10-CM | POA: Diagnosis not present

## 2022-11-14 DIAGNOSIS — R7303 Prediabetes: Secondary | ICD-10-CM | POA: Diagnosis not present

## 2022-11-14 DIAGNOSIS — M1711 Unilateral primary osteoarthritis, right knee: Secondary | ICD-10-CM | POA: Diagnosis not present

## 2022-11-14 DIAGNOSIS — M47896 Other spondylosis, lumbar region: Secondary | ICD-10-CM | POA: Diagnosis not present

## 2022-11-14 DIAGNOSIS — H6121 Impacted cerumen, right ear: Secondary | ICD-10-CM

## 2022-11-14 LAB — POCT GLYCOSYLATED HEMOGLOBIN (HGB A1C): HbA1c, POC (prediabetic range): 6 % (ref 5.7–6.4)

## 2022-11-14 MED ORDER — LOSARTAN POTASSIUM 50 MG PO TABS
50.0000 mg | ORAL_TABLET | Freq: Every day | ORAL | 1 refills | Status: DC
Start: 2022-11-14 — End: 2023-05-14

## 2022-11-14 MED ORDER — HYDROCHLOROTHIAZIDE 25 MG PO TABS: 25.0000 mg | ORAL_TABLET | Freq: Every day | ORAL | 1 refills | Status: DC

## 2022-11-14 MED ORDER — CYCLOBENZAPRINE HCL 10 MG PO TABS
10.0000 mg | ORAL_TABLET | Freq: Two times a day (BID) | ORAL | 2 refills | Status: DC | PRN
Start: 2022-11-14 — End: 2023-05-14

## 2022-11-15 LAB — CMP14+EGFR
ALT: 26 [IU]/L (ref 0–32)
AST: 21 [IU]/L (ref 0–40)
Albumin: 4.5 g/dL (ref 3.8–4.9)
Alkaline Phosphatase: 83 [IU]/L (ref 44–121)
BUN/Creatinine Ratio: 15 (ref 9–23)
BUN: 12 mg/dL (ref 6–24)
Bilirubin Total: 0.3 mg/dL (ref 0.0–1.2)
CO2: 29 mmol/L (ref 20–29)
Calcium: 9.7 mg/dL (ref 8.7–10.2)
Chloride: 101 mmol/L (ref 96–106)
Creatinine, Ser: 0.81 mg/dL (ref 0.57–1.00)
Globulin, Total: 2.6 g/dL (ref 1.5–4.5)
Glucose: 114 mg/dL — ABNORMAL HIGH (ref 70–99)
Potassium: 3.6 mmol/L (ref 3.5–5.2)
Sodium: 145 mmol/L — ABNORMAL HIGH (ref 134–144)
Total Protein: 7.1 g/dL (ref 6.0–8.5)
eGFR: 87 mL/min/{1.73_m2} (ref >59)

## 2022-11-20 ENCOUNTER — Encounter: Payer: Self-pay | Admitting: Physical Medicine & Rehabilitation

## 2022-11-23 ENCOUNTER — Ambulatory Visit (HOSPITAL_BASED_OUTPATIENT_CLINIC_OR_DEPARTMENT_OTHER)
Admission: RE | Admit: 2022-11-23 | Discharge: 2022-11-23 | Disposition: A | Discharge status: Home / Self Care | Payer: Managed Care, Other (non HMO) | Source: Ambulatory Visit | Attending: Obstetrics & Gynecology | Admitting: Obstetrics & Gynecology

## 2022-11-23 DIAGNOSIS — Z1231 Encounter for screening mammogram for malignant neoplasm of breast: Secondary | ICD-10-CM

## 2022-12-09 ENCOUNTER — Ambulatory Visit: Payer: Managed Care, Other (non HMO) | Admitting: Physician Assistant

## 2022-12-19 ENCOUNTER — Encounter
Payer: Managed Care, Other (non HMO) | Attending: Physical Medicine & Rehabilitation | Admitting: Physical Medicine & Rehabilitation

## 2022-12-19 ENCOUNTER — Encounter: Payer: Self-pay | Admitting: Physical Medicine & Rehabilitation

## 2022-12-19 VITALS — BP 111/79 | HR 92 | Ht 67.0 in | Wt 170.0 lb

## 2022-12-19 DIAGNOSIS — M4126 Other idiopathic scoliosis, lumbar region: Secondary | ICD-10-CM | POA: Insufficient documentation

## 2022-12-19 DIAGNOSIS — M25561 Pain in right knee: Secondary | ICD-10-CM | POA: Insufficient documentation

## 2022-12-19 DIAGNOSIS — F32A Depression, unspecified: Secondary | ICD-10-CM | POA: Insufficient documentation

## 2022-12-19 DIAGNOSIS — F419 Anxiety disorder, unspecified: Secondary | ICD-10-CM | POA: Insufficient documentation

## 2022-12-19 DIAGNOSIS — M25562 Pain in left knee: Secondary | ICD-10-CM | POA: Diagnosis present

## 2022-12-19 DIAGNOSIS — G8929 Other chronic pain: Secondary | ICD-10-CM | POA: Insufficient documentation

## 2022-12-19 DIAGNOSIS — M545 Low back pain, unspecified: Secondary | ICD-10-CM | POA: Diagnosis present

## 2022-12-19 MED ORDER — DULOXETINE HCL 30 MG PO CPEP: 30.0000 mg | ORAL_CAPSULE | Freq: Every day | ORAL | 0 refills | Status: DC

## 2022-12-19 NOTE — Progress Notes (Signed)
Subjective:    Patient ID: Brandy [name], female    DOB: 1970/02/28, 52 y.o.   MRN: [id number]  HPI  HPI  Brandy [name] is a 51 y.o. year old female  who  has a past medical history of Allergy, Anxiety, Arthritis, Depression, Disc degeneration, lumbar (2011), Heartburn, Hyperlipidemia, Hypertension, and IBS (irritable bowel syndrome).   They are presenting to PM&R clinic as a new patient for pain management evaluation. They were referred by Dr. Alvis Lemmings for treatment of lower back pain  pain.  Patient reports she has had pain in her back for normal years.  Pain is sharp and stabbing and does not radiate down her legs, is primarily in her lower back.  Bending backwards is much more painful than bending forward.  Pain is worsened with activity such as walking.  It limits her from doing home activities such as housework.    She additionally reports pain in her knees bilaterally due to osteoarthritis.  Says she has been [facility].  She says she got cortisone injections a few weeks ago and plans to get gel injections with [facility].  Patient reports having depressed mood.  Denies current SI or HI.  She has been prescribed duloxetine.  She says she stopped it because it was making her feel funny.  On discussing this further she does not think this symptom is very severe and she feels that maybe this was her feeling more relaxed.  She has history of primary care doctor was recommending she restart this medication to help with her mood.    Depression -  PCP discussed trying duloxetine again, she is talking with a therapist   Red flag symptoms: No red flags for back pain endorsed in Hx or ROS  Medications tried: Topical medications - voltaren gel, biofreeze- both help Nsaids Ibuprofen 600mg  twice a day- helping, meloxicam helped for some time. She was told to decrease due to stomach issues  Tylenol  - Doesn't help  Opiates - denies use Gabapentin - she  takes it for sleep TCAs  - denies  SNRIs  Duloxetine- made her feel strange Muscle relaxers- mild benefit  Other treatments: PT- Did not help, several years ago  TENs unit- relaxes it Injections - Knee injections at [facility] Denies  Prior UDS results: No results found for: "LABOPIA", "COCAINSCRNUR", "LABBENZ", "AMPHETMU", "THCU", "LABBARB"    Pain Inventory Average Pain 5 Pain Right Now 2 My pain is intermittent, sharp, stabbing, and punching  In the last 24 hours, has pain interfered with the following? General activity 5 Relation with others 5 Enjoyment of life 5 What TIME of day is your pain at its worst? morning  and evening Sleep (in general) Poor  Pain is worse with: bending and some activites Pain improves with: rest and medication Relief from Meds: 6  walk without assistance how many minutes can you walk? 30-40 ability to climb steps?  yes do you drive?  yes  employed # of hrs/week 40 I need assistance with the following:  household duties  depression anxiety  New pt  New pt    Family History  Problem Relation Age of Onset   Breast cancer Mother 21       ER+   Thyroid disease Mother    Hyperlipidemia Mother    Diabetes Father    Hypertension Father        and aunt and uncles   Thyroid disease Father    Hyperlipidemia  Father    Breast cancer Maternal Aunt 4   Hypertension Paternal Grandfather    Prostate cancer Paternal Grandfather    Stomach cancer Cousin    Colon cancer Neg Hx    Colon polyps Neg Hx    Esophageal cancer Neg Hx    Rectal cancer Neg Hx    Social History   Socioeconomic History   Marital status: Divorced    Spouse name: Not on file   Number of children: 3   Years of education: Not on file   Highest education level: 12th grade  Occupational History   Occupation: Company secretary  Tobacco Use   Smoking status: Never    Passive exposure: Never   Smokeless tobacco: Never  Vaping Use   Vaping status: Never Used   Substance and Sexual Activity   Alcohol use: Yes    Comment: social   Drug use: No   Sexual activity: Yes    Partners: Male    Birth control/protection: Surgical, None    Comment: BTL  Other Topics Concern   Not on file  Social History Narrative   Not on file   Social Determinants of Health   Financial Resource Strain: Not on file  Food Insecurity: No Food Insecurity (03/21/2020)   Hunger Vital Sign    Worried About Running Out of Food in the Last Year: Never true    Ran Out of Food in the Last Year: Never true  Transportation Needs: No Transportation Needs (03/21/2020)   PRAPARE - Administrator, Civil Service (Medical): No    Lack of Transportation (Non-Medical): No  Physical Activity: Not on file  Stress: Not on file  Social Connections: Not on file   Past Surgical History:  Procedure Laterality Date   RIGHT HEART CATH N/A 06/12/2020   Procedure: RIGHT HEART CATH;  Surgeon: Dolores Patty, MD;  Location: [facility];  Service: Cardiovascular;  Laterality: N/A;   TUBAL LIGATION     Past Medical History:  Diagnosis Date   Allergy    SEASONAL   Anxiety    Arthritis    Depression    Disc degeneration, lumbar 2011   Heartburn    Hyperlipidemia    Hypertension    IBS (irritable bowel syndrome)    BP 111/79   Pulse 92   Ht 5\' 7"  (1.702 m)   Wt 170 lb (77.1 kg)   SpO2 96%   BMI 26.63 kg/m   Opioid Risk Score:   Fall Risk Score:  `1  Depression screen [facility] 2/9     11/14/2022    3:11 PM 02/15/2022    8:43 AM 07/03/2021    9:17 AM 04/24/2021    2:01 PM 03/21/2020   10:21 AM 02/01/2020    4:20 PM 05/27/2019   12:18 PM  Depression screen PHQ 2/9  Decreased Interest 3 3 1  0 1 0 0  Down, Depressed, Hopeless 3 3 2 1 1  0 0  PHQ - 2 Score 6 6 3 1 2  0 0  Altered sleeping 2 3 3  2 1  0  Tired, decreased energy 2 2 1  2 1  0  Change in appetite 3 3 2  1  0 0  Feeling bad or failure about yourself  2 2 1   0 0 0  Trouble concentrating 3 2 1  1 1  0   Moving slowly or fidgety/restless 0 2 0  0 0 0  Suicidal thoughts 0 1 0  0  0   PHQ-9 Score 18 21 11  8 3  0     Review of Systems  Constitutional:  Positive for appetite change.  Respiratory:  Positive for shortness of breath.   Musculoskeletal:  Positive for back pain.  Psychiatric/Behavioral:  Positive for dysphoric mood. The patient is nervous/anxious.   All other systems reviewed and are negative.     Objective:   Physical Exam  Gen: no distress, normal appearing HEENT: oral mucosa pink and moist, NCAT Cardio: Reg rate Chest: normal effort, normal rate of breathing Abd: soft, non-distended Ext: no edema Psych: pleasant, normal affect Skin: intact Neuro: CN 2-12 grossly intact, follows commands, normal speech and language RUE: 5/5 Deltoid, 5/5 Biceps, 5/5 Triceps,  5/5 Grip LUE: 5/5 Deltoid, 5/5 Biceps, 5/5 Triceps,5/5 Grip RLE: HF 5/5, KE 5/5, KF 5/5, ADF 5/5, APF 5/5 LLE: HF 5/5, KE 5/5, KF, 5/5, ADF 5/5, APF 5/5 Sensory exam normal for light touch and pain in all 4 limbs. No limb ataxia or cerebellar signs. No abnormal tone appreciated.  DTR normal and symmetric b/l UE and LE No ankle clonus  Musculoskeletal:  SLR neg b/l + L spine tenderness greatest around L 4-5, milder pain over b/l SI joints No C spine tenderness No paraspinal muscle tenderness No greater trochanter tenderness  Pain with lumbar spinal extension Minimal pain with lumbar spinal flexion  Minimal b/l knee joint line tenderness Facet loading L spine  FABER and FADIR neg  L knee xray 07/07/21 "FINDINGS: No evidence of fracture, dislocation, or joint effusion. No evidence of arthropathy or other focal bone abnormality. Soft tissues are unremarkable.   IMPRESSION: No acute abnormality noted."  L spine ray 11/27/2018 FINDINGS: Five lumbar type vertebral bodies. There is a mild convex right thoracolumbar scoliosis. A grade 1 anterolisthesis and mild disc space narrowing at L5-S1 are stable. The  additional disc spaces are preserved. No evidence of acute fracture or pars defect. There is mild facet arthropathy inferiorly. The hip joint spaces appear preserved.   IMPRESSION: Stable mild degenerative changes at L5-S1. No acute osseous findings or significant malalignment.        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 Primarily axial pain   -Grade anterolisthesis, disc space narrowing L5-S1. Mild facet arthropathy 2) Depression / Anxiety, denies current SI or HI 3) B/L knee pain 4) Mild scoliosis   -She is going to [facility] for [facility] injections  -Exercises for back pain provided, advised to complete three times a week -Restart duloxetine.I think this could be beneficial to both her mood and pain.  Start at 30mg  daily, if tolerating can increase to 60mg  daily -X ray L spine ordered -Consider Lower L spine Facet MBB

## 2023-01-08 ENCOUNTER — Ambulatory Visit: Payer: 59 | Admitting: Family Medicine

## 2023-01-10 NOTE — Telephone Encounter (Signed)
Error

## 2023-02-17 ENCOUNTER — Encounter: Payer: Managed Care, Other (non HMO) | Admitting: Physical Medicine & Rehabilitation

## 2023-03-03 ENCOUNTER — Ambulatory Visit: Payer: Managed Care, Other (non HMO) | Attending: Physician Assistant | Admitting: Internal Medicine

## 2023-03-03 ENCOUNTER — Encounter: Payer: Self-pay | Admitting: Internal Medicine

## 2023-03-03 VITALS — BP 118/100 | HR 71 | Ht 67.0 in | Wt 172.0 lb

## 2023-03-03 DIAGNOSIS — E785 Hyperlipidemia, unspecified: Secondary | ICD-10-CM | POA: Diagnosis not present

## 2023-03-03 DIAGNOSIS — R42 Dizziness and giddiness: Secondary | ICD-10-CM

## 2023-03-03 DIAGNOSIS — E7801 Familial hypercholesterolemia: Secondary | ICD-10-CM | POA: Diagnosis not present

## 2023-03-03 DIAGNOSIS — I10 Essential (primary) hypertension: Secondary | ICD-10-CM | POA: Diagnosis not present

## 2023-03-03 LAB — BASIC METABOLIC PANEL
BUN/Creatinine Ratio: 19 (ref 9–23)
BUN: 16 mg/dL (ref 6–24)
CO2: 29 mmol/L (ref 20–29)
Calcium: 10.4 mg/dL — ABNORMAL HIGH (ref 8.7–10.2)
Chloride: 104 mmol/L (ref 96–106)
Creatinine, Ser: 0.84 mg/dL (ref 0.57–1.00)
Glucose: 97 mg/dL (ref 70–99)
Potassium: 4 mmol/L (ref 3.5–5.2)
Sodium: 141 mmol/L (ref 134–144)
eGFR: 84 mL/min/{1.73_m2} (ref >59)

## 2023-03-03 NOTE — Progress Notes (Signed)
 Cardiology Office Note:    Date:  03/03/2023   ID:  Brandy [name], DOB 1970-03-28, MRN [id number]  PCP:  Delbert Clam, MD  [facility] Cardiologist:  Stanly Leavens MD [facility] Electrophysiologist:  None   CC: Follow up FH  History of Present Illness:    Brandy [name] is a 53 y.o. female with a hx of HTN, Familial Hypercholesterolemia, Carpal Tunnel Syndrome, who presents for evaluation 02/22/20.  Patient negative CCTA.  Patient abnormal CPET but an exercise RHC with normal findings. 2023: moved to a new job.  Living situation changed for the better.   Improved her symptoms significant.  Discussed the use of AI scribe software for clinical note transcription with the patient, who gave verbal consent to proceed.  Brandy [name], a 52 year old female with a history of hypertension, familial hypercholesterolemia, and borderline thoracic aortic dilation, has been experiencing dizziness since last week. The dizziness was initially attributed to increased intake of pain medication (ibuprofen ) for knee pain. The patient reported taking two 600mg  doses of ibuprofen  intermittently last week, with the last intake being on Friday.  The patient's blood pressure was noted to be higher than usual, which could be due to the increased ibuprofen  intake, stress, or a possible increase in baseline blood pressure. The patient has been dealing with significant stress related to personal issues at home, which may be contributing to the elevated blood pressure and dizziness.  The patient's cholesterol levels have improved significantly, with a reduction of over 100 points since the initial consultation. The patient has a zero calcium  score from a previous CT scan, indicating no blockages in the arteries. The patient's aorta is borderline dilated, but not to the extent of an aneurysm (ULN for age an assuming BSA of 1.9 m2). No CP.  No SOB.   Past Medical History:  Diagnosis Date    Allergy    SEASONAL   Anxiety    Arthritis    Depression    Disc degeneration, lumbar 2011   Heartburn    Hyperlipidemia    Hypertension    IBS (irritable bowel syndrome)     Past Surgical History:  Procedure Laterality Date   RIGHT HEART CATH N/A 06/12/2020   Procedure: RIGHT HEART CATH;  Surgeon: Cherrie Toribio SAUNDERS, MD;  Location: [facility];  Service: Cardiovascular;  Laterality: N/A;   TUBAL LIGATION      Current Medications: Current Meds  Medication Sig   betamethasone  dipropionate 0.05 % cream Apply topically 2 (two) times daily as needed (Rash).   cyclobenzaprine  (FLEXERIL ) 10 MG tablet Take 1 tablet (10 mg total) by mouth 2 (two) times daily as needed.   DULoxetine  (CYMBALTA ) 30 MG capsule Take 1-2 capsules (30-60 mg total) by mouth daily. Start with 1 cap 30mg  for 2-3 weeks, if tolerating   gabapentin  (NEURONTIN ) 100 MG capsule Take 1- 2 capsule qhs prn hot flashes or insomnia   hydrochlorothiazide  (HYDRODIURIL ) 25 MG tablet Take 1 tablet (25 mg total) by mouth daily.   hydrOXYzine  (ATARAX ) 25 MG tablet Take 1 tablet (25 mg total) by mouth 3 (three) times daily as needed for anxiety.   ibuprofen  (ADVIL ) 600 MG tablet TAKE 1 TABLET (600 MG TOTAL) BY MOUTH 2 (TWO) TIMES DAILY AS NEEDED.   losartan  (COZAAR ) 50 MG tablet Take 1 tablet (50 mg total) by mouth daily.   meloxicam  (MOBIC ) 15 MG tablet Take 15 mg by mouth daily as needed.   nitroGLYCERIN  (NITROSTAT ) 0.4 MG SL tablet Place  1 tablet (0.4 mg total) under the tongue every 5 (five) minutes as needed for chest pain.   NONFORMULARY OR COMPOUNDED ITEM Vitamin E vaginal cream 200u/ml.  One ml pv three times weekly.   omeprazole  (PRILOSEC) 20 MG capsule Take 1 capsule (20 mg total) by mouth as needed.   Polyethyl Glycol-Propyl Glycol (SYSTANE) 0.4-0.3 % SOLN Place 1 drop into both eyes daily as needed (Dry eye).   rosuvastatin  (CRESTOR ) 20 MG tablet Take 1 tablet (20 mg total) by mouth daily. Please call to schedule  an overdue appointment with Dr. Santo for refills, [phone], thank you. FINAL ATTEMPT   SODIUM FLUORIDE 5000 PPM 1.1 % PSTE See admin instructions.   triamcinolone  (NASACORT ) 55 MCG/ACT AERO nasal inhaler Place 2 sprays into the nose daily. (Patient taking differently: Place 2 sprays into the nose daily as needed (Allergies).)     Allergies:   Apple juice   Social History   Socioeconomic History   Marital status: Divorced    Spouse name: Not on file   Number of children: 3   Years of education: Not on file   Highest education level: 12th grade  Occupational History   Occupation: company secretary  Tobacco Use   Smoking status: Never    Passive exposure: Never   Smokeless tobacco: Never  Vaping Use   Vaping status: Never Used  Substance and Sexual Activity   Alcohol use: Yes    Comment: social   Drug use: No   Sexual activity: Yes    Partners: Male    Birth control/protection: Surgical, None    Comment: BTL  Other Topics Concern   Not on file  Social History Narrative   Not on file   Social Drivers of Health   Financial Resource Strain: Not on file  Food Insecurity: No Food Insecurity (03/21/2020)   Hunger Vital Sign    Worried About Running Out of Food in the Last Year: Never true    Ran Out of Food in the Last Year: Never true  Transportation Needs: No Transportation Needs (03/21/2020)   PRAPARE - Administrator, Civil Service (Medical): No    Lack of Transportation (Non-Medical): No  Physical Activity: Not on file  Stress: Not on file  Social Connections: Not on file    Social:  Presented with her husband (now former); her brother in law is a CT Transplant Surgeon at [employer] and was virtually presents for som visits.  Family History: The patient's family history includes Breast cancer (age of onset: 64) in her maternal aunt; Breast cancer (age of onset: 42) in her mother; Diabetes in her father; Hyperlipidemia in her father and mother;  Hypertension in her father and paternal grandfather; Prostate cancer in her paternal grandfather; Stomach cancer in her cousin; Thyroid disease in her father and mother. There is no history of Colon cancer, Colon polyps, Esophageal cancer, or Rectal cancer. History of coronary artery disease notable for cousin. History of heart failure notable for no members. History of arrhythmia notable for no members. No history of bicuspid aortic valve or aortic aneurysm or dissection.  ROS:   Please see the history of present illness.     EKGs/Labs/Other Studies Reviewed:    The following studies were reviewed today:  Cardiac Studies & Procedures   CARDIAC CATHETERIZATION  CARDIAC CATHETERIZATION 06/12/2020  Narrative 1) Resting RHC  RA = 8 RV = 25/10 PA = 26/13 (19) PCW = 12  - no prominent v waves Fick  cardiac output/index = 7.2/3.8 Thermo CO/CI 5.3 PVR = 1.0 WU Ao sat = 99% PA sat = 81%, 82% SVC sat = 80%  2) Unloaded on bike  PA = 29/14 (22) PCW = 15 Thermo CO 7.0L/min  3) Cycling 10W  PA = 31/15 (24) PCW = 15 Thermo CO 7.0L/min  4) Cycling 20W  PA = 36/17 (26) PCW = 14 Thermo CO 8.1L/min  5) Cycling 30W  PA = 36/17 (24) PCW = 14 Thermo CO 7.0L/min    No evidence of exercise induced PAH or mitral regurgitation.  Toribio Fuel, MD 1:33 PM    ECHOCARDIOGRAM  ECHOCARDIOGRAM COMPLETE 03/14/2020  Narrative ECHOCARDIOGRAM REPORT    Patient Name:   Brandy [name] Date of Exam: 03/14/2020 Medical Rec #:  [id number]              Height:       66.0 in Accession #:    [id number]             Weight:       168.8 lb Date of Birth:  1970/12/11              BSA:          1.861 m Patient Age:    49 years               BP:           110/80 mmHg Patient Gender: F                      HR:           87 bpm. Exam Location:  [address]  Procedure: 2D Echo, Cardiac Doppler and Color Doppler  Indications:    R06.00 Dyspnea  History:        Patient has  prior history of Echocardiogram examinations, most recent 07/09/2016. Risk Factors:Hypertension.  Sonographer:    Carl Coma RDCS Referring Phys: [id number] [facility] Re[facility] Steller  IMPRESSIONS   1. Left ventricular ejection fraction, by estimation, is 60 to 65%. The left ventricle has normal function. The left ventricle has no regional wall motion abnormalities. Indeterminate diastolic filling due to E-A fusion. 2. Right ventricular systolic function is normal. The right ventricular size is normal. 3. The mitral valve is grossly normal. No evidence of mitral valve regurgitation. 4. The aortic valve is tricuspid. Aortic valve regurgitation is not visualized. No aortic stenosis is present. 5. Aortic dilatation noted. There is borderline dilatation of the aortic root, measuring 36 mm. There is mild dilatation of the ascending aorta, measuring 41 mm. 6. The inferior vena cava is normal in size with greater than 50% respiratory variability, suggesting right atrial pressure of 3 mmHg.  Comparison(s): A prior study was performed on 07/09/2016. Increase in aortic root size; ascending aorta not as well visualized in prior study.  FINDINGS Left Ventricle: Left ventricular ejection fraction, by estimation, is 60 to 65%. The left ventricle has normal function. The left ventricle has no regional wall motion abnormalities. The left ventricular internal cavity size was small. There is no left ventricular hypertrophy. Indeterminate diastolic filling due to E-A fusion.  Right Ventricle: The right ventricular size is normal. No increase in right ventricular wall thickness. Right ventricular systolic function is normal.  Left Atrium: Left atrial size was normal in size.  Right Atrium: Right atrial size was normal in size.  Pericardium: There is no evidence of pericardial effusion.  Mitral Valve: The mitral valve is  grossly normal. No evidence of mitral valve regurgitation.  Tricuspid Valve: The  tricuspid valve is normal in structure. Tricuspid valve regurgitation is not demonstrated.  Aortic Valve: The aortic valve is tricuspid. Aortic valve regurgitation is not visualized. No aortic stenosis is present.  Pulmonic Valve: The pulmonic valve was grossly normal. Pulmonic valve regurgitation is not visualized.  Aorta: Ascending Aortic Index: 2.45 moderate. Aortic dilatation noted. There is borderline dilatation of the aortic root, measuring 36 mm. There is mild dilatation of the ascending aorta, measuring 41 mm.  Venous: The inferior vena cava is normal in size with greater than 50% respiratory variability, suggesting right atrial pressure of 3 mmHg.  IAS/Shunts: The atrial septum is grossly normal.   LEFT VENTRICLE PLAX 2D LVIDd:         3.40 cm  Diastology LVIDs:         2.00 cm  LV e' medial:    9.03 cm/s LV PW:         0.80 cm  LV E/e' medial:  6.5 LV IVS:        0.80 cm  LV e' lateral:   10.10 cm/s LVOT diam:     2.00 cm  LV E/e' lateral: 5.9 LV SV:         64 LV SV Index:   34 LVOT Area:     3.14 cm   RIGHT VENTRICLE RV Basal diam:  2.80 cm RV S prime:     17.20 cm/s TAPSE (M-mode): 2.5 cm  LEFT ATRIUM             Index       RIGHT ATRIUM          Index LA diam:        2.60 cm 1.40 cm/m  RA Area:     9.67 cm LA Vol (A2C):   38.3 ml 20.58 ml/m RA Volume:   18.70 ml 10.05 ml/m LA Vol (A4C):   20.3 ml 10.91 ml/m LA Biplane Vol: 30.8 ml 16.55 ml/m AORTIC VALVE LVOT Vmax:   109.50 cm/s LVOT Vmean:  82.800 cm/s LVOT VTI:    0.204 m  AORTA Ao Root diam: 3.70 cm Ao Asc diam:  4.10 cm  MITRAL VALVE MV Area (PHT): 2.60 cm    SHUNTS MV Decel Time: 292 msec    Systemic VTI:  0.20 m MV E velocity: 59.10 cm/s  Systemic Diam: 2.00 cm MV A velocity: 77.60 cm/s MV E/A ratio:  0.76  Stanly Leavens MD Electronically signed by Stanly Leavens MD Signature Date/Time: 03/14/2020/5:10:03 PM    Final   MONITORS  LONG TERM MONITOR (3-14 DAYS)  05/22/2020  Narrative  Patient had a minimum heart rate of 56 bpm, maximum heart rate of 146 bpm, and average heart rate of 91 bpm.  Predominant underlying rhythm was sinus rhythm.  Isolated PACs were rare (<1.0%), with rare couplets present.  Isolated PVCs were rare (<1.0%), with rare couplets present.  No evidence of complete heart block .  No triggered and diary events.  No malignant arrhythmias.  CT SCANS  CT CORONARY MORPH W/CTA COR W/SCORE 03/15/2020  Addendum 03/15/2020 10:45 AM ADDENDUM REPORT: 03/15/2020 10:42  CLINICAL DATA:  53 Year-old African American Female  EXAM: Cardiac/Coronary  CTA  TECHNIQUE: The patient was scanned on a [employer].  FINDINGS: A 100 kV prospective scan was triggered in the descending thoracic aorta at 111 HU's. Axial non-contrast 3 mm slices were carried out through the heart. The data  set was analyzed on a dedicated work station and scored using the [employer] . Gantry rotation speed was 250 msecs and collimation was .6 mm. No beta blockade and 0.8 mg of sl NTG was given. The 3D data set was reconstructed in 5% intervals of the 67-82 % of the R-R cycle. Diastolic phases were analyzed on a dedicated work station using MPR, MIP and VRT modes. The patient received 80 cc of contrast.  Aorta: Ascending aorta borderline dilation: 38.5 mm with ascending aortic index 2.3. No calcifications. No dissection.  Aortic Valve:  Tri-leaflet.  No calcifications.  Coronary Arteries:  Normal coronary origin.  Right dominance.  Coronary calcium  score of 0. This was 1st percentile for age, sex, and race matched control.  RCA is a large dominant artery that gives rise to PDA and PLA. There is no plaque.  Left main is a large artery that gives rise to LAD and LCX arteries. There is no plaque.  LAD is a large, tortuous vessel that gives rise to a large D1 branch and has no plaque.  LCX is a non-dominant artery is tortuous and gives  that gives rise to one large OM1 branch and a small OM2 vessel. The OM vessel runs near the diagonal vessel but there is no fistula seen. There is no plaque.  Other findings:  Normal pulmonary vein drainage into the left atrium.  Normal left atrial appendage without a thrombus.  Normal size of the pulmonary artery.  Extra-cardiac findings: See attached radiology report for non-cardiac structures.  IMPRESSION: 1. Coronary calcium  score of 0. This was 1st percentile for age, sex, and race matched control.  2. Normal coronary origin with right dominance.  3. Ascending aorta borderline dilation: 38.5 mm with ascending aortic index 2.3.  4. Tortuous coronary arteries. CAD-RADS 0. No evidence of CAD (0%). Consider non-atherosclerotic causes of chest pain.   Electronically Signed By: Stanly Leavens MD On: 03/15/2020 10:42  Narrative EXAM: OVER-READ INTERPRETATION  CT CHEST  The following report is an over-read performed by radiologist Dr. Rockey Kilts of [facility] on 03/15/2020. This over-read does not include interpretation of cardiac or coronary anatomy or pathology. The coronary CTA interpretation by the cardiologist is attached.  COMPARISON:  05/14/2016 chest radiograph.  FINDINGS: Vascular: Upper normal ascending aortic size including at the sinotubular junction measuring 3.8 cm on 17/8. 3.8 cm in the mid ascending segment including on 09/08. Based on sagittal reformats, 3.8 cm in the mid ascending segment on image 71.  No central pulmonary embolism, on this non-dedicated study.  Mediastinum/Nodes: No imaged thoracic adenopathy.  Lungs/Pleura: No pleural fluid. Calcified granuloma at the anterior right lung base on 30/12.  Upper Abdomen: Normal imaged portions of the liver, spleen, stomach.  Musculoskeletal: No acute osseous abnormality.  IMPRESSION: 1.  No acute findings in the imaged extracardiac chest. 2. Upper normal ascending  aortic size, 3.8 cm.  Electronically Signed: By: Rockey Kilts M.D. On: 03/15/2020 08:52              Recent Labs: 11/14/2022: ALT 26; BUN 12; Creatinine, Ser 0.81; Potassium 3.6; Sodium 145  Recent Lipid Panel    Component Value Date/Time   CHOL 140 04/01/2022 0806   TRIG 148 04/01/2022 0806   HDL 52 04/01/2022 0806   CHOLHDL 2.7 04/01/2022 0806   CHOLHDL 4.2 09/10/2017 0959   VLDL 26 09/10/2017 0959   LDLCALC 63 04/01/2022 0806      Physical Exam:    VS:  BP (!) 118/100 (BP Location: Left Arm)   Pulse 71   Ht 5' 7 (1.702 m)   Wt 172 lb (78 kg)   SpO2 97%   BMI 26.94 kg/m     Wt Readings from Last 3 Encounters:  03/03/23 172 lb (78 kg)  12/19/22 170 lb (77.1 kg)  11/14/22 173 lb 9.6 oz (78.7 kg)    GEN:  Well nourished, well developed in no acute distress HEENT: Normal NECK: No JVD CARDIAC: RRR, no murmurs, rubs, gallops RESPIRATORY:  Clear to auscultation without rales, wheezing or rhonchi  ABDOMEN: Soft, non-tender, non-distended NEUROLOGIC:  Alert and oriented x 3 PSYCHIATRIC:  Normal affect   ASSESSMENT:    1. Essential hypertension   2. Hyperlipidemia, unspecified hyperlipidemia type   3. Familial hypercholesterolemia   4. Dizzy      PLAN:    Hypertension Hypertension with recent increase in blood pressure, likely related to increased NSAID use and stress. Dizziness likely secondary to elevated blood pressure. Discussed potential causes and need for possible medication adjustment if stress improves to avoid hypotension. - Check BMP today - Monitor blood pressure at home every other day - If diastolic pressure remains above 80, increase losartan  to 75 mg - Repeat labs in two weeks  Borderline Thoracic Aortic Dilation - No FHX of aortopathy; assuming BSA of 1.9 m2 Ascending aorta is at the ULN; there are no guidelines that suggest routing imaging is recommended.  If this changes may need yearly imaging  First Degree Heart Block First degree  heart block noted on EKG. No symptoms or need for pacemaker. Common finding with aging, no intervention required.  Familial Hypercholesterolemia Cholesterol levels improved significantly since initial visit, with over 100-point reduction. Primary care can manage cholesterol medication effectively. - Continue current cholesterol medication  General Health Maintenance Discussed overall health maintenance including stress management and importance of regular follow-ups with primary care. - Graduation to primary care with cardiology as needed, unless PCP recommends additional support  Medication Adjustments/Labs and Tests Ordered: Current medicines are reviewed at length with the patient today.  Concerns regarding medicines are outlined above.  Orders Placed This Encounter  Procedures   Basic metabolic panel   EKG 12-Lead   No orders of the defined types were placed in this encounter.   Patient Instructions  Medication Instructions:  Your physician recommends that you continue on your current medications as directed. Please refer to the Current Medication list given to you today.  *If you need a refill on your cardiac medications before your next appointment, please call your pharmacy*   Lab Work: BMP  If you have labs (blood work) drawn today and your tests are completely normal, you will receive your results only by: MyChart Message (if you have MyChart) OR A paper copy in the mail If you have any lab test that is abnormal or we need to change your treatment, we will call you to review the results.   Testing/Procedures: NONE   Follow-Up:AS NEEDED  At [facility], you and your health needs are our priority.  As part of our continuing mission to provide you with exceptional heart care, we have created designated Provider Care Teams.  These Care Teams include your primary Cardiologist (physician) and Advanced Practice Providers (APPs -  Physician Assistants and Nurse  Practitioners) who all work together to provide you with the care you need, when you need it.  Provider:   Stanly Leavens, MD  Signed, Stanly DELENA Leavens, MD  03/03/2023 9:30 AM    [facility]

## 2023-03-03 NOTE — Patient Instructions (Signed)
 Medication Instructions:  Your physician recommends that you continue on your current medications as directed. Please refer to the Current Medication list given to you today.  *If you need a refill on your cardiac medications before your next appointment, please call your pharmacy*   Lab Work: BMP  If you have labs (blood work) drawn today and your tests are completely normal, you will receive your results only by: MyChart Message (if you have MyChart) OR A paper copy in the mail If you have any lab test that is abnormal or we need to change your treatment, we will call you to review the results.   Testing/Procedures: NONE   Follow-Up:AS NEEDED  At [facility], you and your health needs are our priority.  As part of our continuing mission to provide you with exceptional heart care, we have created designated Provider Care Teams.  These Care Teams include your primary Cardiologist (physician) and Advanced Practice Providers (APPs -  Physician Assistants and Nurse Practitioners) who all work together to provide you with the care you need, when you need it.  Provider:   Stanly Leavens, MD

## 2023-03-15 ENCOUNTER — Other Ambulatory Visit: Payer: Self-pay | Admitting: Internal Medicine

## 2023-05-14 ENCOUNTER — Ambulatory Visit: Payer: Managed Care, Other (non HMO) | Attending: Family Medicine | Admitting: Family Medicine

## 2023-05-14 ENCOUNTER — Encounter: Payer: Self-pay | Admitting: Family Medicine

## 2023-05-14 VITALS — BP 120/83 | HR 76 | Ht 67.0 in | Wt 178.2 lb

## 2023-05-14 DIAGNOSIS — M47896 Other spondylosis, lumbar region: Secondary | ICD-10-CM | POA: Diagnosis not present

## 2023-05-14 DIAGNOSIS — N951 Menopausal and female climacteric states: Secondary | ICD-10-CM

## 2023-05-14 DIAGNOSIS — I10 Essential (primary) hypertension: Secondary | ICD-10-CM | POA: Diagnosis not present

## 2023-05-14 DIAGNOSIS — R7303 Prediabetes: Secondary | ICD-10-CM

## 2023-05-14 DIAGNOSIS — R140 Abdominal distension (gaseous): Secondary | ICD-10-CM

## 2023-05-14 DIAGNOSIS — K580 Irritable bowel syndrome with diarrhea: Secondary | ICD-10-CM

## 2023-05-14 LAB — POCT GLYCOSYLATED HEMOGLOBIN (HGB A1C): HbA1c, POC (prediabetic range): 6.2 % (ref 5.7–6.4)

## 2023-05-14 MED ORDER — DICYCLOMINE HCL 10 MG PO CAPS: 10.0000 mg | ORAL_CAPSULE | Freq: Three times a day (TID) | ORAL | 2 refills | Status: DC

## 2023-05-14 MED ORDER — HYDROCHLOROTHIAZIDE 25 MG PO TABS: 25.0000 mg | ORAL_TABLET | Freq: Every day | ORAL | 1 refills | Status: DC

## 2023-05-14 MED ORDER — GABAPENTIN 100 MG PO CAPS
ORAL_CAPSULE | ORAL | 1 refills | Status: DC
Start: 2023-05-14 — End: 2023-11-12

## 2023-05-14 MED ORDER — LOSARTAN POTASSIUM 50 MG PO TABS: 50.0000 mg | ORAL_TABLET | Freq: Every day | ORAL | 1 refills | Status: DC

## 2023-05-14 MED ORDER — CYCLOBENZAPRINE HCL 10 MG PO TABS: 10.0000 mg | ORAL_TABLET | Freq: Two times a day (BID) | ORAL | 2 refills | Status: DC | PRN

## 2023-05-14 NOTE — Patient Instructions (Signed)
 VISIT SUMMARY:  During today's visit, we discussed your symptoms of bloating, your IBS with diarrhea, prediabetes, hypertension, and chronic back pain. We also reviewed your general health maintenance needs.  YOUR PLAN:  -BLOATING: Your bloating may be due to menopausal symptoms or a Helicobacter pylori infection, which is a type of bacteria that can cause stomach issues. We will test for Helicobacter pylori, and if the test is positive, you will take antibiotics for two weeks. If the test is negative, we will manage your symptoms with dietary changes and lifestyle adjustments.  -IRRITABLE BOWEL SYNDROME (IBS) WITH DIARRHEA: IBS is a condition that affects your digestive system, causing symptoms like diarrhea, which can be triggered by certain foods and stress. We have prescribed dicyclomine to be taken twice a day with meals as needed. Adjust the dosage based on your symptoms to avoid constipation.  -PREDIABETES: Prediabetes means your blood sugar levels are higher than normal but not high enough to be classified as diabetes. Your A1c level is 6.2. We discussed the importance of lifestyle changes to prevent the progression to diabetes.  -HYPERTENSION: Hypertension, or high blood pressure, is well-controlled with your current medication. Continue taking hydrochlorothiazide and losartan as prescribed. Your condition is stable according to your cardiologist.  -CHRONIC BACK PAIN: Chronic back pain is being managed well with your current treatment plan. Continue taking cyclobenzaprine as prescribed and keep up with your back exercises and yoga.  -GENERAL HEALTH MAINTENANCE: We discussed the need for a cholesterol screening since your last test was over a year ago. We will plan to do this at your next visit, and you should be in a fasting state for the test.  INSTRUCTIONS:  Please schedule a follow-up appointment in six months. We will communicate the results of your Helicobacter pylori test via  MyChart.

## 2023-05-14 NOTE — Progress Notes (Signed)
 Subjective:  Patient ID: Brandy [name], female    DOB: [birth year]  Age: 53 y.o. MRN: [id number]  CC: Medical Management of Chronic Issues (Feels bloated.)     Discussed the use of AI scribe software for clinical note transcription with the patient, who gave verbal consent to proceed.  History of Present Illness The patient, with a history of hypertension, hyperlipidemia, anxiety and depression IBS, presents with bloating and a sensation of fullness after eating small amounts of food. The patient reports this has been occurring every other day and describes the sensation as feeling "like I'm about to burst." The patient denies any associated nausea, vomiting, epigastric pain, or heartburn. The patient's IBS symptoms, including diarrhea, are intermittent and seem to be triggered by certain foods and stress levels.   She is doing well on her statin and her antihypertensive.  She saw her cardiologist 1 month ago and per notes she can follow-up as needed. The patient also reports issues with both of her knees and back, which are managed with pain medication as needed. The patient has not been exercising due to knee pain.  She recently had gel injections in her knee and is not sure if it has been effective. She no longer takes Cymbalta which was prescribed for her depression and also to help with pain. Gabapentin was prescribed by GYN for management of vasomotor menopausal symptoms.   Past Medical History:  Diagnosis Date   Allergy    SEASONAL   Anxiety    Arthritis    Depression    Disc degeneration, lumbar 2011   Heartburn    Hyperlipidemia    Hypertension    IBS (irritable bowel syndrome)     Past Surgical History:  Procedure Laterality Date   RIGHT HEART CATH N/A 06/12/2020   Procedure: RIGHT HEART CATH;  Surgeon: Dolores Patty, MD;  Location: [facility];  Service: Cardiovascular;  Laterality: N/A;   TUBAL LIGATION      Family History  Problem Relation  Age of Onset   Breast cancer Mother 31       ER+   Thyroid disease Mother    Hyperlipidemia Mother    Diabetes Father    Hypertension Father        and aunt and uncles   Thyroid disease Father    Hyperlipidemia Father    Breast cancer Maternal Aunt 52   Hypertension Paternal Grandfather    Prostate cancer Paternal Grandfather    Stomach cancer Cousin    Colon cancer Neg Hx    Colon polyps Neg Hx    Esophageal cancer Neg Hx    Rectal cancer Neg Hx     Social History   Socioeconomic History   Marital status: Divorced    Spouse name: Not on file   Number of children: 3   Years of education: Not on file   Highest education level: 12th grade  Occupational History   Occupation: Company secretary  Tobacco Use   Smoking status: Never    Passive exposure: Never   Smokeless tobacco: Never  Vaping Use   Vaping status: Never Used  Substance and Sexual Activity   Alcohol use: Yes    Comment: social   Drug use: No   Sexual activity: Yes    Partners: Male    Birth control/protection: Surgical, None    Comment: BTL  Other Topics Concern   Not on file  Social History Narrative   Not on file  Social Drivers of Corporate investment banker Strain: Not on file  Food Insecurity: No Food Insecurity (03/21/2020)   Hunger Vital Sign    Worried About Running Out of Food in the Last Year: Never true    Ran Out of Food in the Last Year: Never true  Transportation Needs: No Transportation Needs (03/21/2020)   PRAPARE - Administrator, Civil Service (Medical): No    Lack of Transportation (Non-Medical): No  Physical Activity: Not on file  Stress: Not on file  Social Connections: Not on file    Allergies  Allergen Reactions   Apple Juice Hives    ANYTHING WITH APPLES IN IT    Outpatient Medications Prior to Visit  Medication Sig Dispense Refill   betamethasone dipropionate 0.05 % cream Apply topically 2 (two) times daily as needed (Rash). 45 g 11   ibuprofen (ADVIL) 600  MG tablet TAKE 1 TABLET (600 MG TOTAL) BY MOUTH 2 (TWO) TIMES DAILY AS NEEDED. 60 tablet 3   meloxicam (MOBIC) 15 MG tablet Take 15 mg by mouth daily as needed.     nitroGLYCERIN (NITROSTAT) 0.4 MG SL tablet Place 1 tablet (0.4 mg total) under the tongue every 5 (five) minutes as needed for chest pain. 25 tablet 3   NONFORMULARY OR COMPOUNDED ITEM Vitamin E vaginal cream 200u/ml.  One ml pv three times weekly. 36 each 3   Polyethyl Glycol-Propyl Glycol (SYSTANE) 0.4-0.3 % SOLN Place 1 drop into both eyes daily as needed (Dry eye).     rosuvastatin (CRESTOR) 20 MG tablet Take 1 tablet (20 mg total) by mouth daily. 90 tablet 3   SODIUM FLUORIDE 5000 PPM 1.1 % PSTE See admin instructions.     triamcinolone (NASACORT) 55 MCG/ACT AERO nasal inhaler Place 2 sprays into the nose daily. (Patient taking differently: Place 2 sprays into the nose daily as needed (Allergies).) 1 Inhaler 0   cyclobenzaprine (FLEXERIL) 10 MG tablet Take 1 tablet (10 mg total) by mouth 2 (two) times daily as needed. 60 tablet 2   gabapentin (NEURONTIN) 100 MG capsule Take 1- 2 capsule qhs prn hot flashes or insomnia 180 capsule 1   hydrochlorothiazide (HYDRODIURIL) 25 MG tablet Take 1 tablet (25 mg total) by mouth daily. 90 tablet 1   losartan (COZAAR) 50 MG tablet Take 1 tablet (50 mg total) by mouth daily. 90 tablet 1   omeprazole (PRILOSEC) 20 MG capsule Take 1 capsule (20 mg total) by mouth as needed. (Patient not taking: Reported on 05/14/2023) 90 capsule 0   DULoxetine (CYMBALTA) 30 MG capsule Take 1-2 capsules (30-60 mg total) by mouth daily. Start with 1 cap 30mg  for 2-3 weeks, if tolerating (Patient not taking: Reported on 05/14/2023) 60 capsule 0   hydrOXYzine (ATARAX) 25 MG tablet Take 1 tablet (25 mg total) by mouth 3 (three) times daily as needed for anxiety. (Patient not taking: Reported on 05/14/2023) 60 tablet 1   No facility-administered medications prior to visit.     ROS Review of Systems  Constitutional:   Negative for activity change and appetite change.  HENT:  Negative for sinus pressure and sore throat.   Respiratory:  Negative for chest tightness, shortness of breath and wheezing.   Cardiovascular:  Negative for chest pain and palpitations.  Gastrointestinal:  Negative for abdominal distention, abdominal pain and constipation.  Genitourinary: Negative.   Musculoskeletal: Negative.   Psychiatric/Behavioral:  Negative for behavioral problems and dysphoric mood.     Objective:  BP 120/83  Pulse 76   Ht 5\' 7"  (1.702 m)   Wt 178 lb 3.2 oz (80.8 kg)   SpO2 99%   BMI 27.91 kg/m      05/14/2023    4:31 PM 03/03/2023    9:01 AM 12/19/2022    3:10 PM  BP/Weight  Systolic BP 120 118 111  Diastolic BP 83 100 79  Wt. (Lbs) 178.2 172 170  BMI 27.91 kg/m2 26.94 kg/m2 26.63 kg/m2      Physical Exam Constitutional:      Appearance: She is well-developed.  Cardiovascular:     Rate and Rhythm: Normal rate.     Heart sounds: Normal heart sounds. No murmur heard. Pulmonary:     Effort: Pulmonary effort is normal.     Breath sounds: Normal breath sounds. No wheezing or rales.  Chest:     Chest wall: No tenderness.  Abdominal:     General: Bowel sounds are normal. There is no distension.     Palpations: Abdomen is soft. There is no mass.     Tenderness: There is no abdominal tenderness.  Musculoskeletal:        General: Normal range of motion.     Right lower leg: No edema.     Left lower leg: No edema.  Neurological:     Mental Status: She is alert and oriented to person, place, and time.  Psychiatric:        Mood and Affect: Mood normal.        Latest Ref Rng & Units 03/03/2023    9:40 AM 11/14/2022    3:58 PM 04/01/2022    8:06 AM  CMP  Glucose 70 - 99 mg/dL 97  161    BUN 6 - 24 mg/dL 16  12    Creatinine 0.96 - 1.00 mg/dL 0.45  4.09    Sodium 811 - 144 mmol/L 141  145    Potassium 3.5 - 5.2 mmol/L 4.0  3.6    Chloride 96 - 106 mmol/L 104  101    CO2 20 - 29 mmol/L  29  29    Calcium 8.7 - 10.2 mg/dL 91.4  9.7    Total Protein 6.0 - 8.5 g/dL  7.1    Total Bilirubin 0.0 - 1.2 mg/dL  0.3    Alkaline Phos 44 - 121 IU/L  83    AST 0 - 40 IU/L  21    ALT 0 - 32 IU/L  26  22     Lipid Panel     Component Value Date/Time   CHOL 140 04/01/2022 0806   TRIG 148 04/01/2022 0806   HDL 52 04/01/2022 0806   CHOLHDL 2.7 04/01/2022 0806   CHOLHDL 4.2 09/10/2017 0959   VLDL 26 09/10/2017 0959   LDLCALC 63 04/01/2022 0806    CBC    Component Value Date/Time   WBC 7.2 01/03/2021 1625   WBC 6.6 06/09/2019 1232   RBC 5.33 (H) 01/03/2021 1625   RBC 4.97 06/09/2019 1232   HGB 15.7 01/03/2021 1625   HCT 46.5 01/03/2021 1625   PLT 297 01/03/2021 1625   MCV 87 01/03/2021 1625   MCH 29.5 01/03/2021 1625   MCH 30.6 05/14/2016 1418   MCHC 33.8 01/03/2021 1625   MCHC 33.3 06/09/2019 1232   RDW 13.8 01/03/2021 1625   LYMPHSABS 1.9 01/03/2021 1625   MONOABS 0.4 06/09/2019 1232   EOSABS 0.0 01/03/2021 1625   BASOSABS 0.0 01/03/2021 1625    Lab Results  Component Value Date   HGBA1C 6.2 05/14/2023       Assessment & Plan Bloating Intermittent bloating likely due to menopausal symptoms or Helicobacter pylori infection. Discussed hormonal changes and Helicobacter pylori's role in bloating. - Order Helicobacter pylori test. - If positive, treat with antibiotics for two weeks. - If negative, manage with dietary modifications and lifestyle changes.  Irritable Bowel Syndrome (IBS) with Diarrhea IBS with diarrhea triggered by diet and stress. Discussed dicyclomine use and constipation risk. - Prescribe dicyclomine with meals as needed. - Adjust dosage based on symptoms to avoid constipation.  Prediabetes A1c increased to 6.2 from 6.0. Discussed lifestyle modifications to prevent diabetes progression. - Encourage lifestyle modifications.  Hypertension Blood pressure well-controlled with current medication. Stable condition per cardiology. - Continue  hydrochlorothiazide and losartan. - Refill prescriptions.  Chronic Back Pain Chronic back pain well-managed with current regimen. Encouraged continued exercises and yoga. - Refill cyclobenzaprine prescription. - Encourage back exercises and yoga.   Vasomotor menopausal symptoms -Remains on gabapentin.   General Health Maintenance Discussed need for cholesterol screening. Last test over a year ago. - Plan cholesterol screening at next visit, ensure fasting state.  Follow-up Plan for follow-up in six months. Communicate Helicobacter pylori test results via MyChart. - Schedule follow-up in six months. - Communicate Helicobacter pylori test results via MyChart.      Meds ordered this encounter  Medications   cyclobenzaprine (FLEXERIL) 10 MG tablet    Sig: Take 1 tablet (10 mg total) by mouth 2 (two) times daily as needed.    Dispense:  60 tablet    Refill:  2   gabapentin (NEURONTIN) 100 MG capsule    Sig: Take 1- 2 capsule qhs prn hot flashes or insomnia    Dispense:  180 capsule    Refill:  1   hydrochlorothiazide (HYDRODIURIL) 25 MG tablet    Sig: Take 1 tablet (25 mg total) by mouth daily.    Dispense:  90 tablet    Refill:  1   losartan (COZAAR) 50 MG tablet    Sig: Take 1 tablet (50 mg total) by mouth daily.    Dispense:  90 tablet    Refill:  1   dicyclomine (BENTYL) 10 MG capsule    Sig: Take 1 capsule (10 mg total) by mouth 3 (three) times daily before meals.    Dispense:  90 capsule    Refill:  2    Follow-up: Return in about 6 months (around 11/14/2023) for Chronic medical conditions.       Hoy Register, MD, FAAFP. [facility] [place], [place] [phone]   05/14/2023, 4:51 PM

## 2023-05-16 ENCOUNTER — Encounter: Payer: Self-pay | Admitting: Family Medicine

## 2023-05-16 LAB — H. PYLORI BREATH TEST: H pylori Breath Test: NEGATIVE

## 2023-05-16 LAB — H. PYLORI BREATH COLLECTION

## 2023-09-03 ENCOUNTER — Ambulatory Visit (HOSPITAL_BASED_OUTPATIENT_CLINIC_OR_DEPARTMENT_OTHER): Admitting: Obstetrics & Gynecology

## 2023-09-03 VITALS — BP 109/84 | HR 80 | Wt 170.0 lb

## 2023-09-03 DIAGNOSIS — R5382 Chronic fatigue, unspecified: Secondary | ICD-10-CM

## 2023-09-03 DIAGNOSIS — N912 Amenorrhea, unspecified: Secondary | ICD-10-CM

## 2023-09-03 DIAGNOSIS — N951 Menopausal and female climacteric states: Secondary | ICD-10-CM

## 2023-09-03 NOTE — Progress Notes (Signed)
 CC:   menopausal symptoms  HPI: 53 y.o. G26P2003 Divorced Black or [place] American female here for discussion of menopausal symptoms.  She's struggling with focus, brain fog, irritability, hot flashes, some sleep issues.  Gabapentin  has helped hot flashes and night sweats.  She is also having some bloating symptoms that feel like they are a part of her menopausal symptoms.  She knows this isn't one of the more common symptoms.  She did a breath test that looked for h pylori and it was negative. Lastly, libido is a concern.  She has tried gabapentin  as well as estratest.  She's not really sure why she stopped this.  But her mother did have breast cancer and it was ER+.  Her mother was on HRT when she developed her breast cancer so she has had some concerns about being on HRT.  At this point with her symptoms, she really does want to consider HRT again.    She does not think her mother had genetic testing but will discussed.  Offered referral for pt to have this if she desires.    Pt did have some cardiac symptoms a few years ago but ultimately had a cardiac cath and this was negative.     Past Medical History:  Diagnosis Date   Allergy    SEASONAL   Anxiety    Arthritis    Depression    Disc degeneration, lumbar 2011   Heartburn    Hyperlipidemia    Hypertension    IBS (irritable bowel syndrome)     MEDS:   Current Outpatient Medications on File Prior to Visit  Medication Sig Dispense Refill   betamethasone  dipropionate 0.05 % cream Apply topically 2 (two) times daily as needed (Rash). 45 g 11   Cetirizine  HCl (ZYRTEC  ALLERGY PO) Take by mouth.     cyclobenzaprine  (FLEXERIL ) 10 MG tablet Take 1 tablet (10 mg total) by mouth 2 (two) times daily as needed. 60 tablet 2   dicyclomine  (BENTYL ) 10 MG capsule Take 1 capsule (10 mg total) by mouth 3 (three) times daily before meals. 90 capsule 2   gabapentin  (NEURONTIN ) 100 MG capsule Take 1- 2 capsule qhs prn hot flashes or insomnia 180  capsule 1   hydrochlorothiazide  (HYDRODIURIL ) 25 MG tablet Take 1 tablet (25 mg total) by mouth daily. 90 tablet 1   ibuprofen  (ADVIL ) 600 MG tablet TAKE 1 TABLET (600 MG TOTAL) BY MOUTH 2 (TWO) TIMES DAILY AS NEEDED. 60 tablet 3   losartan  (COZAAR ) 50 MG tablet Take 1 tablet (50 mg total) by mouth daily. 90 tablet 1   meloxicam  (MOBIC ) 15 MG tablet Take 15 mg by mouth daily as needed.     nitroGLYCERIN  (NITROSTAT ) 0.4 MG SL tablet Place 1 tablet (0.4 mg total) under the tongue every 5 (five) minutes as needed for chest pain. 25 tablet 3   NONFORMULARY OR COMPOUNDED ITEM Vitamin E vaginal cream 200u/ml.  One ml pv three times weekly. 36 each 3   omeprazole  (PRILOSEC) 20 MG capsule Take 1 capsule (20 mg total) by mouth as needed. 90 capsule 0   Polyethyl Glycol-Propyl Glycol (SYSTANE) 0.4-0.3 % SOLN Place 1 drop into both eyes daily as needed (Dry eye).     rosuvastatin  (CRESTOR ) 20 MG tablet Take 1 tablet (20 mg total) by mouth daily. 90 tablet 3   SODIUM FLUORIDE 5000 PPM 1.1 % PSTE See admin instructions.     triamcinolone  (NASACORT ) 55 MCG/ACT AERO nasal inhaler Place 2 sprays  into the nose daily. (Patient taking differently: Place 2 sprays into the nose daily as needed (Allergies).) 1 Inhaler 0   No current facility-administered medications on file prior to visit.    ALLERGIES: Apple juice  SH:  divorced, non smoker  Review of Systems  Constitutional:  Positive for malaise/fatigue.       Hot flashes  Psychiatric/Behavioral:         Brain fog    PHYSICAL EXAMINATION:    BP 109/84 (BP Location: Right Arm, Patient Position: Sitting, Cuff Size: Large)   Pulse 80   Wt 170 lb (77.1 kg)   SpO2 98%   BMI 26.63 kg/m     Physical Exam Constitutional:      Appearance: Normal appearance.  Neurological:     General: No focal deficit present.     Mental Status: She is alert.  Psychiatric:        Mood and Affect: Mood normal.      Assessment/Plan: 1. Hot flashes due to menopause  (Primary) - we discussed options.  Will draw some blood work first.  We did discuss dosages and would use vivelle  dot 0.075mg  patch if we decide to start HRT.  Risks reviewed.    2. Amenorrhea - Estradiol  - Testosterone , Total, LC/MS/MS  3. Chronic fatigue - B12 - VITAMIN D  25 Hydroxy (Vit-D Deficiency, Fractures) - CBC - Ferritin

## 2023-09-06 ENCOUNTER — Encounter (HOSPITAL_BASED_OUTPATIENT_CLINIC_OR_DEPARTMENT_OTHER): Payer: Self-pay | Admitting: Obstetrics & Gynecology

## 2023-09-06 LAB — CBC
Hematocrit: 46.2 % (ref 34.0–46.6)
Hemoglobin: 14.7 g/dL (ref 11.1–15.9)
MCH: 28.3 pg (ref 26.6–33.0)
MCHC: 31.8 g/dL (ref 31.5–35.7)
MCV: 89 fL (ref 79–97)
Platelets: 275 x10E3/uL (ref 150–450)
RBC: 5.2 x10E6/uL (ref 3.77–5.28)
RDW: 14.3 % (ref 11.7–15.4)
WBC: 6.3 x10E3/uL (ref 3.4–10.8)

## 2023-09-06 LAB — VITAMIN B12: Vitamin B-12: 380 pg/mL (ref 232–1245)

## 2023-09-06 LAB — FERRITIN: Ferritin: 184 ng/mL — ABNORMAL HIGH (ref 15–150)

## 2023-09-06 LAB — TESTOSTERONE, TOTAL, LC/MS/MS: Testosterone, total: 30.2 ng/dL

## 2023-09-06 LAB — ESTRADIOL: Estradiol: 11 pg/mL

## 2023-09-06 LAB — VITAMIN D 25 HYDROXY (VIT D DEFICIENCY, FRACTURES): Vit D, 25-Hydroxy: 31.5 ng/mL (ref 30.0–100.0)

## 2023-09-15 ENCOUNTER — Other Ambulatory Visit (HOSPITAL_BASED_OUTPATIENT_CLINIC_OR_DEPARTMENT_OTHER): Payer: Self-pay | Admitting: Obstetrics & Gynecology

## 2023-09-15 DIAGNOSIS — R5383 Other fatigue: Secondary | ICD-10-CM

## 2023-09-15 DIAGNOSIS — N951 Menopausal and female climacteric states: Secondary | ICD-10-CM

## 2023-09-15 MED ORDER — ESTRADIOL 0.075 MG/24HR TD PTTW: 1.0000 | MEDICATED_PATCH | TRANSDERMAL | 2 refills | Status: DC

## 2023-09-16 ENCOUNTER — Other Ambulatory Visit (HOSPITAL_BASED_OUTPATIENT_CLINIC_OR_DEPARTMENT_OTHER): Payer: Self-pay | Admitting: Obstetrics & Gynecology

## 2023-09-16 ENCOUNTER — Other Ambulatory Visit (HOSPITAL_BASED_OUTPATIENT_CLINIC_OR_DEPARTMENT_OTHER): Payer: Self-pay

## 2023-09-16 DIAGNOSIS — R5383 Other fatigue: Secondary | ICD-10-CM

## 2023-09-17 ENCOUNTER — Ambulatory Visit (HOSPITAL_BASED_OUTPATIENT_CLINIC_OR_DEPARTMENT_OTHER): Payer: Self-pay | Admitting: Obstetrics & Gynecology

## 2023-09-17 LAB — TSH RFX ON ABNORMAL TO FREE T4: TSH: 2.19 u[IU]/mL (ref 0.450–4.500)

## 2023-09-17 LAB — C-REACTIVE PROTEIN: CRP: <1 mg/L (ref 0–10)

## 2023-09-24 ENCOUNTER — Ambulatory Visit (HOSPITAL_BASED_OUTPATIENT_CLINIC_OR_DEPARTMENT_OTHER): Payer: Self-pay | Admitting: Obstetrics & Gynecology

## 2023-09-25 MED ORDER — PROGESTERONE 200 MG PO CAPS: 200.0000 mg | ORAL_CAPSULE | Freq: Every day | ORAL | 1 refills | Status: DC

## 2023-10-11 ENCOUNTER — Other Ambulatory Visit (HOSPITAL_BASED_OUTPATIENT_CLINIC_OR_DEPARTMENT_OTHER): Payer: Self-pay | Admitting: Obstetrics & Gynecology

## 2023-10-11 DIAGNOSIS — N951 Menopausal and female climacteric states: Secondary | ICD-10-CM

## 2023-10-21 ENCOUNTER — Other Ambulatory Visit (HOSPITAL_BASED_OUTPATIENT_CLINIC_OR_DEPARTMENT_OTHER): Payer: Self-pay

## 2023-10-21 ENCOUNTER — Emergency Department (HOSPITAL_BASED_OUTPATIENT_CLINIC_OR_DEPARTMENT_OTHER)
Admission: EM | Admit: 2023-10-21 | Discharge: 2023-10-21 | Disposition: A | Discharge status: Home / Self Care | Attending: Emergency Medicine | Admitting: Emergency Medicine

## 2023-10-21 ENCOUNTER — Other Ambulatory Visit: Payer: Self-pay

## 2023-10-21 ENCOUNTER — Encounter (HOSPITAL_BASED_OUTPATIENT_CLINIC_OR_DEPARTMENT_OTHER): Payer: Self-pay

## 2023-10-21 DIAGNOSIS — E876 Hypokalemia: Secondary | ICD-10-CM | POA: Insufficient documentation

## 2023-10-21 DIAGNOSIS — Z79899 Other long term (current) drug therapy: Secondary | ICD-10-CM | POA: Diagnosis not present

## 2023-10-21 DIAGNOSIS — M545 Low back pain, unspecified: Secondary | ICD-10-CM | POA: Diagnosis present

## 2023-10-21 DIAGNOSIS — I10 Essential (primary) hypertension: Secondary | ICD-10-CM | POA: Diagnosis not present

## 2023-10-21 LAB — URINALYSIS, ROUTINE W REFLEX MICROSCOPIC
Bacteria, UA: NONE SEEN
Bilirubin Urine: NEGATIVE
Glucose, UA: NEGATIVE mg/dL
Ketones, ur: NEGATIVE mg/dL
Leukocytes,Ua: NEGATIVE
Nitrite: NEGATIVE
Protein, ur: NEGATIVE mg/dL
Specific Gravity, Urine: 1.019 (ref 1.005–1.030)
pH: 5.5 (ref 5.0–8.0)

## 2023-10-21 LAB — BASIC METABOLIC PANEL WITH GFR
Anion gap: 13 (ref 5–15)
BUN: 10 mg/dL (ref 6–20)
CO2: 26 mmol/L (ref 22–32)
Calcium: 10.4 mg/dL — ABNORMAL HIGH (ref 8.9–10.3)
Chloride: 100 mmol/L (ref 98–111)
Creatinine, Ser: 0.85 mg/dL (ref 0.44–1.00)
GFR, Estimated: >60 mL/min (ref >60)
Glucose, Bld: 106 mg/dL — ABNORMAL HIGH (ref 70–99)
Potassium: 3.4 mmol/L — ABNORMAL LOW (ref 3.5–5.1)
Sodium: 139 mmol/L (ref 135–145)

## 2023-10-21 LAB — CBC WITH DIFFERENTIAL/PLATELET
Abs Immature Granulocytes: 0.03 K/uL (ref 0.00–0.07)
Basophils Absolute: 0 K/uL (ref 0.0–0.1)
Basophils Relative: 0 %
Eosinophils Absolute: 0.1 K/uL (ref 0.0–0.5)
Eosinophils Relative: 1 %
HCT: 45.4 % (ref 36.0–46.0)
Hemoglobin: 15 g/dL (ref 12.0–15.0)
Immature Granulocytes: 0 %
Lymphocytes Relative: 22 %
Lymphs Abs: 1.6 K/uL (ref 0.7–4.0)
MCH: 28.6 pg (ref 26.0–34.0)
MCHC: 33 g/dL (ref 30.0–36.0)
MCV: 86.5 fL (ref 80.0–100.0)
Monocytes Absolute: 0.5 K/uL (ref 0.1–1.0)
Monocytes Relative: 7 %
Neutro Abs: 5.2 K/uL (ref 1.7–7.7)
Neutrophils Relative %: 70 %
Platelets: 323 K/uL (ref 150–400)
RBC: 5.25 MIL/uL — ABNORMAL HIGH (ref 3.87–5.11)
RDW: 14.9 % (ref 11.5–15.5)
WBC: 7.4 K/uL (ref 4.0–10.5)
nRBC: 0 % (ref 0.0–0.2)

## 2023-10-21 MED ORDER — KETOROLAC TROMETHAMINE 15 MG/ML IJ SOLN
15.0000 mg | Freq: Once | INTRAMUSCULAR | Status: AC
  Administered 2023-10-21: 15 mg via INTRAVENOUS
  Filled 2023-10-21: qty 1

## 2023-10-21 MED ORDER — HYDROMORPHONE HCL 1 MG/ML IJ SOLN
1.0000 mg | Freq: Once | INTRAMUSCULAR | Status: AC
  Administered 2023-10-21: 1 mg via INTRAVENOUS
  Filled 2023-10-21: qty 1

## 2023-10-21 MED ORDER — LIDOCAINE 5 % EX PTCH
2.0000 | MEDICATED_PATCH | CUTANEOUS | Status: DC
  Administered 2023-10-21: 2 via TRANSDERMAL

## 2023-10-21 MED ORDER — HYDROCODONE-ACETAMINOPHEN 5-325 MG PO TABS
1.0000 | ORAL_TABLET | Freq: Four times a day (QID) | ORAL | 0 refills | Status: AC | PRN
  Filled 2023-10-21: qty 8, 2d supply, fill #0

## 2023-10-21 MED ORDER — ONDANSETRON HCL 4 MG/2ML IJ SOLN
4.0000 mg | Freq: Once | INTRAMUSCULAR | Status: AC
  Administered 2023-10-21: 4 mg via INTRAVENOUS
  Filled 2023-10-21: qty 2

## 2023-10-21 MED ORDER — METHOCARBAMOL 500 MG PO TABS
500.0000 mg | ORAL_TABLET | Freq: Two times a day (BID) | ORAL | 0 refills | Status: AC
  Filled 2023-10-21: qty 20, 10d supply, fill #0

## 2023-10-21 MED ORDER — MORPHINE SULFATE (PF) 4 MG/ML IV SOLN
4.0000 mg | Freq: Once | INTRAVENOUS | Status: AC
  Administered 2023-10-21: 4 mg via INTRAVENOUS
  Filled 2023-10-21: qty 1

## 2023-10-21 MED ORDER — METHOCARBAMOL 500 MG PO TABS
500.0000 mg | ORAL_TABLET | Freq: Once | ORAL | Status: AC
Start: 2023-10-21 — End: 2023-10-21
  Administered 2023-10-21: 500 mg via ORAL
  Filled 2023-10-21: qty 1

## 2023-10-21 MED ORDER — SODIUM CHLORIDE 0.9 % IV BOLUS
1000.0000 mL | Freq: Once | INTRAVENOUS | Status: AC
  Administered 2023-10-21: 1000 mL via INTRAVENOUS

## 2023-10-21 NOTE — ED Triage Notes (Signed)
 Arrives POV with complaints of low back pain that started today. No falls or injuries. Patient states that her back pain is recurrent. Rates pain an 8/10. Took a muscle relaxant prior to arrival.

## 2023-10-21 NOTE — ED Notes (Signed)
 Pt ambulatory to restroom slowly without assistance. Pt's daughter went with her for assistance if needed.

## 2023-10-21 NOTE — ED Notes (Signed)
 Pt reported she could give small sample at this time. NT went to assist pt to bathroom and pt denied urge to urinate. Pt unable to provide sample at this time.

## 2023-10-21 NOTE — ED Notes (Signed)
 Hot pack applied to lower back to assist with pain.

## 2023-10-21 NOTE — ED Notes (Signed)
 Pt stated she is not able to give urine sample at this time.

## 2023-10-21 NOTE — Discharge Instructions (Signed)
 It was a pleasure taking care of you today.  As discussed, your workup is reassuring.  I am sending you home with pain medication.  Take as needed for pain.  Medication can cause drowsiness so do not drive or operate machinery while on the medication.  Please follow-up with PCP if symptoms do not improve.  Return to the ER for any worsening symptoms.

## 2023-10-21 NOTE — ED Notes (Signed)
 Pt aware of need for urine sample, pt unable to provide at this time.

## 2023-10-21 NOTE — ED Provider Notes (Signed)
 Brandy [name] AT [facility] Provider Note   CSN: [id number] Arrival date & time: 10/21/23  9148     Patient presents with: Back Pain   Brandy [name] is a 53 y.o. female with a past medical history significant for anxiety, depression, hypertension, hyperlipidemia, and IBS who presents to the ED due to bilateral low back pain that started this morning.  Denies saddle anesthesia, bowel/bladder incontinence, lower extremity numbness/tingling, and lower extremity weakness.  Does have a history of intermittent low back pain.  Not on any chronic pain medication.  Takes muscle relaxers intermittently for low back pain.  Denies any recent injury to low back.  No history of cancer.  Denies fever and chills.  No history of IV drug use.  Denies abdominal pain.  No urinary symptoms. Pain is nonradiating.   History obtained from patient and past medical records. No interpreter used during encounter.       Prior to Admission medications   Medication Sig Start Date End Date Taking? Authorizing Provider  HYDROcodone -acetaminophen  (NORCO/VICODIN) 5-325 MG tablet Take 1 tablet by mouth every 6 (six) hours as needed. 10/21/23  Yes Marsena Taff C, PA-C  methocarbamol  (ROBAXIN ) 500 MG tablet Take 1 tablet (500 mg total) by mouth 2 (two) times daily. 10/21/23  Yes Gertha Lichtenberg, Aleck BROCKS, PA-C  betamethasone  dipropionate 0.05 % cream Apply topically 2 (two) times daily as needed (Rash). 07/11/21   Sheffield, Kelli R, PA-C  Cetirizine  HCl (ZYRTEC  ALLERGY PO) Take by mouth.    [provider]  cyclobenzaprine  (FLEXERIL ) 10 MG tablet Take 1 tablet (10 mg total) by mouth 2 (two) times daily as needed. 05/14/23   Newlin, Enobong, MD  dicyclomine  (BENTYL ) 10 MG capsule Take 1 capsule (10 mg total) by mouth 3 (three) times daily before meals. 05/14/23   Newlin, Enobong, MD  estradiol  (VIVELLE -DOT) 0.075 MG/24HR PLACE 1 PATCH ONTO THE SKIN 2 TIMES A WEEK. 10/13/23   Cleotilde Ronal RAMAN,  MD  gabapentin  (NEURONTIN ) 100 MG capsule Take 1- 2 capsule qhs prn hot flashes or insomnia 05/14/23   Newlin, Enobong, MD  hydrochlorothiazide  (HYDRODIURIL ) 25 MG tablet Take 1 tablet (25 mg total) by mouth daily. 05/14/23   Newlin, Enobong, MD  ibuprofen  (ADVIL ) 600 MG tablet TAKE 1 TABLET (600 MG TOTAL) BY MOUTH 2 (TWO) TIMES DAILY AS NEEDED. 05/15/21   Delbert Clam, MD  losartan  (COZAAR ) 50 MG tablet Take 1 tablet (50 mg total) by mouth daily. 05/14/23   Newlin, Enobong, MD  meloxicam  (MOBIC ) 15 MG tablet Take 15 mg by mouth daily as needed. 11/05/22   [provider]  omeprazole  (PRILOSEC) 20 MG capsule Take 1 capsule (20 mg total) by mouth as needed. 07/03/21   Newlin, Enobong, MD  Polyethyl Glycol-Propyl Glycol (SYSTANE) 0.4-0.3 % SOLN Place 1 drop into both eyes daily as needed (Dry eye).    [provider]  progesterone  (PROMETRIUM ) 200 MG capsule Take 1 capsule (200 mg total) by mouth daily. 09/25/23   Cleotilde Ronal RAMAN, MD  rosuvastatin  (CRESTOR ) 20 MG tablet Take 1 tablet (20 mg total) by mouth daily. 03/17/23   Chandrasekhar, Mahesh A, MD  SODIUM FLUORIDE 5000 PPM 1.1 % PSTE See admin instructions. 04/09/21   [provider]  triamcinolone  (NASACORT ) 55 MCG/ACT AERO nasal inhaler Place 2 sprays into the nose daily. Patient taking differently: Place 2 sprays into the nose daily as needed (Allergies). 04/09/19   Maranda Jamee Jacob, MD    Allergies: Apple juice  Review of Systems  Constitutional:  Negative for fever.  Gastrointestinal:  Negative for abdominal pain.  Genitourinary:  Negative for dysuria.  Musculoskeletal:  Positive for back pain and gait problem. Negative for neck pain.    Updated Vital Signs BP 109/72   Pulse (!) 53   Temp 98.2 F (36.8 C) (Oral)   Resp 16   Ht 5' 7 (1.702 m)   Wt 78 kg   SpO2 96%   BMI 26.94 kg/m   Physical Exam Vitals and nursing note reviewed.  Constitutional:      General: She is not in acute distress.     Appearance: She is not ill-appearing.  HENT:     Head: Normocephalic.  Eyes:     Pupils: Pupils are equal, round, and reactive to light.  Cardiovascular:     Rate and Rhythm: Normal rate and regular rhythm.     Pulses: Normal pulses.     Heart sounds: Normal heart sounds. No murmur heard.    No friction rub. No gallop.  Pulmonary:     Effort: Pulmonary effort is normal.     Breath sounds: Normal breath sounds.  Abdominal:     General: Abdomen is flat. There is no distension.     Palpations: Abdomen is soft.     Tenderness: There is no abdominal tenderness. There is no guarding or rebound.  Musculoskeletal:        General: Normal range of motion.     Cervical back: Neck supple.     Comments: No thoracic or lumbar midline tenderness.  Reproducible bilateral lumbar paraspinal tenderness.  Able to lift both lower extremities off bed.  5/5 strength.  Sensation intact bilaterally.  Skin:    General: Skin is warm and dry.  Neurological:     General: No focal deficit present.     Mental Status: She is alert.  Psychiatric:        Mood and Affect: Mood normal.        Behavior: Behavior normal.     (all labs ordered are listed, but only abnormal results are displayed) Labs Reviewed  CBC WITH DIFFERENTIAL/PLATELET - Abnormal; Notable for the following components:      Result Value   RBC 5.25 (*)    All other components within normal limits  BASIC METABOLIC PANEL WITH GFR - Abnormal; Notable for the following components:   Potassium 3.4 (*)    Glucose, Bld 106 (*)    Calcium  10.4 (*)    All other components within normal limits  URINALYSIS, ROUTINE W REFLEX MICROSCOPIC - Abnormal; Notable for the following components:   APPearance HAZY (*)    Hgb urine dipstick TRACE (*)    All other components within normal limits    EKG: None  Radiology: No results found.   Procedures   Medications Ordered in the ED  lidocaine  (LIDODERM ) 5 % 2 patch (2 patches Transdermal Patch Applied  10/21/23 1150)  morphine  (PF) 4 MG/ML injection 4 mg (4 mg Intravenous Given 10/21/23 1032)  methocarbamol  (ROBAXIN ) tablet 500 mg (500 mg Oral Given 10/21/23 1039)  HYDROmorphone  (DILAUDID ) injection 1 mg (1 mg Intravenous Given 10/21/23 1140)  ketorolac  (TORADOL ) 15 MG/ML injection 15 mg (15 mg Intravenous Given 10/21/23 1151)  ondansetron  (ZOFRAN ) injection 4 mg (4 mg Intravenous Given 10/21/23 1259)  sodium chloride  0.9 % bolus 1,000 mL ( Intravenous Stopped 10/21/23 1448)    Clinical Course as of 10/21/23 1548  Tue Oct 21, 2023  1136 Informed by  RN patient still has 10/10 pain. Unable to move. Diluadid given. BP recheck which was 112/79. [CA]  1236 Reassessed patient after Dilaudid .  Patient notes improvement in back pain. [CA]  1334 Reassessed patient. She admits to improvement in pain. Does have some dizziness. BP slightly lower after pain medications. IVFs given.  [CA]  1458 Reassessed patient at bedside.  Patient able to ambulate in the ED.  Low suspicion for cauda equina or central cord compression. [CA]    Clinical Course User Index [CA] Lorelle Aleck BROCKS, PA-C                                 Medical Decision Making Amount and/or Complexity of Data Reviewed Independent Historian: caregiver    Details: Daughter at bedside provided some history External Data Reviewed: notes. Labs: ordered. Decision-making details documented in ED Course.  Risk Prescription drug management.   This patient presents to the ED for concern of low back pain, this involves an extensive number of treatment options, and is a complaint that carries with it a high risk of complications and morbidity.  The differential diagnosis includes MSK etiology, cauda equina, central cord compression, infection, pyelonephritis, AAA, etc  53 year old female presents to the ED due to bilateral low back pain.  History of back pain however, notes this feels more severe.  No injury.  Denies saddle anesthesia, bowel/bladder  incontinence, lower extremity numbness/tingling, lower extremity weakness.  No injury.  No IV drug use or history of cancer.  Upon arrival, stable vitals.  Patient in no acute distress.  Does have reproducible bilateral lumbar paraspinal tenderness.  No thoracic or lumbar midline tenderness.  Lower extremities neurovascularly intact.  IV pain medication given.  Lidoderm  patches.  Given no injury will hold off on x-rays given my suspicion for bony fractures is low.  No infectious symptoms to suggest infection.  Low suspicion for cauda equina or central cord compression.  Abdomen soft, nondistended, nontender.  Low suspicion for AAA.   CBC reassuring.  No leukocytosis.  Normal hemoglobin.  BMP with mild hypokalemia at 3.4.  Normal renal function.  UA negative for signs of infection.  Low suspicion for pyelonephritis.  Patient reassessed numerous times as noted above.  Patient able to ambulate to the bathroom without difficulty.  Discharged patient with symptomatic treatment.  Low suspicion for cauda equina or central cord compression.  No infectious symptoms to suggest infectious etiology.  Advised patient to follow-up with PCP symptoms not improve over the next week.  Low back exercises given. Strict ED precautions discussed with patient. Patient states understanding and agrees to plan. Patient discharged home in no acute distress and stable vitals  Has PCP Hx HTN    Final diagnoses:  Acute bilateral low back pain without sciatica    ED Discharge Orders          Ordered    HYDROcodone -acetaminophen  (NORCO/VICODIN) 5-325 MG tablet  Every 6 hours PRN        10/21/23 1511    methocarbamol  (ROBAXIN ) 500 MG tablet  2 times daily        10/21/23 1511               Kadija Cruzen C, PA-C 10/21/23 1548    Patsey Lot, MD 10/23/23 [phone]

## 2023-10-22 ENCOUNTER — Other Ambulatory Visit (HOSPITAL_BASED_OUTPATIENT_CLINIC_OR_DEPARTMENT_OTHER): Payer: Self-pay | Admitting: Obstetrics & Gynecology

## 2023-10-22 ENCOUNTER — Ambulatory Visit: Payer: Self-pay

## 2023-10-22 ENCOUNTER — Other Ambulatory Visit (HOSPITAL_BASED_OUTPATIENT_CLINIC_OR_DEPARTMENT_OTHER): Payer: Self-pay

## 2023-10-22 NOTE — Telephone Encounter (Signed)
  FYI Only or Action Required?: Action required by provider: request for appointment and update on patient condition.  Patient was last seen in primary care on 05/14/2023 by Newlin, Enobong, MD.  Called Nurse Triage reporting Back Pain.  Symptoms began yesterday.  Interventions attempted: Prescription medications: robaxin , norco and Other: ibuprofen .  Symptoms are: unchanged.  Triage Disposition: See HCP Within 4 Hours (Or PCP Triage)  Patient/caregiver understands and will follow disposition?: Yes  Copied from CRM #[id number]. Topic: Clinical - Red Word Triage >> Oct 22, 2023  4:41 PM Turkey B wrote: [facility] that prompted transfer to Nurse Triage: patient has severe back pain even after ER visit Reason for Disposition  [1] SEVERE back pain (e.g., excruciating, unable to do any normal activities) AND [2] not improved 2 hours after pain medicine  Answer Assessment - Initial Assessment Questions 1. ONSET: When did the pain begin? (e.g., minutes, hours, days)     yesterday 2. LOCATION: Where does it hurt? (upper, mid or lower back)     Low back, right greater than left 3. SEVERITY: How bad is the pain?  (e.g., Scale 1-10; mild, moderate, or severe)     8/10 at rest 4. PATTERN: Is the pain constant? (e.g., yes, no; constant, intermittent)      positional 5. RADIATION: Does the pain shoot into your legs or somewhere else?     denies 6. CAUSE:  What do you think is causing the back pain?      Bent over in shower yesterday and felt a pull 7. BACK OVERUSE:  Any recent lifting of heavy objects, strenuous work or exercise?     denies 8. MEDICINES: What have you taken so far for the pain? (e.g., nothing, acetaminophen , NSAIDS)     Prescribed Norco and robaxin  by ED yesterday, but it gives no relief 9. NEUROLOGIC SYMPTOMS: Do you have any weakness, numbness, or problems with bowel/bladder control?     denies 10. OTHER SYMPTOMS: Do you have any other symptoms? (e.g.,  fever, abdomen pain, burning with urination, blood in urine)       denies 11. PREGNANCY: Is there any chance you are pregnant? When was your last menstrual period?       N/a  Protocols used: Back Pain-A-AH

## 2023-10-23 NOTE — Telephone Encounter (Signed)
 Patient has been scheduled

## 2023-10-24 ENCOUNTER — Other Ambulatory Visit (HOSPITAL_BASED_OUTPATIENT_CLINIC_OR_DEPARTMENT_OTHER): Payer: Self-pay | Admitting: Certified Nurse Midwife

## 2023-10-24 ENCOUNTER — Other Ambulatory Visit (HOSPITAL_BASED_OUTPATIENT_CLINIC_OR_DEPARTMENT_OTHER): Payer: Self-pay

## 2023-10-24 MED ORDER — PROGESTERONE 200 MG PO CAPS
200.0000 mg | ORAL_CAPSULE | Freq: Every day | ORAL | 1 refills | Status: DC
  Filled 2023-10-24: qty 90, 90d supply, fill #0

## 2023-10-24 MED ORDER — PROGESTERONE 200 MG PO CAPS: 200.0000 mg | ORAL_CAPSULE | Freq: Every day | ORAL | 1 refills | Status: AC

## 2023-10-28 ENCOUNTER — Encounter: Payer: Self-pay | Admitting: Family Medicine

## 2023-10-28 ENCOUNTER — Telehealth (HOSPITAL_BASED_OUTPATIENT_CLINIC_OR_DEPARTMENT_OTHER): Admitting: Family Medicine

## 2023-10-28 DIAGNOSIS — M47816 Spondylosis without myelopathy or radiculopathy, lumbar region: Secondary | ICD-10-CM | POA: Diagnosis not present

## 2023-10-28 MED ORDER — LIDOCAINE 5 % EX PTCH: 1.0000 | MEDICATED_PATCH | CUTANEOUS | 1 refills | Status: AC

## 2023-10-28 NOTE — Progress Notes (Signed)
 Virtual Visit via Video Note  I connected with Brandy [name], on 10/28/2023 at 4:52 PM by video enabled telemedicine device and verified that I am speaking with the correct person using two identifiers.   Consent: I discussed the limitations, risks, security and privacy concerns of performing an evaluation and management service by telemedicine and the availability of in person appointments. I also discussed with the patient that there may be a patient responsible charge related to this service. The patient expressed understanding and agreed to proceed.   Location of Patient: Home  Location of Provider: Clinic   Persons participating in Telemedicine visit: Brandy [name] Dr. Delbert    Discussed the use of AI scribe software for clinical note transcription with the patient, who gave verbal consent to proceed.  History of Present Illness Brandy [name] is a 53 year old female with a history of hypertension, hyperlipidemia, anxiety and depression IBS degenerative changes in the lumbar spine who presents with acute exacerbation of back pain.  She experienced a sudden onset of back pain last Tuesday morning while getting dressed, initially feeling a 'pull' on the right side, escalating to severe pain when attempting to put on her jeans. The pain was so intense that she was unable to move her legs or arms without worsening the discomfort.  She visited the emergency room and received medication for pain relief. The pain persists but is less severe, described as shooting across the lower back and exacerbated by movement. She denies numbness or tingling in her legs but notes tingling in the 'blood tops area' bilaterally.  Her current pain level is 6 out of 10. She uses heat for relief but cannot perform back exercises due to pain severity. She was prescribed methocarbamol  during the ER visit and uses hydrocodone  for pain management.  Her medical history includes  degenerative changes in the lumbar spine, specifically arthritis at L5-S1, confirmed by a 2020 x-ray. She has previously consulted a physical medicine and rehabilitation specialist and undergone physical therapy, which was ineffective.      Past Medical History:  Diagnosis Date   Allergy    SEASONAL   Anxiety    Arthritis    Depression    Disc degeneration, lumbar 2011   Heartburn    Hyperlipidemia    Hypertension    IBS (irritable bowel syndrome)    Allergies  Allergen Reactions   Apple Juice Hives    ANYTHING WITH APPLES IN IT    Current Outpatient Medications on File Prior to Visit  Medication Sig Dispense Refill   betamethasone  dipropionate 0.05 % cream Apply topically 2 (two) times daily as needed (Rash). 45 g 11   Cetirizine  HCl (ZYRTEC  ALLERGY PO) Take by mouth.     cyclobenzaprine  (FLEXERIL ) 10 MG tablet Take 1 tablet (10 mg total) by mouth 2 (two) times daily as needed. 60 tablet 2   dicyclomine  (BENTYL ) 10 MG capsule Take 1 capsule (10 mg total) by mouth 3 (three) times daily before meals. 90 capsule 2   estradiol  (VIVELLE -DOT) 0.075 MG/24HR PLACE 1 PATCH ONTO THE SKIN 2 TIMES A WEEK. 24 patch 2   gabapentin  (NEURONTIN ) 100 MG capsule Take 1- 2 capsule qhs prn hot flashes or insomnia 180 capsule 1   hydrochlorothiazide  (HYDRODIURIL ) 25 MG tablet Take 1 tablet (25 mg total) by mouth daily. 90 tablet 1   HYDROcodone -acetaminophen  (NORCO/VICODIN) 5-325 MG tablet Take 1 tablet by mouth every 6 (six) hours as needed. 8 tablet 0  ibuprofen  (ADVIL ) 600 MG tablet TAKE 1 TABLET (600 MG TOTAL) BY MOUTH 2 (TWO) TIMES DAILY AS NEEDED. 60 tablet 3   losartan  (COZAAR ) 50 MG tablet Take 1 tablet (50 mg total) by mouth daily. 90 tablet 1   meloxicam  (MOBIC ) 15 MG tablet Take 15 mg by mouth daily as needed.     methocarbamol  (ROBAXIN ) 500 MG tablet Take 1 tablet (500 mg total) by mouth 2 (two) times daily. 20 tablet 0   omeprazole  (PRILOSEC) 20 MG capsule Take 1 capsule (20 mg total)  by mouth as needed. 90 capsule 0   Polyethyl Glycol-Propyl Glycol (SYSTANE) 0.4-0.3 % SOLN Place 1 drop into both eyes daily as needed (Dry eye).     progesterone  (PROMETRIUM ) 200 MG capsule Take 1 capsule (200 mg total) by mouth daily. 90 capsule 1   rosuvastatin  (CRESTOR ) 20 MG tablet Take 1 tablet (20 mg total) by mouth daily. 90 tablet 3   SODIUM FLUORIDE 5000 PPM 1.1 % PSTE See admin instructions.     triamcinolone  (NASACORT ) 55 MCG/ACT AERO nasal inhaler Place 2 sprays into the nose daily. (Patient taking differently: Place 2 sprays into the nose daily as needed (Allergies).) 1 Inhaler 0   No current facility-administered medications on file prior to visit.    ROS: See HPI  Observations/Objective: Awake, alert, oriented x3 Not in acute distress Normal mood      Latest Ref Rng & Units 10/21/2023   10:36 AM 03/03/2023    9:40 AM 11/14/2022    3:58 PM  CMP  Glucose 70 - 99 mg/dL 893  97  885   BUN 6 - 20 mg/dL 10  16  12    Creatinine 0.44 - 1.00 mg/dL 9.14  9.15  9.18   Sodium 135 - 145 mmol/L 139  141  145   Potassium 3.5 - 5.1 mmol/L 3.4  4.0  3.6   Chloride 98 - 111 mmol/L 100  104  101   CO2 22 - 32 mmol/L 26  29  29    Calcium  8.9 - 10.3 mg/dL 89.5  89.5  9.7   Total Protein 6.0 - 8.5 g/dL   7.1   Total Bilirubin 0.0 - 1.2 mg/dL   0.3   Alkaline Phos 44 - 121 IU/L   83   AST 0 - 40 IU/L   21   ALT 0 - 32 IU/L   26     Lipid Panel     Component Value Date/Time   CHOL 140 04/01/2022 0806   TRIG 148 04/01/2022 0806   HDL 52 04/01/2022 0806   CHOLHDL 2.7 04/01/2022 0806   CHOLHDL 4.2 09/10/2017 0959   VLDL 26 09/10/2017 0959   LDLCALC 63 04/01/2022 0806   LABVLDL 25 04/01/2022 0806    Lab Results  Component Value Date   HGBA1C 6.2 05/14/2023     Assessment and plan:   Assessment & Plan Osteoarthritis of lumbar spine Acute exacerbation of chronic low back pain with lumbar spondylosis. Pain rated 6/10, localized to lower back with tingling in buttocks.  Previous imaging showed L5-S1 degenerative changes. Physical therapy ineffective previously. Current medications include hydrocodone , meloxicam , and muscle relaxants. - Order lumbar spine x-ray at [facility], [address] [place]. - [facility]er to [facility] for [facility]opedic evaluation. - Prescribe Lidoderm  patch for pain management; [facility] [place]. - Advise continuation of heat application to the back. - Encourage minimal back exercises as tolerated, using resources like YouTube for guidance.  Meds ordered this encounter  Medications   lidocaine  (LIDODERM ) 5 %    Sig: Place 1 patch onto the skin daily. Remove & Discard patch within 12 hours or as directed by MD    Dispense:  30 patch    Refill:  1    Follow Up Instructions: Schedule appointment for chronic disease management   I discussed the assessment and treatment plan with the patient. The patient was provided an opportunity to ask questions and all were answered. The patient agreed with the plan and demonstrated an understanding of the instructions.   The patient was advised to call back or seek an in-person evaluation if the symptoms worsen or if the condition fails to improve as anticipated.     I provided 12 minutes total of Telehealth time during this encounter including median intraservice time, reviewing previous notes, investigations, ordering medications, medical decision making, coordinating care and patient verbalized understanding at the end of the visit.     Corrina Sabin, MD, FAAFP. [facility] [place], [place] [phone]   10/28/2023, 4:52 PM

## 2023-10-28 NOTE — Patient Instructions (Signed)
 VISIT SUMMARY:  Today, you were seen for an acute flare-up of your chronic low back pain, which started suddenly last Tuesday. You described the pain as severe, shooting across your lower back, and worsened by movement. You have been managing the pain with medications prescribed during your ER visit, but the pain persists at a level of 6 out of 10. You also mentioned tingling in your buttocks but no numbness or tingling in your legs.  YOUR PLAN:  -CHRONIC LOW BACK PAIN WITH ACUTE EXACERBATION AND LUMBAR SPONDYLOSIS: This means you have a long-term condition affecting your lower back, which has recently worsened. Lumbar spondylosis refers to the wear and tear of the spinal discs in your lower back. We will order a lumbar spine x-ray to get a clearer picture of your condition. You are also being referred to [facility] for a specialized orthopedic evaluation. For pain management, a prescription for Lidoderm  patches [facility] [place]. Continue using heat on your back and try to do minimal back exercises as tolerated, using resources like YouTube for guidance.  INSTRUCTIONS:  Please schedule your lumbar spine x-ray at [facility], located at [address] [facility]. Follow up with [facility] for your orthopedic evaluation. Continue using your current medications and start using the Lidoderm  patches as prescribed.

## 2023-10-29 ENCOUNTER — Ambulatory Visit: Admitting: Family Medicine

## 2023-10-30 ENCOUNTER — Ambulatory Visit
Admission: RE | Admit: 2023-10-30 | Discharge: 2023-10-30 | Disposition: A | Discharge status: Home / Self Care | Source: Ambulatory Visit | Attending: Family Medicine | Admitting: Family Medicine

## 2023-10-30 DIAGNOSIS — M47816 Spondylosis without myelopathy or radiculopathy, lumbar region: Secondary | ICD-10-CM

## 2023-11-06 ENCOUNTER — Ambulatory Visit: Payer: Self-pay | Admitting: Family Medicine

## 2023-11-12 ENCOUNTER — Ambulatory Visit: Attending: Family Medicine | Admitting: Family Medicine

## 2023-11-12 ENCOUNTER — Encounter: Payer: Self-pay | Admitting: Family Medicine

## 2023-11-12 VITALS — BP 112/76 | HR 90 | Ht 67.0 in | Wt 182.2 lb

## 2023-11-12 DIAGNOSIS — N951 Menopausal and female climacteric states: Secondary | ICD-10-CM

## 2023-11-12 DIAGNOSIS — E7800 Pure hypercholesterolemia, unspecified: Secondary | ICD-10-CM

## 2023-11-12 DIAGNOSIS — R600 Localized edema: Secondary | ICD-10-CM | POA: Diagnosis not present

## 2023-11-12 DIAGNOSIS — K580 Irritable bowel syndrome with diarrhea: Secondary | ICD-10-CM

## 2023-11-12 DIAGNOSIS — M47896 Other spondylosis, lumbar region: Secondary | ICD-10-CM

## 2023-11-12 DIAGNOSIS — M5431 Sciatica, right side: Secondary | ICD-10-CM

## 2023-11-12 DIAGNOSIS — R7303 Prediabetes: Secondary | ICD-10-CM | POA: Diagnosis not present

## 2023-11-12 DIAGNOSIS — I10 Essential (primary) hypertension: Secondary | ICD-10-CM

## 2023-11-12 DIAGNOSIS — R140 Abdominal distension (gaseous): Secondary | ICD-10-CM

## 2023-11-12 DIAGNOSIS — E876 Hypokalemia: Secondary | ICD-10-CM

## 2023-11-12 LAB — POCT GLYCOSYLATED HEMOGLOBIN (HGB A1C): HbA1c, POC (prediabetic range): 6 % (ref 5.7–6.4)

## 2023-11-12 MED ORDER — DICYCLOMINE HCL 10 MG PO CAPS: 10.0000 mg | ORAL_CAPSULE | Freq: Three times a day (TID) | ORAL | 2 refills | Status: AC | PRN

## 2023-11-12 MED ORDER — IBUPROFEN 600 MG PO TABS: 600.0000 mg | ORAL_TABLET | Freq: Two times a day (BID) | ORAL | 3 refills | Status: AC | PRN

## 2023-11-12 MED ORDER — POTASSIUM CHLORIDE ER 10 MEQ PO TBCR: 10.0000 meq | EXTENDED_RELEASE_TABLET | Freq: Every day | ORAL | 1 refills | Status: AC

## 2023-11-12 MED ORDER — FUROSEMIDE 20 MG PO TABS: 20.0000 mg | ORAL_TABLET | Freq: Every day | ORAL | 1 refills | Status: DC | PRN

## 2023-11-12 MED ORDER — CYCLOBENZAPRINE HCL 10 MG PO TABS: 10.0000 mg | ORAL_TABLET | Freq: Two times a day (BID) | ORAL | 2 refills | Status: AC | PRN

## 2023-11-12 MED ORDER — GABAPENTIN 100 MG PO CAPS: ORAL_CAPSULE | ORAL | 1 refills | Status: AC

## 2023-11-12 MED ORDER — HYDROCHLOROTHIAZIDE 25 MG PO TABS: 25.0000 mg | ORAL_TABLET | Freq: Every day | ORAL | 1 refills | Status: AC

## 2023-11-12 MED ORDER — LOSARTAN POTASSIUM 50 MG PO TABS: 50.0000 mg | ORAL_TABLET | Freq: Every day | ORAL | 1 refills | Status: AC

## 2023-11-12 NOTE — Progress Notes (Signed)
 Subjective:  Patient ID: Brandy [name], female    DOB: 04/11/1970  Age: 53 y.o. MRN: [id number]  CC: Medical Management of Chronic Issues (Abdominal bloating/Ankles swelling)     Discussed the use of AI scribe software for clinical note transcription with the patient, who gave verbal consent to proceed.  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  She experiences persistent abdominal bloating despite hormone replacement therapy, with increased gassiness but no significant burping unless consuming carbonated drinks. A previous H. pylori breath test in 04/2023 was negative. She has a history of IBS with diarrhea and uses dicyclomine  as needed to manage symptoms.  She notes ankle swelling, particularly with prolonged walking or standing. She is on losartan  50 mg and hydrochlorothiazide  for hypertension. Recent labs showed slightly low potassium levels at 3.4.  She has no dyspnea or orthopnea and has a good exercise tolerance.  Cardiac workup in 2022 when she did have dyspnea revealed normal EF of 60 to 65%, right heart cath was normal.  Her current medications include gabapentin  nightly, with prescriptions for Flexeril  and ibuprofen . She does not take omeprazole , although it is listed in her medication list. She is also on Crestor , prescribed by her cardiologist for hyperlipidemia.    Past Medical History:  Diagnosis Date   Allergy    SEASONAL   Anxiety    Arthritis    Depression    Disc degeneration, lumbar 2011   Heartburn    Hyperlipidemia    Hypertension    IBS (irritable bowel syndrome)     Past Surgical History:  Procedure Laterality Date   RIGHT HEART CATH N/A 06/12/2020   Procedure: RIGHT HEART CATH;  Surgeon: Cherrie Toribio SAUNDERS, MD;  Location: [facility];  Service: Cardiovascular;  Laterality: N/A;   TUBAL LIGATION      Family History  Problem Relation Age of Onset   Breast cancer Mother 37       ER+   Thyroid disease Mother    Hyperlipidemia Mother    Diabetes Father    Hypertension Father        and aunt and uncles   Thyroid disease Father    Hyperlipidemia Father    Breast cancer Maternal Aunt 71   Hypertension Paternal Grandfather    Prostate cancer Paternal Grandfather    Stomach cancer Cousin    Colon cancer Neg Hx    Colon polyps Neg Hx    Esophageal cancer Neg Hx    Rectal cancer Neg Hx     Social History   Socioeconomic History   Marital status: Divorced    Spouse name: Not on file   Number of children: 3   Years of education: Not on file   Highest education level: 12th grade  Occupational History   Occupation: Company secretary  Tobacco Use   Smoking status: Never    Passive exposure: Never   Smokeless tobacco: Never  Vaping Use   Vaping status: Never Used  Substance and Sexual Activity   Alcohol use: Yes    Comment: social   Drug use: No   Sexual activity: Yes    Partners: Male    Birth control/protection: Surgical, None    Comment: BTL  Other Topics Concern   Not on file  Social History Narrative   Not  on file   Social Drivers of Health   Financial Resource Strain: Not on file  Food Insecurity: No Food Insecurity (03/21/2020)   Hunger Vital Sign    Worried About Running Out of Food in the Last Year: Never true    Ran Out of Food in the Last Year: Never true  Transportation Needs: No Transportation Needs (03/21/2020)   PRAPARE - Administrator, Civil Service (Medical): No    Lack of Transportation (Non-Medical): No  Physical Activity: Not on file  Stress: Not on file  Social Connections: Not on file    Allergies  Allergen Reactions   Apple Juice Hives    ANYTHING WITH APPLES IN IT    Outpatient Medications Prior to Visit  Medication Sig Dispense Refill   betamethasone   dipropionate 0.05 % cream Apply topically 2 (two) times daily as needed (Rash). 45 g 11   Cetirizine  HCl (ZYRTEC  ALLERGY PO) Take by mouth.     estradiol  (VIVELLE -DOT) 0.075 MG/24HR PLACE 1 PATCH ONTO THE SKIN 2 TIMES A WEEK. 24 patch 2   lidocaine  (LIDODERM ) 5 % Place 1 patch onto the skin daily. Remove & Discard patch within 12 hours or as directed by MD 30 patch 1   methocarbamol  (ROBAXIN ) 500 MG tablet Take 1 tablet (500 mg total) by mouth 2 (two) times daily. 20 tablet 0   Polyethyl Glycol-Propyl Glycol (SYSTANE) 0.4-0.3 % SOLN Place 1 drop into both eyes daily as needed (Dry eye).     progesterone  (PROMETRIUM ) 200 MG capsule Take 1 capsule (200 mg total) by mouth daily. 90 capsule 1   rosuvastatin  (CRESTOR ) 20 MG tablet Take 1 tablet (20 mg total) by mouth daily. 90 tablet 3   SODIUM FLUORIDE 5000 PPM 1.1 % PSTE See admin instructions.     triamcinolone  (NASACORT ) 55 MCG/ACT AERO nasal inhaler Place 2 sprays into the nose daily. (Patient taking differently: Place 2 sprays into the nose daily as needed (Allergies).) 1 Inhaler 0   cyclobenzaprine  (FLEXERIL ) 10 MG tablet Take 1 tablet (10 mg total) by mouth 2 (two) times daily as needed. 60 tablet 2   dicyclomine  (BENTYL ) 10 MG capsule Take 1 capsule (10 mg total) by mouth 3 (three) times daily before meals. 90 capsule 2   gabapentin  (NEURONTIN ) 100 MG capsule Take 1- 2 capsule qhs prn hot flashes or insomnia 180 capsule 1   hydrochlorothiazide  (HYDRODIURIL ) 25 MG tablet Take 1 tablet (25 mg total) by mouth daily. 90 tablet 1   ibuprofen  (ADVIL ) 600 MG tablet TAKE 1 TABLET (600 MG TOTAL) BY MOUTH 2 (TWO) TIMES DAILY AS NEEDED. 60 tablet 3   losartan  (COZAAR ) 50 MG tablet Take 1 tablet (50 mg total) by mouth daily. 90 tablet 1   meloxicam  (MOBIC ) 15 MG tablet Take 15 mg by mouth daily as needed.     omeprazole  (PRILOSEC) 20 MG capsule Take 1 capsule (20 mg total) by mouth as needed. 90 capsule 0   HYDROcodone -acetaminophen  (NORCO/VICODIN) 5-325  MG tablet Take 1 tablet by mouth every 6 (six) hours as needed. (Patient not taking: Reported on 11/12/2023) 8 tablet 0   No facility-administered medications prior to visit.     ROS Review of Systems  Constitutional:  Negative for activity change and appetite change.  HENT:  Negative for sinus pressure and sore throat.   Respiratory:  Negative for chest tightness, shortness of breath and wheezing.   Cardiovascular:  Positive for leg swelling. Negative for chest pain and palpitations.  Gastrointestinal:  Negative for abdominal distention, abdominal pain and constipation.  Genitourinary: Negative.   Musculoskeletal: Negative.   Psychiatric/Behavioral:  Negative for behavioral problems and dysphoric mood.     Objective:  BP 112/76   Pulse 90   Ht 5' 7 (1.702 m)   Wt 182 lb 3.2 oz (82.6 kg)   SpO2 98%   BMI 28.54 kg/m      11/12/2023    3:31 PM 10/21/2023    3:30 PM 10/21/2023    2:30 PM  BP/Weight  Systolic BP 112 110 109  Diastolic BP 76 78 77  Wt. (Lbs) 182.2    BMI 28.54 kg/m2        Physical Exam Constitutional:      Appearance: She is well-developed.  Cardiovascular:     Rate and Rhythm: Normal rate.     Heart sounds: Normal heart sounds. No murmur heard. Pulmonary:     Effort: Pulmonary effort is normal.     Breath sounds: Normal breath sounds. No wheezing or rales.  Chest:     Chest wall: No tenderness.  Abdominal:     General: Bowel sounds are normal. There is no distension.     Palpations: Abdomen is soft. There is no mass.     Tenderness: There is no abdominal tenderness.  Musculoskeletal:        General: Normal range of motion.     Right lower leg: Edema (1+ ankle) present.     Left lower leg: Edema (1+ ankle) present.  Neurological:     Mental Status: She is alert and oriented to person, place, and time.  Psychiatric:        Mood and Affect: Mood normal.        Latest Ref Rng & Units 10/21/2023   10:36 AM 03/03/2023    9:40 AM 11/14/2022    3:58  PM  CMP  Glucose 70 - 99 mg/dL 893  97  885   BUN 6 - 20 mg/dL 10  16  12    Creatinine 0.44 - 1.00 mg/dL 9.14  9.15  9.18   Sodium 135 - 145 mmol/L 139  141  145   Potassium 3.5 - 5.1 mmol/L 3.4  4.0  3.6   Chloride 98 - 111 mmol/L 100  104  101   CO2 22 - 32 mmol/L 26  29  29    Calcium  8.9 - 10.3 mg/dL 89.5  89.5  9.7   Total Protein 6.0 - 8.5 g/dL   7.1   Total Bilirubin 0.0 - 1.2 mg/dL   0.3   Alkaline Phos 44 - 121 IU/L   83   AST 0 - 40 IU/L   21   ALT 0 - 32 IU/L   26     Lipid Panel     Component Value Date/Time   CHOL 140 04/01/2022 0806   TRIG 148 04/01/2022 0806   HDL 52 04/01/2022 0806   CHOLHDL 2.7 04/01/2022 0806   CHOLHDL 4.2 09/10/2017 0959   VLDL 26 09/10/2017 0959   LDLCALC 63 04/01/2022 0806    CBC    Component Value Date/Time   WBC 7.4 10/21/2023 1036   RBC 5.25 (H) 10/21/2023 1036   HGB 15.0 10/21/2023 1036   HGB 14.7 09/03/2023 0932   HCT 45.4 10/21/2023 1036   HCT 46.2 09/03/2023 0932   PLT 323 10/21/2023 1036   PLT 275 09/03/2023 0932   MCV 86.5 10/21/2023 1036   MCV 89 09/03/2023 0932  MCH 28.6 10/21/2023 1036   MCHC 33.0 10/21/2023 1036   RDW 14.9 10/21/2023 1036   RDW 14.3 09/03/2023 0932   LYMPHSABS 1.6 10/21/2023 1036   LYMPHSABS 1.9 01/03/2021 1625   MONOABS 0.5 10/21/2023 1036   EOSABS 0.1 10/21/2023 1036   EOSABS 0.0 01/03/2021 1625   BASOSABS 0.0 10/21/2023 1036   BASOSABS 0.0 01/03/2021 1625    Lab Results  Component Value Date   HGBA1C 6.0 11/12/2023       Assessment & Plan Abdominal bloating Chronic bloating likely due to menopause and hormone therapy. Negative H. pylori test in March 2025. Symptoms may be exacerbated by hormone therapy. - Repeat H. pylori test to confirm negative status. - Continue Gas-X as needed.  Irritable bowel syndrome with diarrhea IBS with diarrhea managed with dicyclomine . Uses dicyclomine  as needed to avoid constipation. - Continue dicyclomine  10 mg as needed before meals. - Refill  dicyclomine  prescription.  Pedal edema Chronic pedal edema likely due to dependent positioning. Current medications do not cause swelling. Potassium level slightly low at 3.4 mEq/L, possibly due to diuretics. Furosemide  as needed requires potassium supplementation. - Prescribe furosemide  as needed for swelling. - Prescribe potassium supplements with furosemide . - Advise low sodium intake and use of compression stockings as needed. - Check potassium level today.  Hypertension Hypertension managed with losartan  and hydrochlorothiazide . - Continue losartan  50 mg daily. - Continue hydrochlorothiazide  25 mg daily.  Hypokalemia Repeat potassium level was last potassium was 3.4  Sciatica Osteoarthritis of lumbar spine - Stable - Continue ibuprofen  as needed - Upcoming appointment with orthopedic  Vasomotor menopausal symptoms - Currently on estradiol  patch   Hyperlipidemia - Continue Crestor  Will check lipid panel at next visit.  General Health Maintenance Cholesterol last checked February 2024. No current cardiologist appointment. Advised fasting cholesterol check at next visit. - Order future labs for fasting cholesterol check at next visit.     Meds ordered this encounter  Medications   furosemide  (LASIX ) 20 MG tablet    Sig: Take 1 tablet (20 mg total) by mouth daily as needed.    Dispense:  30 tablet    Refill:  1   cyclobenzaprine  (FLEXERIL ) 10 MG tablet    Sig: Take 1 tablet (10 mg total) by mouth 2 (two) times daily as needed.    Dispense:  60 tablet    Refill:  2   dicyclomine  (BENTYL ) 10 MG capsule    Sig: Take 1 capsule (10 mg total) by mouth 3 (three) times daily as needed for spasms.    Dispense:  90 capsule    Refill:  2   gabapentin  (NEURONTIN ) 100 MG capsule    Sig: Take 1- 2 capsule qhs prn hot flashes or insomnia    Dispense:  180 capsule    Refill:  1   hydrochlorothiazide  (HYDRODIURIL ) 25 MG tablet    Sig: Take 1 tablet (25 mg total) by mouth  daily.    Dispense:  90 tablet    Refill:  1   ibuprofen  (ADVIL ) 600 MG tablet    Sig: Take 1 tablet (600 mg total) by mouth 2 (two) times daily as needed.    Dispense:  60 tablet    Refill:  3   losartan  (COZAAR ) 50 MG tablet    Sig: Take 1 tablet (50 mg total) by mouth daily.    Dispense:  90 tablet    Refill:  1   potassium chloride  (KLOR-CON  10) 10 MEQ tablet    Sig: Take  1 tablet (10 mEq total) by mouth daily.    Dispense:  30 tablet    Refill:  1    Follow-up: Return in about 6 months (around 05/11/2024) for Chronic medical conditions.       Corrina Sabin, MD, FAAFP. [facility] [place], [place] [phone]   11/12/2023, 5:53 PM

## 2023-11-12 NOTE — Patient Instructions (Signed)
 VISIT SUMMARY:  Today, we discussed your ongoing issues with abdominal bloating, ankle swelling, and your management of irritable bowel syndrome (IBS) and hypertension. We reviewed your current medications and made some adjustments to better manage your symptoms.  YOUR PLAN:  -ABDOMINAL BLOATING ASSOCIATED WITH MENOPAUSE AND HORMONE THERAPY: Your abdominal bloating is likely related to menopause and hormone therapy. We will repeat the H. pylori test to confirm the negative result and you should continue using Gas-X as needed.  -IRRITABLE BOWEL SYNDROME WITH DIARRHEA: IBS with diarrhea is a condition that affects your digestive system, causing symptoms like diarrhea and abdominal pain. Continue taking dicyclomine  10 mg as needed before meals to manage your symptoms. We have also refilled your prescription for dicyclomine .  -PEDAL EDEMA: Pedal edema is swelling in your ankles, likely due to prolonged standing or walking. We have prescribed furosemide  to use as needed for the swelling and potassium supplements to take with it. Additionally, you should reduce your sodium intake and use compression stockings if necessary. We will check your potassium level today.  -HYPERTENSION: Hypertension, or high blood pressure, is being managed with your current medications. Continue taking losartan  50 mg daily and hydrochlorothiazide  25 mg daily.  -GENERAL HEALTH MAINTENANCE: Your cholesterol was last checked in February 2024. We will order labs for a fasting cholesterol check at your next visit.  INSTRUCTIONS:  Please follow up with the lab today to check your potassium level. At your next visit, we will also check your fasting cholesterol levels. Continue with your current medications and the new prescriptions provided. If you experience any new or worsening symptoms, please contact our office.

## 2023-11-13 LAB — H. PYLORI BREATH COLLECTION

## 2023-11-13 LAB — H. PYLORI BREATH TEST: H pylori Breath Test: NEGATIVE

## 2023-11-13 LAB — POTASSIUM: Potassium: 3.4 mmol/L — ABNORMAL LOW (ref 3.5–5.2)

## 2023-11-14 ENCOUNTER — Ambulatory Visit: Payer: Self-pay | Admitting: Family Medicine

## 2023-11-24 ENCOUNTER — Other Ambulatory Visit (HOSPITAL_BASED_OUTPATIENT_CLINIC_OR_DEPARTMENT_OTHER): Payer: Self-pay

## 2023-11-24 ENCOUNTER — Ambulatory Visit: Admitting: Physical Medicine and Rehabilitation

## 2023-11-24 ENCOUNTER — Encounter: Payer: Self-pay | Admitting: Physical Medicine and Rehabilitation

## 2023-11-24 DIAGNOSIS — M5442 Lumbago with sciatica, left side: Secondary | ICD-10-CM

## 2023-11-24 DIAGNOSIS — M5416 Radiculopathy, lumbar region: Secondary | ICD-10-CM

## 2023-11-24 DIAGNOSIS — G8929 Other chronic pain: Secondary | ICD-10-CM

## 2023-11-24 DIAGNOSIS — M5441 Lumbago with sciatica, right side: Secondary | ICD-10-CM

## 2023-11-24 MED ORDER — DIAZEPAM 5 MG PO TABS
ORAL_TABLET | ORAL | 0 refills | Status: DC
  Filled 2023-11-24: qty 1, 1d supply, fill #0

## 2023-11-24 NOTE — Progress Notes (Signed)
 Brandy [name] - 53 y.o. female MRN [id number]  Date of birth: 06/18/1970  Office Visit Note: Visit Date: 11/24/2023 PCP: Delbert Clam, MD Referred by: Delbert Clam, MD  Subjective: Chief Complaint  Patient presents with   Lower Back - Pain   HPI: Brandy [name] is a 53 y.o. female who comes in today per the request of Dr. Enobong Newlin for evaluation chronic, worsening and severe bilateral lower back pain radiating to buttocks. She reports years of lower back pain, her pain became worse about 1 month ago. Her pain worsens with movement and activity. She describes pain as pressing and throbbing sensation, currently rates as 6 out of 10. Some relief of pain with physician directed home exercise regimen, rest and use of medications. Some relief of pain with Flexeril , Ibuprofen  and Lidocaine  patches. History of formal physical therapy with minimal relief of pain. Recent lumbar radiographs shows broad-based dextroscoliotic curvature of the thoracolumbar spine. mild degenerative disc changes at L5-S1. There is facet arthropathy at L4-L5. No history of lumbar surgery/injections. Patient denies focal weakness, numbness and tingling.   Patients course is complicated by depression and anxiety.      Review of Systems  Musculoskeletal:  Positive for back pain and myalgias.  Neurological:  Negative for tingling, sensory change, focal weakness and weakness.  All other systems reviewed and are negative.  Otherwise per HPI.  Assessment & Plan: Visit Diagnoses:    ICD-10-CM   1. Chronic bilateral low back pain with bilateral sciatica  M54.42 MR LUMBAR SPINE WO CONTRAST   M54.41    G89.29     2. Lumbar radiculopathy  M54.16 MR LUMBAR SPINE WO CONTRAST       Plan: Findings:  Chronic, worsening and severe bilateral lower back pain radiating to buttocks. Patient continues to have severe pain despite good conservative therapies such as formal physical therapy, home exercise  regimen, rest and use of medications. Patients clinical presentation and exam are complex, differentials include lumbar radiculopathy vs facet mediated pain. We discussed treatment plan in detail today. Next step is to place order for lumbar MRI imaging. Depending on results of MRI imaging we discussed possibility of performing lumbar injections. She can continue with Flexeril  as needed. Could also look at re-grouping with formal physical therapy. Her exam is non focal, good strength noted to bilateral lower extremities. No red flag symptoms noted upon exam today.     Meds & Orders: No orders of the defined types were placed in this encounter.   Orders Placed This Encounter  Procedures   MR LUMBAR SPINE WO CONTRAST    Follow-up: Return for Lumbar MRI review.   Procedures: No procedures performed      Clinical History: Narrative & Impression CLINICAL DATA:  Degenerative disc disease of lumbar spine.   EXAM: LUMBAR SPINE - COMPLETE 4+ VIEW   COMPARISON:  11/27/2018   FINDINGS: Five non-rib-bearing lumbar vertebra. Broad-based dextroscoliotic curvature of the thoracolumbar spine, without significant change. Trace anterolisthesis of L5 on S1 is unchanged from prior exam. Vertebral body heights are normal. No fracture or compression deformity. Mild L5-S1 disc space narrowing and spurring. Remaining disc spaces are preserved. Suggestion of L4-L5 facet hypertrophy. No visible pars defects. The sacroiliac joints are normal.   IMPRESSION: 1. Mild L5-S1 degenerative disc disease. 2. Suggestion of L4-L5 facet hypertrophy. 3. Broad-based dextroscoliotic curvature of the thoracolumbar spine.     Electronically Signed   By: Andrea Gasman M.D.   On: 11/05/2023 23:59  She reports that she has never smoked. She has never been exposed to tobacco smoke. She has never used smokeless tobacco.  Recent Labs    05/14/23 1638 11/12/23 1536  HGBA1C 6.2 6.0    Objective:  VS:  HT:    WT:    BMI:     BP:   HR: bpm  TEMP: ( )  RESP:  Physical Exam Vitals and nursing note reviewed.  HENT:     Head: Normocephalic and atraumatic.     Right Ear: External ear normal.     Left Ear: External ear normal.     Nose: Nose normal.     Mouth/Throat:     Mouth: Mucous membranes are moist.  Eyes:     Extraocular Movements: Extraocular movements intact.  Cardiovascular:     Rate and Rhythm: Normal rate.     Pulses: Normal pulses.  Pulmonary:     Effort: Pulmonary effort is normal.  Abdominal:     General: Abdomen is flat. There is no distension.  Musculoskeletal:        General: Tenderness present.     Cervical back: Normal range of motion.     Comments: Patient rises from seated position to standing without difficulty. Good lumbar range of motion. No pain noted with facet loading. 5/5 strength noted with bilateral hip flexion, knee flexion/extension, ankle dorsiflexion/plantarflexion and EHL. No clonus noted bilaterally. No pain upon palpation of greater trochanters. No pain with internal/external rotation of bilateral hips. Sensation intact bilaterally. Myofascial tenderness noted to bilateral quadratus lumborum and bilateral gluteal regions upon palpation. Negative slump test bilaterally. Ambulates without aid, gait steady.     Skin:    General: Skin is warm and dry.     Capillary Refill: Capillary refill takes less than 2 seconds.  Neurological:     General: No focal deficit present.     Mental Status: She is alert and oriented to person, place, and time.  Psychiatric:        Mood and Affect: Mood normal.        Behavior: Behavior normal.     Ortho Exam  Imaging: No results found.  Past Medical/Family/Surgical/Social History: Medications & Allergies reviewed per EMR, new medications updated. Patient Active Problem List   Diagnosis Date Noted   Dizzy 03/03/2023   Decreased libido 11/10/2021   Prediabetes 01/03/2021   Familial hypercholesterolemia 02/22/2020    Essential hypertension 04/09/2019   HLD (hyperlipidemia) 04/09/2019   Irritable bowel syndrome (IBS) 04/09/2019   Carpal tunnel syndrome 10/16/2017   Anxiety and depression 10/16/2017   Past Medical History:  Diagnosis Date   Allergy    SEASONAL   Anxiety    Arthritis    Depression    Disc degeneration, lumbar 2011   Heartburn    Hyperlipidemia    Hypertension    IBS (irritable bowel syndrome)    Family History  Problem Relation Age of Onset   Breast cancer Mother 42       ER+   Thyroid disease Mother    Hyperlipidemia Mother    Diabetes Father    Hypertension Father        and aunt and uncles   Thyroid disease Father    Hyperlipidemia Father    Breast cancer Maternal Aunt 41   Hypertension Paternal Grandfather    Prostate cancer Paternal Grandfather    Stomach cancer Cousin    Colon cancer Neg Hx    Colon polyps Neg Hx    Esophageal  cancer Neg Hx    Rectal cancer Neg Hx    Past Surgical History:  Procedure Laterality Date   RIGHT HEART CATH N/A 06/12/2020   Procedure: RIGHT HEART CATH;  Surgeon: Cherrie Toribio SAUNDERS, MD;  Location: [facility];  Service: Cardiovascular;  Laterality: N/A;   TUBAL LIGATION     Social History   Occupational History   Occupation: Company secretary  Tobacco Use   Smoking status: Never    Passive exposure: Never   Smokeless tobacco: Never  Vaping Use   Vaping status: Never Used  Substance and Sexual Activity   Alcohol use: Yes    Comment: social   Drug use: No   Sexual activity: Yes    Partners: Male    Birth control/protection: Surgical, None    Comment: BTL

## 2023-11-24 NOTE — Progress Notes (Signed)
 Pain Scale   Average Pain 5 Patient advising she has lower back pain radiating to right side pain is chronic and constant.        +Driver, -BT, -Dye Allergies.

## 2023-11-28 ENCOUNTER — Ambulatory Visit
Admission: RE | Admit: 2023-11-28 | Discharge: 2023-11-28 | Disposition: A | Discharge status: Home / Self Care | Source: Ambulatory Visit | Attending: Physical Medicine and Rehabilitation | Admitting: Physical Medicine and Rehabilitation

## 2023-11-28 DIAGNOSIS — G8929 Other chronic pain: Secondary | ICD-10-CM

## 2023-11-28 DIAGNOSIS — M5416 Radiculopathy, lumbar region: Secondary | ICD-10-CM

## 2023-12-11 ENCOUNTER — Ambulatory Visit: Admitting: Physical Medicine and Rehabilitation

## 2023-12-11 ENCOUNTER — Encounter: Payer: Self-pay | Admitting: Physical Medicine and Rehabilitation

## 2023-12-11 DIAGNOSIS — G8929 Other chronic pain: Secondary | ICD-10-CM

## 2023-12-11 DIAGNOSIS — M545 Low back pain, unspecified: Secondary | ICD-10-CM

## 2023-12-11 DIAGNOSIS — M47816 Spondylosis without myelopathy or radiculopathy, lumbar region: Secondary | ICD-10-CM

## 2023-12-11 DIAGNOSIS — M47819 Spondylosis without myelopathy or radiculopathy, site unspecified: Secondary | ICD-10-CM | POA: Diagnosis not present

## 2023-12-11 NOTE — Progress Notes (Signed)
 Pain Scale   Average Pain 6 Patient advising she has chronic lower back pain that increases when standing and walking and decreasing when sitting and resting.        +Driver, -BT, -Dye Allergies.

## 2023-12-11 NOTE — Progress Notes (Addendum)
 "  Brandy [name] - 53 y.o. female MRN [id number]  Date of birth: April 09, 1970  Office Visit Note: Visit Date: 12/11/2023 PCP: Delbert Clam, MD Referred by: Delbert Clam, MD  Subjective: Chief Complaint  Patient presents with   Lower Back - Pain   HPI: Brandy [name] is a 53 y.o. female who comes in today for evaluation of chronic, worsening and severe bilateral lower back pain radiating to buttocks. Pain ongoing for several years. She is here today for lumbar MRI review. Her pain worsens with movement and activity. She describes her pain as sore and throbbing sensation, currently rates as 6 out of 10.  Some relief of pain with physician directed home exercise regimen, rest and use of medications. Some relief of pain with Flexeril , Ibuprofen  and Lidocaine  patches. History of formal physical therapy with minimal relief of pain. Recent lumbar MRI imaging shows degenerative disc desiccation and multilevel facet arthrosis most notably from L3 through S1. No significant foraminal stenosis/high grade spinal canal stenosis. Patient denies focal weakness, numbness and tingling. No recent trauma or falls.       Review of Systems  Musculoskeletal:  Positive for back pain.  Neurological:  Negative for tingling, sensory change, focal weakness and weakness.  All other systems reviewed and are negative.  Otherwise per HPI.  Assessment & Plan: Visit Diagnoses:    ICD-10-CM   1. Chronic bilateral low back pain without sciatica  M54.50 Ambulatory referral to Physical Medicine Rehab   G89.29     2. Spondylosis without myelopathy or radiculopathy  M47.819 Ambulatory referral to Physical Medicine Rehab    3. Facet arthropathy, lumbar  M47.816 Ambulatory referral to Physical Medicine Rehab       Plan: Findings:  Chronic, worsening and severe bilateral lower back pain radiating to buttocks.  Patient continues to have severe pain despite good conservative therapy such as formal  physical therapy, home exercise regimen, rest and use of medications.  I discussed recent lumbar MRI with her today using imaging and spine model.  There is multilevel facet arthrosis noted.  No significant nerve impingement.  No high-grade spinal canal stenosis.  Patient's clinical presentation and exam are consistent with facet mediated pain.  She does have pain with lumbar extension today.  I do think the intermittent pain radiating to her buttocks is more of a facet joint syndrome.  We discussed treatment plan in detail today.  Next step is to perform diagnostic bilateral L4-5 and L5-S1 medial branch blocks under fluoroscopic guidance.  If good relief of pain with diagnostic blocks we discussed the possibility of longer sustained pain relief with radiofrequency ablation.  I discussed double block paradigm and ablation procedure in detail, she has no questions at this time.  No red flag symptoms noted upon exam today.    Meds & Orders: No orders of the defined types were placed in this encounter.   Orders Placed This Encounter  Procedures   Ambulatory referral to Physical Medicine Rehab    Follow-up: Return for Bilateral L4-L5 and L5-S1 medial branch blocks.   Procedures: No procedures performed      Clinical History: MR LUMBAR SPINE WITHOUT IV CONTRAST   COMPARISON: X-ray 10/30/2023   CLINICAL HISTORY: Low back pain with radiculopathy.   TECHNIQUE: SAG T2, SAG T1, SAG STIR, AX T2, AX T1 without IV contrast.   FINDINGS: There is mild rightward scoliotic curvature of the thoracolumbar spine. Slight compensatory levoscoliosis of the lumbar spine. Mild disc desiccation facet arthrosis most notably  at L4-5 and L5-S1. There is no vertebral body height loss, subluxation or marrow replacing process. The sacrum and SI joints are unremarkable so far as visualized. 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 Conus and cauda equina are unremarkable.   T12-L1: There is no focal  disc protrusion, foraminal or spinal stenosis.   L1-2: There is no focal disc protrusion, foraminal or spinal stenosis.   L2-3: There is no focal disc protrusion, foraminal or spinal stenosis.   L3-4: Mild broad-based bulge and mild-to-moderate facet arthrosis. There is caudal foraminal narrowing, left greater than right. There is slight effacement of thecal sac. No significant foraminal or spinal stenosis.   L4-5: Mild broad-based disc osteophyte moderate facet arthrosis. There is caudal foraminal narrowing bilaterally without impingement of the exiting nerves. There is slight crowding of the descending nerve roots in lateral recess without impingement.   L5-S1: Mild disc desiccation and mild facet arthrosis. No significant foraminal or spinal stenosis.   The retroperitoneal structures demonstrate no significant abnormality.   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 There is no high-grade foraminal or spinal stenosis. See above for more detail at each individual level.   No acute abnormality.   Electronically signed by: Norleen Satchel MD 11/29/2023 02:12 PM EDT RP Workstation: [id number]   She reports that she has never smoked. She has never been exposed to tobacco smoke. She has never used smokeless tobacco.  Recent Labs    05/14/23 1638 11/12/23 1536  HGBA1C 6.2 6.0    Objective:  VS:  HT:    WT:   BMI:     BP:   HR: bpm  TEMP: ( )  RESP:  Physical Exam Vitals and nursing note reviewed.  HENT:     Head: Normocephalic and atraumatic.     Right Ear: External ear normal.     Left Ear: External ear normal.     Nose: Nose normal.     Mouth/Throat:     Mouth: Mucous membranes are moist.  Eyes:     Extraocular Movements: Extraocular movements intact.  Cardiovascular:     Rate and Rhythm: Normal rate.     Pulses: Normal pulses.  Pulmonary:     Effort: Pulmonary effort is normal.  Abdominal:      General: Abdomen is flat. There is no distension.  Musculoskeletal:        General: Tenderness present.     Cervical back: Normal range of motion.     Comments: Patient rises from seated position to standing without difficulty. Pain noted with lumbar extension and facet loading. 5/5 strength noted with bilateral hip flexion, knee flexion/extension, ankle dorsiflexion/plantarflexion and EHL. No clonus noted bilaterally. No pain upon palpation of greater trochanters. No pain with internal/external rotation of bilateral hips. Sensation intact bilaterally. Negative slump test bilaterally. Ambulates without aid, gait steady.     Skin:    General: Skin is warm and dry.     Capillary Refill: Capillary refill takes less than 2 seconds.  Neurological:     General: No focal deficit present.     Mental Status: She is alert and oriented to person, place, and time.  Psychiatric:        Mood and Affect: Mood normal.        Behavior: Behavior normal.     Ortho Exam  Imaging: No results found.  Past Medical/Family/Surgical/Social History: Medications & Allergies reviewed per EMR, new medications updated. Patient Active Problem  List   Diagnosis Date Noted   Dizzy 03/03/2023   Decreased libido 11/10/2021   Prediabetes 01/03/2021   Familial hypercholesterolemia 02/22/2020   Essential hypertension 04/09/2019   HLD (hyperlipidemia) 04/09/2019   Irritable bowel syndrome (IBS) 04/09/2019   Carpal tunnel syndrome 10/16/2017   Anxiety and depression 10/16/2017   Past Medical History:  Diagnosis Date   Allergy    SEASONAL   Anxiety    Arthritis    Depression    Disc degeneration, lumbar 2011   Heartburn    Hyperlipidemia    Hypertension    IBS (irritable bowel syndrome)    Family History  Problem Relation Age of Onset   Breast cancer Mother 30       ER+   Thyroid disease Mother    Hyperlipidemia Mother    Diabetes Father    Hypertension Father        and aunt and uncles   Thyroid  disease Father    Hyperlipidemia Father    Breast cancer Maternal Aunt 70   Hypertension Paternal Grandfather    Prostate cancer Paternal Grandfather    Stomach cancer Cousin    Colon cancer Neg Hx    Colon polyps Neg Hx    Esophageal cancer Neg Hx    Rectal cancer Neg Hx    Past Surgical History:  Procedure Laterality Date   RIGHT HEART CATH N/A 06/12/2020   Procedure: RIGHT HEART CATH;  Surgeon: Cherrie Toribio SAUNDERS, MD;  Location: [facility];  Service: Cardiovascular;  Laterality: N/A;   TUBAL LIGATION     Social History   Occupational History   Occupation: company secretary  Tobacco Use   Smoking status: Never    Passive exposure: Never   Smokeless tobacco: Never  Vaping Use   Vaping status: Never Used  Substance and Sexual Activity   Alcohol use: Yes    Comment: social   Drug use: No   Sexual activity: Yes    Partners: Male    Birth control/protection: Surgical, None    Comment: BTL    "

## 2023-12-16 ENCOUNTER — Telehealth: Payer: Self-pay

## 2023-12-16 ENCOUNTER — Other Ambulatory Visit (HOSPITAL_BASED_OUTPATIENT_CLINIC_OR_DEPARTMENT_OTHER): Payer: Self-pay

## 2023-12-16 ENCOUNTER — Other Ambulatory Visit: Payer: Self-pay | Admitting: Physical Medicine and Rehabilitation

## 2023-12-16 MED ORDER — DIAZEPAM 5 MG PO TABS
ORAL_TABLET | ORAL | 0 refills | Status: DC
  Filled 2023-12-16: qty 1, 1d supply, fill #0

## 2023-12-16 NOTE — Telephone Encounter (Signed)
 Pre procedural Valium

## 2023-12-22 ENCOUNTER — Encounter: Payer: Self-pay | Admitting: Radiology

## 2023-12-26 ENCOUNTER — Other Ambulatory Visit (HOSPITAL_BASED_OUTPATIENT_CLINIC_OR_DEPARTMENT_OTHER): Payer: Self-pay

## 2024-01-02 ENCOUNTER — Encounter: Payer: Self-pay | Admitting: Physical Medicine and Rehabilitation

## 2024-01-02 ENCOUNTER — Other Ambulatory Visit: Payer: Self-pay

## 2024-01-02 ENCOUNTER — Ambulatory Visit: Admitting: Physical Medicine and Rehabilitation

## 2024-01-02 VITALS — BP 118/85 | HR 102

## 2024-01-02 DIAGNOSIS — M47816 Spondylosis without myelopathy or radiculopathy, lumbar region: Secondary | ICD-10-CM

## 2024-01-02 DIAGNOSIS — M4316 Spondylolisthesis, lumbar region: Secondary | ICD-10-CM

## 2024-01-02 MED ORDER — BUPIVACAINE HCL 0.5 % IJ SOLN
3.0000 mL | Freq: Once | INTRAMUSCULAR | Status: AC
  Administered 2024-01-02: 3 mL

## 2024-01-02 NOTE — Progress Notes (Signed)
 Pain Scale   Average Pain 6 Patient advising she has chronic lower back pain radiating bilaterally to both hip areas. Patient advising her pain increase when she is walking and standing, pain decreases when sitting and resting.        +Driver, -BT, -Dye Allergies.

## 2024-01-06 NOTE — Progress Notes (Addendum)
 "  Brandy [name] - 53 y.o. female MRN [id number]  Date of birth: 05/08/1970  Office Visit Note: Visit Date: 01/02/2024 PCP: Delbert Clam, MD Referred by: Delbert Clam, MD  Subjective: Chief Complaint  Patient presents with   Lower Back - Pain   HPI:  Brandy [name] is a 53 y.o. female who comes in today at the request of Duwaine Pouch, FNP for planned Bilateral  L4-5 and L5-S1 Lumbar facet/medial branch block with fluoroscopic guidance.  The patient has failed conservative care including home exercise, medications, time and activity modification.  This injection will be diagnostic and hopefully therapeutic.  Please see requesting physician notes for further details and justification.  Exam has shown concordant pain with facet joint loading.   ROS Otherwise per HPI.  Assessment & Plan: Visit Diagnoses:    ICD-10-CM   1. Spondylosis without myelopathy or radiculopathy, lumbar region  M47.816 XR C-ARM NO REPORT    Nerve Block    bupivacaine  (MARCAINE ) 0.5 % (with pres) injection 3 mL      Plan: No additional findings.   Meds & Orders:  Meds ordered this encounter  Medications   bupivacaine  (MARCAINE ) 0.5 % (with pres) injection 3 mL    Orders Placed This Encounter  Procedures   Nerve Block   XR C-ARM NO REPORT    Follow-up: Return for Review Pain Diary.   Procedures: No procedures performed  Lumbar Diagnostic Facet Joint Nerve Block with Fluoroscopic Guidance   Patient: Brandy [name]      Date of Birth: 01/31/1971 MRN: [id number] PCP: Delbert Clam, MD      Visit Date: 01/02/2024   Universal Protocol:    Date/Time: 11/18/257:19 PM  Consent Given By: the patient  Position: PRONE  Additional Comments: Vital signs were monitored before and after the procedure. Patient was prepped and draped in the usual sterile fashion. The correct patient, procedure, and site was verified.   Injection Procedure Details:   Procedure diagnoses:   1. Spondylosis without myelopathy or radiculopathy, lumbar region      Meds Administered:  Meds ordered this encounter  Medications   bupivacaine  (MARCAINE ) 0.5 % (with pres) injection 3 mL     Laterality: Bilateral  Location/Site: L4-L5, L3 and L4 medial branches and L5-S1, L4 medial branch and L5 dorsal ramus  Needle: 5.0 in., 25 ga.  Short bevel or Quincke spinal needle  Needle Placement: Oblique pedical  Findings:   -Comments: There was excellent flow of contrast along the articular pillars without intravascular flow.  Procedure Details: The fluoroscope beam is vertically oriented in AP and then obliqued 15 to 20 degrees to the ipsilateral side of the desired nerve to achieve the Scotty dog appearance.  The skin over the target area of the junction of the superior articulating process and the transverse process (sacral ala if blocking the L5 dorsal rami) was locally anesthetized with a 1 ml volume of 1% Lidocaine  without Epinephrine.  The spinal needle was inserted and advanced in a trajectory view down to the target.   After contact with periosteum and negative aspirate for blood and CSF, correct placement without intravascular or epidural spread was confirmed by injecting 0.5 ml. of Isovue-250.  A spot radiograph was obtained of this image.    Next, a 0.5 ml. volume of the injectate described above was injected. The needle was then redirected to the other facet joint nerves mentioned above if needed.  Prior to the procedure, the patient was given a Pain  Diary which was completed for baseline measurements.  After the procedure, the patient rated their pain every 30 minutes and will continue rating at this frequency for a total of 5 hours.  The patient has been asked to complete the Diary and return to us  by mail, fax or hand delivered as soon as possible.   Additional Comments:  The patient tolerated the procedure well Dressing: 2 x 2 sterile gauze and Band-Aid     Post-procedure details: Patient was observed during the procedure. Post-procedure instructions were reviewed.  Patient left the clinic in stable condition.   Clinical History: MR LUMBAR SPINE WITHOUT IV CONTRAST   COMPARISON: X-ray 10/30/2023   CLINICAL HISTORY: Low back pain with radiculopathy.   TECHNIQUE: SAG T2, SAG T1, SAG STIR, AX T2, AX T1 without IV contrast.   FINDINGS: There is mild rightward scoliotic curvature of the thoracolumbar spine. Slight compensatory levoscoliosis of the lumbar spine. Mild disc desiccation facet arthrosis most notably at L4-5 and L5-S1. There is no vertebral body height loss, subluxation or marrow replacing process. The sacrum and SI joints are unremarkable so far as visualized. 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 Conus and cauda equina are unremarkable.   T12-L1: There is no focal disc protrusion, foraminal or spinal stenosis.   L1-2: There is no focal disc protrusion, foraminal or spinal stenosis.   L2-3: There is no focal disc protrusion, foraminal or spinal stenosis.   L3-4: Mild broad-based bulge and mild-to-moderate facet arthrosis. There is caudal foraminal narrowing, left greater than right. There is slight effacement of thecal sac. No significant foraminal or spinal stenosis.   L4-5: Mild broad-based disc osteophyte moderate facet arthrosis. There is caudal foraminal narrowing bilaterally without impingement of the exiting nerves. There is slight crowding of the descending nerve roots in lateral recess without impingement.   L5-S1: Mild disc desiccation and mild facet arthrosis. No significant foraminal or spinal stenosis.   The retroperitoneal structures demonstrate no significant abnormality.   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 There is no high-grade foraminal or spinal stenosis. See above for more detail at each individual  level.   No acute abnormality.   Electronically signed by: Norleen Satchel MD 11/29/2023 02:12 PM EDT RP Workstation: [id number]     Objective:  VS:  HT:    WT:   BMI:     BP:118/85  HR:(!) 102bpm  TEMP: ( )  RESP:  Physical Exam Vitals and nursing note reviewed.  Constitutional:      General: She is not in acute distress.    Appearance: Normal appearance. She is not ill-appearing.  HENT:     Head: Normocephalic and atraumatic.     Right Ear: External ear normal.     Left Ear: External ear normal.  Eyes:     Extraocular Movements: Extraocular movements intact.  Cardiovascular:     Rate and Rhythm: Normal rate.     Pulses: Normal pulses.  Pulmonary:     Effort: Pulmonary effort is normal. No respiratory distress.  Abdominal:     General: There is no distension.     Palpations: Abdomen is soft.  Musculoskeletal:        General: Tenderness present.     Cervical back: Neck supple.     Right lower leg: No edema.     Left lower leg: No edema.     Comments: Patient has good distal strength with no pain over the greater  trochanters.  No clonus or focal weakness.  Skin:    Findings: No erythema, lesion or rash.  Neurological:     General: No focal deficit present.     Mental Status: She is alert and oriented to person, place, and time.     Sensory: No sensory deficit.     Motor: No weakness or abnormal muscle tone.     Coordination: Coordination normal.  Psychiatric:        Mood and Affect: Mood normal.        Behavior: Behavior normal.      Imaging: No results found. "

## 2024-01-06 NOTE — Procedures (Addendum)
 Lumbar Diagnostic Facet Joint Nerve Block with Fluoroscopic Guidance   Patient: Brandy [name]      Date of Birth: 09/14/1970 MRN: [id number] PCP: Delbert Clam, MD      Visit Date: 01/02/2024   Universal Protocol:    Date/Time: 11/18/257:19 PM  Consent Given By: the patient  Position: PRONE  Additional Comments: Vital signs were monitored before and after the procedure. Patient was prepped and draped in the usual sterile fashion. The correct patient, procedure, and site was verified.   Injection Procedure Details:   Procedure diagnoses:  1. Spondylosis without myelopathy or radiculopathy, lumbar region      Meds Administered:  Meds ordered this encounter  Medications   bupivacaine  (MARCAINE ) 0.5 % (with pres) injection 3 mL     Laterality: Bilateral  Location/Site: L4-L5, L3 and L4 medial branches and L5-S1, L4 medial branch and L5 dorsal ramus  Needle: 5.0 in., 25 ga.  Short bevel or Quincke spinal needle  Needle Placement: Oblique pedical  Findings:   -Comments: There was excellent flow of contrast along the articular pillars without intravascular flow.  Procedure Details: The fluoroscope beam is vertically oriented in AP and then obliqued 15 to 20 degrees to the ipsilateral side of the desired nerve to achieve the Scotty dog appearance.  The skin over the target area of the junction of the superior articulating process and the transverse process (sacral ala if blocking the L5 dorsal rami) was locally anesthetized with a 1 ml volume of 1% Lidocaine  without Epinephrine.  The spinal needle was inserted and advanced in a trajectory view down to the target.   After contact with periosteum and negative aspirate for blood and CSF, correct placement without intravascular or epidural spread was confirmed by injecting 0.5 ml. of Isovue-250.  A spot radiograph was obtained of this image.    Next, a 0.5 ml. volume of the injectate described above was injected. The  needle was then redirected to the other facet joint nerves mentioned above if needed.  Prior to the procedure, the patient was given a Pain Diary which was completed for baseline measurements.  After the procedure, the patient rated their pain every 30 minutes and will continue rating at this frequency for a total of 5 hours.  The patient has been asked to complete the Diary and return to us  by mail, fax or hand delivered as soon as possible.   Additional Comments:  The patient tolerated the procedure well Dressing: 2 x 2 sterile gauze and Band-Aid    Post-procedure details: Patient was observed during the procedure. Post-procedure instructions were reviewed.  Patient left the clinic in stable condition.

## 2024-01-14 ENCOUNTER — Other Ambulatory Visit: Payer: Self-pay | Admitting: Family Medicine

## 2024-01-19 ENCOUNTER — Telehealth: Payer: Self-pay | Admitting: Physical Medicine and Rehabilitation

## 2024-01-19 NOTE — Telephone Encounter (Signed)
 Pt called requesting an appt with [facility]. Ast appt 01/02/24. Pt number is [phone].

## 2024-02-02 ENCOUNTER — Encounter: Payer: Self-pay | Admitting: Family Medicine

## 2024-02-02 ENCOUNTER — Encounter

## 2024-02-02 ENCOUNTER — Other Ambulatory Visit: Payer: Self-pay | Admitting: Family Medicine

## 2024-02-02 MED ORDER — ALBUTEROL SULFATE HFA 108 (90 BASE) MCG/ACT IN AERS: 2.0000 | INHALATION_SPRAY | Freq: Four times a day (QID) | RESPIRATORY_TRACT | 0 refills | Status: AC | PRN

## 2024-02-02 MED ORDER — PREDNISONE 20 MG PO TABS: 20.0000 mg | ORAL_TABLET | Freq: Every day | ORAL | 0 refills | Status: AC

## 2024-02-09 ENCOUNTER — Other Ambulatory Visit: Payer: Self-pay

## 2024-02-09 ENCOUNTER — Ambulatory Visit: Admitting: Physical Medicine and Rehabilitation

## 2024-02-09 VITALS — BP 147/99 | HR 87

## 2024-02-09 DIAGNOSIS — M47816 Spondylosis without myelopathy or radiculopathy, lumbar region: Secondary | ICD-10-CM

## 2024-02-09 MED ORDER — BUPIVACAINE HCL 0.5 % IJ SOLN
3.0000 mL | Freq: Once | INTRAMUSCULAR | Status: AC
  Administered 2024-02-09: 3 mL

## 2024-02-09 NOTE — Progress Notes (Signed)
 Pain Scale   Average Pain 6 Patient advising she has lower back pain radiating bilateral to buttocks, pain is constant.        +Driver, -BT, -Dye Allergies.

## 2024-02-09 NOTE — Progress Notes (Signed)
 "  Brandy [name] - 53 y.o. female MRN [id number]  Date of birth: 1970/12/19  Office Visit Note: Visit Date: 02/09/2024 PCP: Delbert Clam, MD Referred by: Delbert Clam, MD  Subjective: Chief Complaint  Patient presents with   Lower Back - Pain   HPI:  Brandy [name] is a 53 y.o. female who comes in today for planned repeat Bilateral  L4-5  and L5-S1 Lumbar facet/medial branch block with fluoroscopic guidance.  The patient has failed conservative care including home exercise, medications, time and activity modification.  This injection will be diagnostic and hopefully therapeutic.  Please see requesting physician notes for further details and justification.  Exam shows concordant low back pain with facet joint loading and extension. Patient received more than 80% pain relief from prior injection. This would be the second block in a diagnostic double block paradigm.     Referring:Megan Trudy, FNP   ROS Otherwise per HPI.  Assessment & Plan: Visit Diagnoses:    ICD-10-CM   1. Spondylosis without myelopathy or radiculopathy, lumbar region  M47.816 XR C-ARM NO REPORT    Nerve Block    bupivacaine  (MARCAINE ) 0.5 % (with pres) injection 3 mL      Plan: No additional findings.   Meds & Orders:  Meds ordered this encounter  Medications   bupivacaine  (MARCAINE ) 0.5 % (with pres) injection 3 mL    Orders Placed This Encounter  Procedures   Nerve Block   XR C-ARM NO REPORT    Follow-up: No follow-ups on file.   Procedures: No procedures performed      Clinical History: MR LUMBAR SPINE WITHOUT IV CONTRAST   COMPARISON: X-ray 10/30/2023   CLINICAL HISTORY: Low back pain with radiculopathy.   TECHNIQUE: SAG T2, SAG T1, SAG STIR, AX T2, AX T1 without IV contrast.   FINDINGS: There is mild rightward scoliotic curvature of the thoracolumbar spine. Slight compensatory levoscoliosis of the lumbar spine. Mild disc desiccation facet arthrosis most  notably at L4-5 and L5-S1. There is no vertebral body height loss, subluxation or marrow replacing process. The sacrum and SI joints are unremarkable so far as visualized. 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 Conus and cauda equina are unremarkable.   T12-L1: There is no focal disc protrusion, foraminal or spinal stenosis.   L1-2: There is no focal disc protrusion, foraminal or spinal stenosis.   L2-3: There is no focal disc protrusion, foraminal or spinal stenosis.   L3-4: Mild broad-based bulge and mild-to-moderate facet arthrosis. There is caudal foraminal narrowing, left greater than right. There is slight effacement of thecal sac. No significant foraminal or spinal stenosis.   L4-5: Mild broad-based disc osteophyte moderate facet arthrosis. There is caudal foraminal narrowing bilaterally without impingement of the exiting nerves. There is slight crowding of the descending nerve roots in lateral recess without impingement.   L5-S1: Mild disc desiccation and mild facet arthrosis. No significant foraminal or spinal stenosis.   The retroperitoneal structures demonstrate no significant abnormality.   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 There is no high-grade foraminal or spinal stenosis. See above for more detail at each individual level.   No acute abnormality.   Electronically signed by: Norleen Satchel MD 11/29/2023 02:12 PM EDT RP Workstation: [id number]     Objective:  VS:  HT:    WT:   BMI:     BP:(!) 147/99  HR:87bpm  TEMP: ( )  RESP:  Physical Exam  Vitals and nursing note reviewed.  Constitutional:      General: She is not in acute distress.    Appearance: Normal appearance. She is not ill-appearing.  HENT:     Head: Normocephalic and atraumatic.     Right Ear: External ear normal.     Left Ear: External ear normal.  Eyes:     Extraocular Movements: Extraocular movements  intact.  Cardiovascular:     Rate and Rhythm: Normal rate.     Pulses: Normal pulses.  Pulmonary:     Effort: Pulmonary effort is normal. No respiratory distress.  Abdominal:     General: There is no distension.     Palpations: Abdomen is soft.  Musculoskeletal:        General: Tenderness present.     Cervical back: Neck supple.     Right lower leg: No edema.     Left lower leg: No edema.     Comments: Patient has good distal strength with no pain over the greater trochanters.  No clonus or focal weakness.  Skin:    Findings: No erythema, lesion or rash.  Neurological:     General: No focal deficit present.     Mental Status: She is alert and oriented to person, place, and time.     Sensory: No sensory deficit.     Motor: No weakness or abnormal muscle tone.     Coordination: Coordination normal.  Psychiatric:        Mood and Affect: Mood normal.        Behavior: Behavior normal.      Imaging: No results found. "

## 2024-02-09 NOTE — Procedures (Signed)
 Lumbar Diagnostic Facet Joint Nerve Block with Fluoroscopic Guidance   Patient: Brandy [name]      Date of Birth: May 23, 1970 MRN: [id number] PCP: Delbert Clam, MD      Visit Date: 02/09/2024   Universal Protocol:    Date/Time: 12/22/20252:48 PM  Consent Given By: the patient  Position: PRONE  Additional Comments: Vital signs were monitored before and after the procedure. Patient was prepped and draped in the usual sterile fashion. The correct patient, procedure, and site was verified.   Injection Procedure Details:   Procedure diagnoses:  1. Spondylosis without myelopathy or radiculopathy, lumbar region      Meds Administered:  Meds ordered this encounter  Medications   bupivacaine  (MARCAINE ) 0.5 % (with pres) injection 3 mL     Laterality: Bilateral  Location/Site: L4-L5, L3 and L4 medial branches and L5-S1, L4 medial branch and L5 dorsal ramus  Needle: 5.0 in., 25 ga.  Short bevel or Quincke spinal needle  Needle Placement: Oblique pedical  Findings:   -Comments: There was excellent flow of contrast along the articular pillars without intravascular flow.  Procedure Details: The fluoroscope beam is vertically oriented in AP and then obliqued 15 to 20 degrees to the ipsilateral side of the desired nerve to achieve the Scotty dog appearance.  The skin over the target area of the junction of the superior articulating process and the transverse process (sacral ala if blocking the L5 dorsal rami) was locally anesthetized with a 1 ml volume of 1% Lidocaine  without Epinephrine.  The spinal needle was inserted and advanced in a trajectory view down to the target.   After contact with periosteum and negative aspirate for blood and CSF, correct placement without intravascular or epidural spread was confirmed by injecting 0.5 ml. of Isovue-250.  A spot radiograph was obtained of this image.    Next, a 0.5 ml. volume of the injectate described above was injected.  The needle was then redirected to the other facet joint nerves mentioned above if needed.  Prior to the procedure, the patient was given a Pain Diary which was completed for baseline measurements.  After the procedure, the patient rated their pain every 30 minutes and will continue rating at this frequency for a total of 5 hours.  The patient has been asked to complete the Diary and return to us  by mail, fax or hand delivered as soon as possible.   Additional Comments:  The patient tolerated the procedure well Dressing: 2 x 2 sterile gauze and Band-Aid    Post-procedure details: Patient was observed during the procedure. Post-procedure instructions were reviewed.  Patient left the clinic in stable condition.

## 2024-02-10 ENCOUNTER — Encounter: Payer: Self-pay | Admitting: Physical Medicine and Rehabilitation

## 2024-02-10 ENCOUNTER — Other Ambulatory Visit: Payer: Self-pay | Admitting: Physical Medicine and Rehabilitation

## 2024-02-10 DIAGNOSIS — M545 Low back pain, unspecified: Secondary | ICD-10-CM

## 2024-02-10 DIAGNOSIS — M47816 Spondylosis without myelopathy or radiculopathy, lumbar region: Secondary | ICD-10-CM

## 2024-02-11 ENCOUNTER — Other Ambulatory Visit: Payer: Self-pay | Admitting: Physical Medicine and Rehabilitation

## 2024-02-11 ENCOUNTER — Telehealth: Payer: Self-pay

## 2024-02-11 MED ORDER — DIAZEPAM 5 MG PO TABS: ORAL_TABLET | ORAL | 0 refills | Status: DC

## 2024-02-11 NOTE — Telephone Encounter (Signed)
 Pre procedural Valium  --  01/15 and 01/29

## 2024-03-04 ENCOUNTER — Other Ambulatory Visit: Payer: Self-pay

## 2024-03-04 ENCOUNTER — Ambulatory Visit: Admitting: Physical Medicine and Rehabilitation

## 2024-03-04 VITALS — BP 129/91 | HR 96

## 2024-03-04 DIAGNOSIS — M47816 Spondylosis without myelopathy or radiculopathy, lumbar region: Secondary | ICD-10-CM

## 2024-03-04 MED ORDER — DIAZEPAM 5 MG PO TABS: ORAL_TABLET | ORAL | 0 refills | Status: AC

## 2024-03-04 MED ORDER — METHYLPREDNISOLONE ACETATE 40 MG/ML IJ SUSP
40.0000 mg | Freq: Once | INTRAMUSCULAR | Status: AC
  Administered 2024-03-04: 40 mg

## 2024-03-04 NOTE — Progress Notes (Signed)
 Pain Scale   Average Pain 3, when just resting, 7 with activity    Bil L3-L4 & L4-L5 RFA    +Driver, -BT, -Dye Allergies.

## 2024-03-14 NOTE — Procedures (Signed)
 Lumbar Facet Joint Nerve Denervation  Patient: Brandy [name]      Date of Birth: [birth year] MRN: [id number] PCP: Delbert Clam, MD      Visit Date: 03/04/2024   Universal Protocol:    Date/Time: 01/25/261:14 PM  Consent Given By: the patient  Position: PRONE  Additional Comments: Vital signs were monitored before and after the procedure. Patient was prepped and draped in the usual sterile fashion. The correct patient, procedure, and site was verified.   Injection Procedure Details:   Procedure diagnoses:  1. Spondylosis without myelopathy or radiculopathy, lumbar region      Meds Administered:  Meds ordered this encounter  Medications   methylPREDNISolone  acetate (DEPO-MEDROL ) injection 40 mg   diazepam  (VALIUM ) 5 MG tablet    Sig: Take 1-2 by mouth 1 hour  pre-procedure with very light food. Do not drive motor vehicle.    Dispense:  2 tablet    Refill:  0     Laterality: Right  Location/Site:  L4-L5, L3 and L4 medial branches and L5-S1, L4 medial branch and L5 dorsal ramus  Needle: 18 ga.,  10mm active tip, RF Cannula  Needle Placement: Along juncture of superior articular process and transverse pocess  Findings:  -Comments:  Procedure Details: For each desired target nerve, the corresponding transverse process (sacral ala for the L5 dorsal rami) was identified and the fluoroscope was positioned to square off the endplates of the corresponding vertebral body to achieve a true AP midline view.  The beam was then obliqued 15 to 20 degrees and caudally tilted 15 to 20 degrees to line up a trajectory along the target nerves. The skin over the target of the junction of superior articulating process and transverse process (sacral ala for the L5 dorsal rami) was infiltrated with 1ml of 1% Lidocaine  without Epinephrine.  The 18 gauge 10mm active tip outer cannula was advanced in trajectory view to the target.  This procedure was repeated for each target  nerve.  Then, for all levels, the outer cannula placement was fine-tuned and the position was then confirmed with bi-planar imaging.    Test stimulation was done both at sensory and motor levels to ensure there was no radicular stimulation. The target tissues were then infiltrated with 1 ml of 1% Lidocaine  without Epinephrine. Subsequently, a percutaneous neurotomy was carried out for 90 seconds at 80 degrees Celsius.  After the completion of the lesion, 1 ml of injectate was delivered. It was then repeated for each facet joint nerve mentioned above. Appropriate radiographs were obtained to verify the probe placement during the neurotomy.   Additional Comments:  The patient tolerated the procedure well Dressing: 2 x 2 sterile gauze and Band-Aid    Post-procedure details: Patient was observed during the procedure. Post-procedure instructions were reviewed.  Patient left the clinic in stable condition.

## 2024-03-14 NOTE — Progress Notes (Signed)
 "  Brandy [name] - 54 y.o. female MRN [id number]  Date of birth: 05-Jan-1971  Office Visit Note: Visit Date: 03/04/2024 PCP: Delbert Clam, MD Referred by: Delbert Clam, MD  Subjective: Chief Complaint  Patient presents with   Lower Back - Pain   HPI:  Brandy [name] is a 54 y.o. female who comes in todayfor planned radiofrequency ablation of the Right L4-5 and L5-S1 Lumbar facet joints. This would be ablation of the corresponding medial branches and/or dorsal rami.  Patient has had double diagnostic blocks with more than 50% relief.  These are documented on pain diary.  They have had chronic back pain for quite some time, more than 3 months, which has been an ongoing situation with recalcitrant axial back pain.  They have no radicular pain.  Their axial pain is worse with standing and ambulating and on exam today with facet loading.  They have had physical therapy as well as home exercise program.  The imaging noted in the chart below indicated facet pathology. Accordingly they meet all the criteria and qualification for for radiofrequency ablation and we are going to complete this today hopefully for more longer term relief as part of comprehensive management program.  *Please note that the prior levels were mislabeled on fluoroscopy and we are doing the ablation and medial branch blocks at L4-5 and L5-S1.  She has a partially sacralized L5 segment.   ROS Otherwise per HPI.  Assessment & Plan: Visit Diagnoses:    ICD-10-CM   1. Spondylosis without myelopathy or radiculopathy, lumbar region  M47.816 XR C-ARM NO REPORT    Radiofrequency,Lumbar    methylPREDNISolone  acetate (DEPO-MEDROL ) injection 40 mg      Plan: No additional findings.   Meds & Orders:  Meds ordered this encounter  Medications   methylPREDNISolone  acetate (DEPO-MEDROL ) injection 40 mg   diazepam  (VALIUM ) 5 MG tablet    Sig: Take 1-2 by mouth 1 hour  pre-procedure with very light food. Do not  drive motor vehicle.    Dispense:  2 tablet    Refill:  0    Orders Placed This Encounter  Procedures   Radiofrequency,Lumbar   XR C-ARM NO REPORT    Follow-up: Return for visit to requesting provider as needed.   Procedures: No procedures performed  Lumbar Facet Joint Nerve Denervation  Patient: Brandy [name]      Date of Birth: October 15, 1970 MRN: [id number] PCP: Delbert Clam, MD      Visit Date: 03/04/2024   Universal Protocol:    Date/Time: 01/25/261:14 PM  Consent Given By: the patient  Position: PRONE  Additional Comments: Vital signs were monitored before and after the procedure. Patient was prepped and draped in the usual sterile fashion. The correct patient, procedure, and site was verified.   Injection Procedure Details:   Procedure diagnoses:  1. Spondylosis without myelopathy or radiculopathy, lumbar region      Meds Administered:  Meds ordered this encounter  Medications   methylPREDNISolone  acetate (DEPO-MEDROL ) injection 40 mg   diazepam  (VALIUM ) 5 MG tablet    Sig: Take 1-2 by mouth 1 hour  pre-procedure with very light food. Do not drive motor vehicle.    Dispense:  2 tablet    Refill:  0     Laterality: Right  Location/Site:  L4-L5, L3 and L4 medial branches and L5-S1, L4 medial branch and L5 dorsal ramus  Needle: 18 ga.,  10mm active tip, RF Cannula  Needle Placement: Along juncture of superior  articular process and transverse pocess  Findings:  -Comments:  Procedure Details: For each desired target nerve, the corresponding transverse process (sacral ala for the L5 dorsal rami) was identified and the fluoroscope was positioned to square off the endplates of the corresponding vertebral body to achieve a true AP midline view.  The beam was then obliqued 15 to 20 degrees and caudally tilted 15 to 20 degrees to line up a trajectory along the target nerves. The skin over the target of the junction of superior articulating process  and transverse process (sacral ala for the L5 dorsal rami) was infiltrated with 1ml of 1% Lidocaine  without Epinephrine.  The 18 gauge 10mm active tip outer cannula was advanced in trajectory view to the target.  This procedure was repeated for each target nerve.  Then, for all levels, the outer cannula placement was fine-tuned and the position was then confirmed with bi-planar imaging.    Test stimulation was done both at sensory and motor levels to ensure there was no radicular stimulation. The target tissues were then infiltrated with 1 ml of 1% Lidocaine  without Epinephrine. Subsequently, a percutaneous neurotomy was carried out for 90 seconds at 80 degrees Celsius.  After the completion of the lesion, 1 ml of injectate was delivered. It was then repeated for each facet joint nerve mentioned above. Appropriate radiographs were obtained to verify the probe placement during the neurotomy.   Additional Comments:  The patient tolerated the procedure well Dressing: 2 x 2 sterile gauze and Band-Aid    Post-procedure details: Patient was observed during the procedure. Post-procedure instructions were reviewed.  Patient left the clinic in stable condition.      Clinical History: MR LUMBAR SPINE WITHOUT IV CONTRAST   COMPARISON: X-ray 10/30/2023   CLINICAL HISTORY: Low back pain with radiculopathy.   TECHNIQUE: SAG T2, SAG T1, SAG STIR, AX T2, AX T1 without IV contrast.   FINDINGS: There is mild rightward scoliotic curvature of the thoracolumbar spine. Slight compensatory levoscoliosis of the lumbar spine. Mild disc desiccation facet arthrosis most notably at L4-5 and L5-S1. There is no vertebral body height loss, subluxation or marrow replacing process. The sacrum and SI joints are unremarkable so far as visualized. 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 Conus and cauda equina are unremarkable.   T12-L1: There is no focal disc protrusion, foraminal or spinal  stenosis.   L1-2: There is no focal disc protrusion, foraminal or spinal stenosis.   L2-3: There is no focal disc protrusion, foraminal or spinal stenosis.   L3-4: Mild broad-based bulge and mild-to-moderate facet arthrosis. There is caudal foraminal narrowing, left greater than right. There is slight effacement of thecal sac. No significant foraminal or spinal stenosis.   L4-5: Mild broad-based disc osteophyte moderate facet arthrosis. There is caudal foraminal narrowing bilaterally without impingement of the exiting nerves. There is slight crowding of the descending nerve roots in lateral recess without impingement.   L5-S1: Mild disc desiccation and mild facet arthrosis. No significant foraminal or spinal stenosis.   The retroperitoneal structures demonstrate no significant abnormality.   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 There is no high-grade foraminal or spinal stenosis. See above for more detail at each individual level.   No acute abnormality.   Electronically signed by: Norleen Satchel MD 11/29/2023 02:12 PM EDT RP Workstation: [id number]     Objective:  VS:  HT:    WT:   BMI:  BP:(!) 129/91  HR:96bpm  TEMP: ( )  RESP:  Physical Exam Vitals and nursing note reviewed.  Constitutional:      General: She is not in acute distress.    Appearance: Normal appearance. She is not ill-appearing.  HENT:     Head: Normocephalic and atraumatic.     Right Ear: External ear normal.     Left Ear: External ear normal.  Eyes:     Extraocular Movements: Extraocular movements intact.  Cardiovascular:     Rate and Rhythm: Normal rate.     Pulses: Normal pulses.  Pulmonary:     Effort: Pulmonary effort is normal. No respiratory distress.  Abdominal:     General: There is no distension.     Palpations: Abdomen is soft.  Musculoskeletal:        General: Tenderness present.     Cervical back: Neck  supple.     Right lower leg: No edema.     Left lower leg: No edema.     Comments: Patient has good distal strength with no pain over the greater trochanters.  No clonus or focal weakness.  Skin:    Findings: No erythema, lesion or rash.  Neurological:     General: No focal deficit present.     Mental Status: She is alert and oriented to person, place, and time.     Sensory: No sensory deficit.     Motor: No weakness or abnormal muscle tone.     Coordination: Coordination normal.  Psychiatric:        Mood and Affect: Mood normal.        Behavior: Behavior normal.      Imaging: No results found. "

## 2024-03-18 ENCOUNTER — Other Ambulatory Visit: Payer: Self-pay

## 2024-03-18 ENCOUNTER — Ambulatory Visit: Admitting: Physical Medicine and Rehabilitation

## 2024-03-18 VITALS — BP 121/85 | HR 80

## 2024-03-18 DIAGNOSIS — M47816 Spondylosis without myelopathy or radiculopathy, lumbar region: Secondary | ICD-10-CM

## 2024-03-18 MED ORDER — METHYLPREDNISOLONE ACETATE 80 MG/ML IJ SUSP
80.0000 mg | Freq: Once | INTRAMUSCULAR | Status: AC
  Administered 2024-03-18: 80 mg

## 2024-03-18 NOTE — Progress Notes (Signed)
 Pain Scale   Average Pain 9 Patient advising she has lower back pain that is constant, with no relief.        +Driver, -BT, -Dye Allergies.

## 2024-03-22 NOTE — Procedures (Signed)
 Lumbar Facet Joint Nerve Denervation  Patient: Brandy [name]      Date of Birth: 12-24-1970 MRN: [id number] PCP: Delbert Clam, MD      Visit Date: 03/18/2024   Universal Protocol:    Date/Time: 02/02/266:21 PM  Consent Given By: the patient  Position: PRONE  Additional Comments: Vital signs were monitored before and after the procedure. Patient was prepped and draped in the usual sterile fashion. The correct patient, procedure, and site was verified.   Injection Procedure Details:   Procedure diagnoses:  1. Spondylosis without myelopathy or radiculopathy, lumbar region      Meds Administered:  Meds ordered this encounter  Medications   methylPREDNISolone  acetate (DEPO-MEDROL ) injection 80 mg     Laterality: Left  Location/Site:  L4-L5, L3 and L4 medial branches and L5-S1, L4 medial branch and L5 dorsal ramus  Needle: 18 ga.,  10mm active tip, RF Cannula  Needle Placement: Along juncture of superior articular process and transverse pocess  Findings:  -Comments:  Procedure Details: For each desired target nerve, the corresponding transverse process (sacral ala for the L5 dorsal rami) was identified and the fluoroscope was positioned to square off the endplates of the corresponding vertebral body to achieve a true AP midline view.  The beam was then obliqued 15 to 20 degrees and caudally tilted 15 to 20 degrees to line up a trajectory along the target nerves. The skin over the target of the junction of superior articulating process and transverse process (sacral ala for the L5 dorsal rami) was infiltrated with 1ml of 1% Lidocaine  without Epinephrine.  The 18 gauge 10mm active tip outer cannula was advanced in trajectory view to the target.  This procedure was repeated for each target nerve.  Then, for all levels, the outer cannula placement was fine-tuned and the position was then confirmed with bi-planar imaging.    Test stimulation was done both at  sensory and motor levels to ensure there was no radicular stimulation. The target tissues were then infiltrated with 1 ml of 1% Lidocaine  without Epinephrine. Subsequently, a percutaneous neurotomy was carried out for 90 seconds at 80 degrees Celsius.  After the completion of the lesion, 1 ml of injectate was delivered. It was then repeated for each facet joint nerve mentioned above. Appropriate radiographs were obtained to verify the probe placement during the neurotomy.   Additional Comments:  The patient tolerated the procedure well Dressing: 2 x 2 sterile gauze and Band-Aid    Post-procedure details: Patient was observed during the procedure. Post-procedure instructions were reviewed.  Patient left the clinic in stable condition.

## 2024-03-23 ENCOUNTER — Other Ambulatory Visit: Payer: Self-pay | Admitting: Family Medicine

## 2024-05-11 ENCOUNTER — Ambulatory Visit: Admitting: Family Medicine
